# Patient Record
Sex: Female | Born: 1965
Health system: Southern US, Community
[De-identification: ages and names within clinical notes are randomized; demographics above are authoritative.]

## PROBLEM LIST (undated history)

## (undated) DIAGNOSIS — N183 Chronic kidney disease, stage 3 unspecified: Secondary | ICD-10-CM

## (undated) DIAGNOSIS — Z720 Tobacco use: Secondary | ICD-10-CM

## (undated) DIAGNOSIS — F419 Anxiety disorder, unspecified: Secondary | ICD-10-CM

## (undated) DIAGNOSIS — I639 Cerebral infarction, unspecified: Secondary | ICD-10-CM

## (undated) DIAGNOSIS — I429 Cardiomyopathy, unspecified: Secondary | ICD-10-CM

## (undated) DIAGNOSIS — K219 Gastro-esophageal reflux disease without esophagitis: Secondary | ICD-10-CM

## (undated) DIAGNOSIS — F329 Major depressive disorder, single episode, unspecified: Secondary | ICD-10-CM

## (undated) DIAGNOSIS — F32A Depression, unspecified: Secondary | ICD-10-CM

## (undated) DIAGNOSIS — J45909 Unspecified asthma, uncomplicated: Secondary | ICD-10-CM

## (undated) DIAGNOSIS — I10 Essential (primary) hypertension: Secondary | ICD-10-CM

## (undated) DIAGNOSIS — J189 Pneumonia, unspecified organism: Secondary | ICD-10-CM

## (undated) DIAGNOSIS — M1990 Unspecified osteoarthritis, unspecified site: Secondary | ICD-10-CM

## (undated) HISTORY — DX: Cerebral infarction, unspecified: I63.9

## (undated) HISTORY — DX: Chronic kidney disease, stage 3 (moderate): N18.3

## (undated) HISTORY — DX: Cardiomyopathy, unspecified: I42.9

## (undated) HISTORY — DX: Tobacco use: Z72.0

## (undated) HISTORY — PX: ABDOMINAL HYSTERECTOMY: SHX81

## (undated) HISTORY — DX: Morbid (severe) obesity due to excess calories: E66.01

## (undated) HISTORY — DX: Chronic kidney disease, stage 3 unspecified: N18.30

---

## 2012-08-21 ENCOUNTER — Encounter (HOSPITAL_COMMUNITY): Payer: Self-pay | Admitting: *Deleted

## 2012-08-21 ENCOUNTER — Emergency Department (HOSPITAL_COMMUNITY)
Admission: EM | Admit: 2012-08-21 | Discharge: 2012-08-21 | Disposition: A | Discharge status: Home / Self Care | Payer: Medicaid Other | Attending: Emergency Medicine | Admitting: Emergency Medicine

## 2012-08-21 DIAGNOSIS — F17200 Nicotine dependence, unspecified, uncomplicated: Secondary | ICD-10-CM | POA: Insufficient documentation

## 2012-08-21 DIAGNOSIS — X58XXXA Exposure to other specified factors, initial encounter: Secondary | ICD-10-CM | POA: Insufficient documentation

## 2012-08-21 DIAGNOSIS — J45909 Unspecified asthma, uncomplicated: Secondary | ICD-10-CM | POA: Insufficient documentation

## 2012-08-21 DIAGNOSIS — S0500XA Injury of conjunctiva and corneal abrasion without foreign body, unspecified eye, initial encounter: Secondary | ICD-10-CM

## 2012-08-21 DIAGNOSIS — S058X9A Other injuries of unspecified eye and orbit, initial encounter: Secondary | ICD-10-CM | POA: Insufficient documentation

## 2012-08-21 DIAGNOSIS — Z888 Allergy status to other drugs, medicaments and biological substances status: Secondary | ICD-10-CM | POA: Insufficient documentation

## 2012-08-21 DIAGNOSIS — I10 Essential (primary) hypertension: Secondary | ICD-10-CM | POA: Insufficient documentation

## 2012-08-21 HISTORY — DX: Essential (primary) hypertension: I10

## 2012-08-21 HISTORY — DX: Unspecified asthma, uncomplicated: J45.909

## 2012-08-21 MED ORDER — TETRACAINE HCL 0.5 % OP SOLN
2.0000 [drp] | Freq: Once | OPHTHALMIC | Status: AC
  Administered 2012-08-21: 2 [drp] via OPHTHALMIC
  Filled 2012-08-21: qty 2

## 2012-08-21 MED ORDER — HYDROCODONE-ACETAMINOPHEN 5-325 MG PO TABS
1.0000 | ORAL_TABLET | Freq: Once | ORAL | Status: AC
  Administered 2012-08-21: 1 via ORAL
  Filled 2012-08-21: qty 1

## 2012-08-21 MED ORDER — FLUORESCEIN SODIUM 1 MG OP STRP
1.0000 | ORAL_STRIP | Freq: Once | OPHTHALMIC | Status: AC
  Administered 2012-08-21: 1 via OPHTHALMIC
  Filled 2012-08-21: qty 1

## 2012-08-21 MED ORDER — HYDROCODONE-ACETAMINOPHEN 5-325 MG PO TABS: 1.0000 | ORAL_TABLET | ORAL | Status: DC | PRN

## 2012-08-21 MED ORDER — TOBRAMYCIN 0.3 % OP SOLN
2.0000 [drp] | Freq: Four times a day (QID) | OPHTHALMIC | Status: DC
  Administered 2012-08-21: 2 [drp] via OPHTHALMIC
  Filled 2012-08-21: qty 5

## 2012-08-21 NOTE — ED Notes (Signed)
Pt c/o left eye pain and irritation since yesterday, worsened tonight and woke her from sleep

## 2012-08-21 NOTE — ED Provider Notes (Signed)
Medical screening examination/treatment/procedure(s) were performed by non-physician practitioner and as supervising physician I was immediately available for consultation/collaboration.  Olivia Mackie, MD 08/21/12 [phone]

## 2012-08-21 NOTE — ED Provider Notes (Signed)
History     CSN: [id number]  Arrival date & time 08/21/12  0405   First MD Initiated Contact with Patient 08/21/12 [phone]      Chief Complaint  Patient presents with  . Eye Pain   HPI  History provided by the patient. Patient is a 46 year old female with history of hypertension and asthma who presents with complaints of left eye pain and irritation. Patient reports having some irritation of her left eye yesterday with slight itching. She states she does remember rubbing and scratching at her eye. Later in the evening she began having increasing pain to her and redness. Pain is worse with any bright lights. Patient states she was not sure what might cause her symptoms but that it could of been a strong air pressure in the car or possibly from her mascara. Patient does wear glasses does not use contact lenses. She has associated tearing and drainage. She denies any other aggravating or alleviating factors. There is some blurred vision from drainage and pain.      Past Medical History  Diagnosis Date  . Hypertension   . Asthma     Past Surgical History  Procedure Date  . Abdominal hysterectomy   . Cesarean section     History reviewed. No pertinent family history.  History  Substance Use Topics  . Smoking status: Current Every Day Smoker -- 1.0 packs/day    Types: Cigarettes  . Smokeless tobacco: Not on file  . Alcohol Use: Yes     occ    OB History    Grav Para Term Preterm Abortions TAB SAB Ect Mult Living                  Review of Systems  Constitutional: Negative for fever and chills.  HENT: Positive for rhinorrhea. Negative for congestion and sore throat.   Eyes: Positive for photophobia, pain, discharge, redness and itching. Negative for visual disturbance.  Gastrointestinal: Negative for nausea, vomiting and abdominal pain.  Neurological: Positive for headaches.    Allergies  Altace and Aspirin  Home Medications   Current Outpatient Rx  Name Route  Sig Dispense Refill  . PRESCRIPTION MEDICATION Oral Take 1 tablet by mouth 2 (two) times daily. Patient takes metoprolol and doesn't know what strength.  She doesn't have a pharmacy she uses regularly      BP 186/106  Pulse 77  Temp 98 F (36.7 C)  Resp 20  SpO2 100%  Physical Exam  Nursing note and vitals reviewed. Constitutional: She is oriented to person, place, and time. She appears well-developed and well-nourished. No distress.  HENT:  Head: Normocephalic.  Eyes: EOM are normal. Pupils are equal, round, and reactive to light. No foreign bodies found. Left eye exhibits discharge. Left eye exhibits no chemosis. Right conjunctiva is not injected. Right conjunctiva has no hemorrhage. Left conjunctiva is injected. Left conjunctiva has no hemorrhage.  Slit lamp exam:      The left eye shows fluorescein uptake.         General hazyness over central left cornea with fluorescein uptake.  Cardiovascular: Normal rate and regular rhythm.   No murmur heard. Pulmonary/Chest: Effort normal. No respiratory distress. She has no wheezes. She has no rales.  Neurological: She is alert and oriented to person, place, and time.  Skin: Skin is warm and dry. No rash noted.  Psychiatric: She has a normal mood and affect. Her behavior is normal.    ED Course  Procedures  1. Corneal abrasion       MDM  4:20AM patient seen and evaluated. Patient appears uncomfortable with irritation of left eye.        Angus Seller, [place] 08/21/12 [phone]

## 2012-11-09 ENCOUNTER — Encounter (HOSPITAL_COMMUNITY): Payer: Self-pay | Admitting: *Deleted

## 2012-11-09 ENCOUNTER — Emergency Department (HOSPITAL_COMMUNITY)
Admission: EM | Admit: 2012-11-09 | Discharge: 2012-11-09 | Disposition: A | Discharge status: Home / Self Care | Payer: Medicaid Other | Attending: Emergency Medicine | Admitting: Emergency Medicine

## 2012-11-09 DIAGNOSIS — Z79899 Other long term (current) drug therapy: Secondary | ICD-10-CM | POA: Insufficient documentation

## 2012-11-09 DIAGNOSIS — J45901 Unspecified asthma with (acute) exacerbation: Secondary | ICD-10-CM

## 2012-11-09 DIAGNOSIS — F17200 Nicotine dependence, unspecified, uncomplicated: Secondary | ICD-10-CM | POA: Insufficient documentation

## 2012-11-09 DIAGNOSIS — I10 Essential (primary) hypertension: Secondary | ICD-10-CM | POA: Insufficient documentation

## 2012-11-09 MED ORDER — ALBUTEROL SULFATE HFA 108 (90 BASE) MCG/ACT IN AERS
2.0000 | INHALATION_SPRAY | RESPIRATORY_TRACT | Status: DC | PRN
  Administered 2012-11-09: 2 via RESPIRATORY_TRACT
  Filled 2012-11-09: qty 6.7

## 2012-11-09 MED ORDER — AEROCHAMBER PLUS W/MASK MISC
Status: AC
  Administered 2012-11-09: 12:00:00
  Filled 2012-11-09: qty 1

## 2012-11-09 MED ORDER — ALBUTEROL SULFATE (5 MG/ML) 0.5% IN NEBU
5.0000 mg | INHALATION_SOLUTION | Freq: Once | RESPIRATORY_TRACT | Status: AC
  Administered 2012-11-09: 5 mg via RESPIRATORY_TRACT

## 2012-11-09 MED ORDER — ALBUTEROL SULFATE (5 MG/ML) 0.5% IN NEBU
INHALATION_SOLUTION | RESPIRATORY_TRACT | Status: AC
  Filled 2012-11-09: qty 1

## 2012-11-09 MED ORDER — PREDNISONE 20 MG PO TABS
60.0000 mg | ORAL_TABLET | Freq: Once | ORAL | Status: AC
  Administered 2012-11-09: 60 mg via ORAL
  Filled 2012-11-09: qty 3

## 2012-11-09 MED ORDER — OXYCODONE-ACETAMINOPHEN 5-325 MG PO TABS: 1.0000 | ORAL_TABLET | ORAL | Status: DC | PRN

## 2012-11-09 MED ORDER — ALBUTEROL SULFATE (2.5 MG/3ML) 0.083% IN NEBU: 5.0000 mg | INHALATION_SOLUTION | Freq: Four times a day (QID) | RESPIRATORY_TRACT | Status: DC | PRN

## 2012-11-09 MED ORDER — PREDNISONE 20 MG PO TABS: 40.0000 mg | ORAL_TABLET | Freq: Every day | ORAL | Status: DC

## 2012-11-09 NOTE — ED Provider Notes (Signed)
History     CSN: [id number]  Arrival date & time 11/09/12  1478   First MD Initiated Contact with Patient 11/09/12 [phone]      Chief Complaint  Patient presents with  . Asthma    (Consider location/radiation/quality/duration/timing/severity/associated sxs/prior treatment) Patient is a 46 y.o. female presenting with asthma. The history is provided by the patient. No language interpreter was used.  Asthma This is a recurrent problem. The current episode started yesterday. The problem occurs 2 to 4 times per day. The problem has been gradually worsening. Pertinent negatives include no abdominal pain, coughing, fever, nausea, sore throat, swollen glands or vomiting.   46 year old female coming in with her usual asthma attack. States that she had a restless night and started wheezing sometimes late afternoon yesterday. She's been using her albuterol inhaler with some relief. Denies fever cough nausea vomiting. Smoker. Hypertensive. States that she takes 1 Toprol a day and she supposed to be taking 2 a day. Taking 2 toprol makes her feel "weird". She will be going to the [facility] on January 9th.   Past Medical History  Diagnosis Date  . Hypertension   . Asthma     Past Surgical History  Procedure Date  . Abdominal hysterectomy   . Cesarean section     History reviewed. No pertinent family history.  History  Substance Use Topics  . Smoking status: Current Every Day Smoker -- 1.0 packs/day    Types: Cigarettes  . Smokeless tobacco: Not on file  . Alcohol Use: Yes     Comment: occ    OB History    Grav Para Term Preterm Abortions TAB SAB Ect Mult Living                  Review of Systems  Constitutional: Negative.  Negative for fever.  HENT: Negative.  Negative for sore throat.   Eyes: Negative.   Respiratory: Positive for wheezing. Negative for cough and shortness of breath.   Cardiovascular: Negative.   Gastrointestinal: Negative.  Negative for nausea, vomiting and  abdominal pain.  Neurological: Negative.   Psychiatric/Behavioral: Negative.   All other systems reviewed and are negative.    Allergies  Altace and Aspirin  Home Medications   Current Outpatient Rx  Name  Route  Sig  Dispense  Refill  . ALBUTEROL SULFATE HFA 108 (90 BASE) MCG/ACT IN AERS   Inhalation   Inhale 2 puffs into the lungs every 6 (six) hours as needed. For pain         . METOPROLOL SUCCINATE ER 100 MG PO TB24   Oral   Take 100 mg by mouth 2 (two) times daily. Take with or immediately following a meal.         . ALBUTEROL SULFATE (2.5 MG/3ML) 0.083% IN NEBU   Nebulization   Take 6 mLs (5 mg total) by nebulization every 6 (six) hours as needed for wheezing.   75 mL   12     BP 186/113  Pulse 84  Temp 98.3 F (36.8 C) (Oral)  Resp 20  SpO2 96%  Physical Exam  Nursing note and vitals reviewed. Constitutional: She is oriented to person, place, and time. She appears well-developed and well-nourished.  HENT:  Head: Normocephalic and atraumatic.  Eyes: Conjunctivae normal and EOM are normal. Pupils are equal, round, and reactive to light.  Neck: Normal range of motion. Neck supple.  Cardiovascular: Normal rate.   Pulmonary/Chest: Effort normal. No respiratory distress. She has wheezes.  Scattered wheezing no acute distress  Abdominal: Soft.  Musculoskeletal: Normal range of motion. She exhibits no edema and no tenderness.  Neurological: She is alert and oriented to person, place, and time. She has normal reflexes.  Skin: Skin is warm and dry.  Psychiatric: She has a normal mood and affect.    ED Course  Procedures (including critical care time)  Labs Reviewed - No data to display No results found.   No diagnosis found.    MDM  Asthma attack. Feels better after neb treatment and prednisone.  rx for prednsone and new albuterol inhaler in the ER.Will follow up with [facility] on the 9th.  She will take her toprol as directed (2 a day  instead of 1 a day) for her hypertension. She understands to return for worsening symptoms.        Remi Haggard, NP 11/10/12 1150

## 2012-11-09 NOTE — ED Notes (Signed)
Reports hx of asthma that has become more severe recently and no relief with inhalers. Airway is intact at triage, able to speak in full sentences.

## 2012-11-10 NOTE — ED Provider Notes (Signed)
Medical screening examination/treatment/procedure(s) were performed by non-physician practitioner and as supervising physician I was immediately available for consultation/collaboration.   Chance Munter L Wahid Holley, MD 11/10/12 1743 

## 2012-12-31 ENCOUNTER — Encounter (HOSPITAL_COMMUNITY): Payer: Self-pay | Admitting: Emergency Medicine

## 2012-12-31 DIAGNOSIS — J3489 Other specified disorders of nose and nasal sinuses: Secondary | ICD-10-CM | POA: Insufficient documentation

## 2012-12-31 DIAGNOSIS — J45909 Unspecified asthma, uncomplicated: Secondary | ICD-10-CM | POA: Insufficient documentation

## 2012-12-31 DIAGNOSIS — R0602 Shortness of breath: Principal | ICD-10-CM | POA: Insufficient documentation

## 2012-12-31 DIAGNOSIS — F17200 Nicotine dependence, unspecified, uncomplicated: Secondary | ICD-10-CM | POA: Insufficient documentation

## 2012-12-31 DIAGNOSIS — I10 Essential (primary) hypertension: Secondary | ICD-10-CM | POA: Insufficient documentation

## 2012-12-31 MED ORDER — IPRATROPIUM BROMIDE 0.02 % IN SOLN
0.5000 mg | Freq: Once | RESPIRATORY_TRACT | Status: AC
  Administered 2012-12-31: 0.5 mg via RESPIRATORY_TRACT

## 2012-12-31 MED ORDER — IPRATROPIUM BROMIDE 0.02 % IN SOLN
RESPIRATORY_TRACT | Status: AC
  Administered 2012-12-31: 0.5 mg via RESPIRATORY_TRACT
  Filled 2012-12-31: qty 2.5

## 2012-12-31 MED ORDER — ALBUTEROL SULFATE (5 MG/ML) 0.5% IN NEBU
INHALATION_SOLUTION | RESPIRATORY_TRACT | Status: AC
  Administered 2012-12-31: 5 mg via RESPIRATORY_TRACT
  Filled 2012-12-31: qty 1

## 2012-12-31 MED ORDER — ALBUTEROL SULFATE (5 MG/ML) 0.5% IN NEBU
5.0000 mg | INHALATION_SOLUTION | Freq: Once | RESPIRATORY_TRACT | Status: AC
  Administered 2012-12-31: 5 mg via RESPIRATORY_TRACT

## 2012-12-31 NOTE — ED Notes (Signed)
Patient complaining of shortness of breath, wheezing, chest congestion, cough (productive with small, white sputum production) and nasal congestion that started today.  Denies chest pain.  Reports nausea, vomiting, and diarrhea yesterday.

## 2013-01-01 ENCOUNTER — Emergency Department (HOSPITAL_COMMUNITY)
Admit: 2013-01-01 | Discharge: 2013-01-01 | Disposition: A | Discharge status: Home / Self Care | Payer: Medicaid Other | Attending: Emergency Medicine | Admitting: Emergency Medicine

## 2013-01-01 ENCOUNTER — Telehealth (HOSPITAL_COMMUNITY): Payer: Self-pay | Admitting: Emergency Medicine

## 2013-01-01 ENCOUNTER — Observation Stay (HOSPITAL_COMMUNITY)
Admission: EM | Admit: 2013-01-01 | Discharge: 2013-01-01 | Disposition: A | Discharge status: Home / Self Care | Payer: Medicaid Other | Attending: Emergency Medicine | Admitting: Emergency Medicine

## 2013-01-01 DIAGNOSIS — J189 Pneumonia, unspecified organism: Secondary | ICD-10-CM

## 2013-01-01 LAB — CBC
Hemoglobin: 12.5 g/dL (ref 12.0–15.0)
MCH: 27.6 pg (ref 26.0–34.0)
MCHC: 33.2 g/dL (ref 30.0–36.0)
RDW: 16.5 % — ABNORMAL HIGH (ref 11.5–15.5)

## 2013-01-01 LAB — POCT I-STAT, CHEM 8
Creatinine, Ser: 1.9 mg/dL — ABNORMAL HIGH (ref 0.50–1.10)
Glucose, Bld: 99 mg/dL (ref 70–99)
HCT: 38 % (ref 36.0–46.0)
Hemoglobin: 12.9 g/dL (ref 12.0–15.0)
Potassium: 3.1 mEq/L — ABNORMAL LOW (ref 3.5–5.1)
Sodium: 142 mEq/L (ref 135–145)
TCO2: 26 mmol/L (ref 0–100)

## 2013-01-01 MED ORDER — LEVOFLOXACIN IN D5W 500 MG/100ML IV SOLN
500.0000 mg | Freq: Once | INTRAVENOUS | Status: AC
  Administered 2013-01-01: 500 mg via INTRAVENOUS
  Filled 2013-01-01: qty 100

## 2013-01-01 MED ORDER — SODIUM CHLORIDE 0.9 % IV SOLN: INTRAVENOUS | Status: DC

## 2013-01-01 MED ORDER — METHYLPREDNISOLONE SODIUM SUCC 125 MG IJ SOLR
125.0000 mg | Freq: Once | INTRAMUSCULAR | Status: AC
  Administered 2013-01-01: 125 mg via INTRAVENOUS
  Filled 2013-01-01: qty 2

## 2013-01-01 MED ORDER — PREDNISONE 20 MG PO TABS: 60.0000 mg | ORAL_TABLET | Freq: Every day | ORAL | Status: DC

## 2013-01-01 MED ORDER — ALBUTEROL SULFATE (5 MG/ML) 0.5% IN NEBU
5.0000 mg | INHALATION_SOLUTION | Freq: Once | RESPIRATORY_TRACT | Status: AC
  Administered 2013-01-01: 5 mg via RESPIRATORY_TRACT
  Filled 2013-01-01: qty 1

## 2013-01-01 MED ORDER — MOXIFLOXACIN HCL 400 MG PO TABS: 400.0000 mg | ORAL_TABLET | Freq: Every day | ORAL | Status: DC

## 2013-01-01 NOTE — ED Provider Notes (Addendum)
History     CSN: [id number]  Arrival date & time 12/31/12  2259   First MD Initiated Contact with Patient 01/01/13 [phone]      Chief Complaint  Patient presents with  . Shortness of Breath  . Nasal Congestion    (Consider location/radiation/quality/duration/timing/severity/associated sxs/prior treatment) HPI HX per PT, followed by [facility] clinic for HTN and asthma. Feeling sick over the last 2 days with cough, wheezing and SOB. No sick contacts, no F/C, some congestion. No sore throat, rash or vomiting. Has been using her inhaler more today. No leg swelling. No CP. MOD in severity  Past Medical History  Diagnosis Date  . Hypertension   . Asthma     Past Surgical History  Procedure Laterality Date  . Abdominal hysterectomy    . Cesarean section      History reviewed. No pertinent family history.  History  Substance Use Topics  . Smoking status: Current Every Day Smoker -- 0.50 packs/day    Types: Cigarettes  . Smokeless tobacco: Not on file  . Alcohol Use: No     Comment: occ    OB History   Grav Para Term Preterm Abortions TAB SAB Ect Mult Living                  Review of Systems  Constitutional: Negative for fever and chills.  HENT: Positive for congestion. Negative for neck pain and neck stiffness.   Eyes: Negative for pain.  Respiratory: Positive for cough, shortness of breath and wheezing.   Cardiovascular: Negative for chest pain.  Gastrointestinal: Negative for vomiting and abdominal pain.  Genitourinary: Negative for dysuria.  Musculoskeletal: Negative for back pain.  Skin: Negative for rash.  Neurological: Negative for headaches.  All other systems reviewed and are negative.    Allergies  Altace and Aspirin  Home Medications   Current Outpatient Rx  Name  Route  Sig  Dispense  Refill  . albuterol (PROVENTIL HFA;VENTOLIN HFA) 108 (90 BASE) MCG/ACT inhaler   Inhalation   Inhale 2 puffs into the lungs every 6 (six) hours as needed. For pain          . albuterol (PROVENTIL) (2.5 MG/3ML) 0.083% nebulizer solution   Nebulization   Take 6 mLs (5 mg total) by nebulization every 6 (six) hours as needed for wheezing.   75 mL   12   . metoprolol succinate (TOPROL-XL) 100 MG 24 hr tablet   Oral   Take 100 mg by mouth 2 (two) times daily. Take with or immediately following a meal.         . predniSONE (DELTASONE) 20 MG tablet   Oral   Take 2 tablets (40 mg total) by mouth daily.   10 tablet   0     Dispense as written.     BP 168/79  Pulse 84  Temp(Src) 98.8 F (37.1 C) (Oral)  Resp 14  SpO2 98%  Physical Exam  Constitutional: She is oriented to person, place, and time. She appears well-developed and well-nourished.  HENT:  Head: Normocephalic and atraumatic.  Mouth/Throat: Oropharynx is clear and moist. No oropharyngeal exudate.  Eyes: Conjunctivae and EOM are normal. Pupils are equal, round, and reactive to light.  Neck: Neck supple. No tracheal deviation present.  Cardiovascular: Regular rhythm and intact distal pulses.   Pulmonary/Chest: Effort normal. No stridor. No respiratory distress. She exhibits no tenderness.  Dec bilateral breath sounds with exp wheezes  Abdominal: Soft. Bowel sounds are normal. She exhibits  no distension. There is no tenderness.  Musculoskeletal: Normal range of motion. She exhibits no edema.  Neurological: She is alert and oriented to person, place, and time.  Skin: Skin is warm and dry.    ED Course  Procedures (including critical care time)  Results for orders placed during the hospital encounter of 01/01/13  CBC      Result Value Range   WBC 7.0  4.0 - 10.5 K/uL   RBC 4.53  3.87 - 5.11 MIL/uL   Hemoglobin 12.5  12.0 - 15.0 g/dL   HCT 40.9  81.1 - 91.4 %   MCV 83.2  78.0 - 100.0 fL   MCH 27.6  26.0 - 34.0 pg   MCHC 33.2  30.0 - 36.0 g/dL   RDW 78.2 (*) 95.6 - 21.3 %   Platelets 224  150 - 400 K/uL  POCT I-STAT, CHEM 8      Result Value Range   Sodium 142  135 - 145  mEq/L   Potassium 3.1 (*) 3.5 - 5.1 mEq/L   Chloride 104  96 - 112 mEq/L   BUN 18  6 - 23 mg/dL   Creatinine, Ser 0.86 (*) 0.50 - 1.10 mg/dL   Glucose, Bld 99  70 - 99 mg/dL   Calcium, Ion 5.78 (*) 1.12 - 1.23 mmol/L   TCO2 26  0 - 100 mmol/L   Hemoglobin 12.9  12.0 - 15.0 g/dL   HCT 46.9  62.9 - 52.8 %   Dg Chest 2 View  01/01/2013  *RADIOLOGY REPORT*  Clinical Data: Chest pain, shortness of breath, wheezing, and congestion.  CHEST - 2 VIEW  Comparison: None.  Findings: Cardiac enlargement with normal pulmonary vascularity. There is airspace disease in the right middle lung with obscuration of the right heart border suggesting infiltration or atelectasis. Changes may represent focal pneumonia.  No blunting of costophrenic angles.  No pneumothorax.  No evidence of edema.  Mediastinal contours appear intact.  IMPRESSION: Diffuse cardiac enlargement without significant vascular congestion or edema.  Infiltration or atelectasis in the right mid lung. Consider pneumonia.   Original Report Authenticated By: Burman Nieves, M.D.     IV ABx for infiltrate CXR Albuterol and steroids provided  3:01 AM improved breathing still some exp wheezes, repeat albuterol Tx.   Recheck still some wheezing RA pulse ox 92% with ambulation. MED consult for admit 5:42 AM d/w Dr Julian Reil - plan admit MDM  CAP with wheezing and borderline hypoxia  CXR, labs, breathing Treatments, IV ABx        Sunnie Nielsen, MD 01/01/13 0543   6:10 AM patient prefers not to be admitted to the hospital. She agrees to take antibiotics, steroids and followup with her primary care physician. She agrees to return for any worsening condition  Sunnie Nielsen, MD 01/01/13 [phone]

## 2013-01-01 NOTE — ED Notes (Signed)
Pharmacy call requesting change in Avelox RX, not covered by insurance. T.O. Dr Effie Shy, change RX to Levaquin 750 mg PO daily for seven days, QS, no refills. Levaquin RX called to [facility] [phone] staff @ 1045.

## 2013-11-02 ENCOUNTER — Emergency Department (HOSPITAL_COMMUNITY)
Admission: EM | Admit: 2013-11-02 | Discharge: 2013-11-02 | Disposition: A | Discharge status: Home / Self Care | Payer: Medicaid Other | Source: Home / Self Care | Attending: Family Medicine | Admitting: Family Medicine

## 2013-11-02 ENCOUNTER — Encounter (HOSPITAL_COMMUNITY): Payer: Self-pay | Admitting: Emergency Medicine

## 2013-11-02 DIAGNOSIS — J209 Acute bronchitis, unspecified: Secondary | ICD-10-CM

## 2013-11-02 DIAGNOSIS — J208 Acute bronchitis due to other specified organisms: Secondary | ICD-10-CM

## 2013-11-02 MED ORDER — ALBUTEROL SULFATE (5 MG/ML) 0.5% IN NEBU
INHALATION_SOLUTION | RESPIRATORY_TRACT | Status: AC
  Filled 2013-11-02: qty 1

## 2013-11-02 MED ORDER — ALBUTEROL SULFATE (5 MG/ML) 0.5% IN NEBU
5.0000 mg | INHALATION_SOLUTION | Freq: Once | RESPIRATORY_TRACT | Status: AC
  Administered 2013-11-02: 5 mg via RESPIRATORY_TRACT

## 2013-11-02 MED ORDER — IPRATROPIUM BROMIDE 0.02 % IN SOLN
RESPIRATORY_TRACT | Status: AC
  Filled 2013-11-02: qty 2.5

## 2013-11-02 MED ORDER — PREDNISONE 50 MG PO TABS: 50.0000 mg | ORAL_TABLET | Freq: Every day | ORAL | Status: DC

## 2013-11-02 MED ORDER — ALBUTEROL SULFATE HFA 108 (90 BASE) MCG/ACT IN AERS: 2.0000 | INHALATION_SPRAY | Freq: Four times a day (QID) | RESPIRATORY_TRACT | Status: DC | PRN

## 2013-11-02 MED ORDER — IPRATROPIUM BROMIDE 0.02 % IN SOLN
0.5000 mg | Freq: Once | RESPIRATORY_TRACT | Status: AC
  Administered 2013-11-02: 0.5 mg via RESPIRATORY_TRACT

## 2013-11-02 MED ORDER — HYDROCODONE-ACETAMINOPHEN 5-325 MG PO TABS: 0.5000 | ORAL_TABLET | Freq: Every evening | ORAL | Status: DC | PRN

## 2013-11-02 NOTE — ED Provider Notes (Signed)
Andrea Clements is a 47 y.o. female who presents to Urgent Care today for on the evening of cough congestion shortness of breath and wheezing. This is associated with mild body aches. Patient has not tried any medications yet. The cough is mildly productive of villous sputum. No nausea vomiting or diarrhea. No chest pains or palpitations. Patient has a medical history significant for asthma.   Past Medical History  Diagnosis Date  . Hypertension   . Asthma    History  Substance Use Topics  . Smoking status: Current Every Day Smoker -- 0.50 packs/day    Types: Cigarettes  . Smokeless tobacco: Not on file  . Alcohol Use: No     Comment: occ   ROS as above Medications reviewed. No current facility-administered medications for this encounter.   Current Outpatient Prescriptions  Medication Sig Dispense Refill  . albuterol (PROVENTIL HFA;VENTOLIN HFA) 108 (90 BASE) MCG/ACT inhaler Inhale 2 puffs into the lungs every 6 (six) hours as needed for wheezing or shortness of breath.       Marland Kitchen amLODipine (NORVASC) 5 MG tablet Take 5 mg by mouth daily.      . indapamide (LOZOL) 1.25 MG tablet Take 1.25 mg by mouth daily.      . metoprolol succinate (TOPROL-XL) 100 MG 24 hr tablet Take 100 mg by mouth 2 (two) times daily. Take with or immediately following a meal.      . albuterol (PROVENTIL) (2.5 MG/3ML) 0.083% nebulizer solution Take 6 mLs (5 mg total) by nebulization every 6 (six) hours as needed for wheezing.  75 mL  12  . moxifloxacin (AVELOX) 400 MG tablet Take 1 tablet (400 mg total) by mouth daily.  7 tablet  0  . potassium chloride (K-DUR) 10 MEQ tablet Take 10 mEq by mouth daily.      . predniSONE (DELTASONE) 20 MG tablet Take 3 tablets (60 mg total) by mouth daily.  15 tablet  0    Exam:  BP 138/49  Pulse 108  Temp(Src) 100.1 F (37.8 C) (Oral)  Resp 24  SpO2 93% Oxygen saturation: Nebulizer treatment was 96% on room air Gen: Well NAD obese HEENT: EOMI,  MMM Lungs: Increased work  of breathing and expiratory wheezing present bilaterally Heart: RRR no MRG Abd: NABS, Soft. NT, ND Exts: Non edematous BL  LE, warm and well perfused.   Patient was given a DuoNeb nebulizer treatment and had considerable improvement in symptoms and normalization of her lung exam    Assessment and Plan: 47 y.o. female with viral bronchitis. Plan for treatment with prednisone, hydrocodone-based cough suppressant, and albuterol. Discussed warning signs or symptoms. Please see discharge instructions. Patient expresses understanding.      Rodolph Bong, MD 11/02/13 [phone]

## 2013-11-02 NOTE — ED Notes (Signed)
47 yr old is with complaints of SOB, wheezing, chest congestion, Cough-yellow x last night,

## 2014-10-02 ENCOUNTER — Observation Stay (HOSPITAL_COMMUNITY): Payer: Medicaid Other

## 2014-10-02 ENCOUNTER — Inpatient Hospital Stay (HOSPITAL_COMMUNITY)
Admission: EM | Admit: 2014-10-02 | Discharge: 2014-10-04 | DRG: 065 | Disposition: A | Discharge status: Home / Self Care | Payer: Medicaid Other | Attending: Internal Medicine | Admitting: Internal Medicine

## 2014-10-02 ENCOUNTER — Emergency Department (HOSPITAL_COMMUNITY): Payer: Medicaid Other

## 2014-10-02 ENCOUNTER — Encounter (HOSPITAL_COMMUNITY): Payer: Self-pay | Admitting: Emergency Medicine

## 2014-10-02 DIAGNOSIS — I429 Cardiomyopathy, unspecified: Secondary | ICD-10-CM | POA: Diagnosis present

## 2014-10-02 DIAGNOSIS — R531 Weakness: Secondary | ICD-10-CM | POA: Insufficient documentation

## 2014-10-02 DIAGNOSIS — N179 Acute kidney failure, unspecified: Secondary | ICD-10-CM | POA: Diagnosis present

## 2014-10-02 DIAGNOSIS — J45909 Unspecified asthma, uncomplicated: Secondary | ICD-10-CM | POA: Diagnosis present

## 2014-10-02 DIAGNOSIS — I739 Peripheral vascular disease, unspecified: Secondary | ICD-10-CM | POA: Diagnosis present

## 2014-10-02 DIAGNOSIS — Z7902 Long term (current) use of antithrombotics/antiplatelets: Secondary | ICD-10-CM

## 2014-10-02 DIAGNOSIS — I5040 Unspecified combined systolic (congestive) and diastolic (congestive) heart failure: Secondary | ICD-10-CM | POA: Diagnosis present

## 2014-10-02 DIAGNOSIS — I639 Cerebral infarction, unspecified: Principal | ICD-10-CM | POA: Diagnosis present

## 2014-10-02 DIAGNOSIS — E876 Hypokalemia: Secondary | ICD-10-CM | POA: Diagnosis present

## 2014-10-02 DIAGNOSIS — F17210 Nicotine dependence, cigarettes, uncomplicated: Secondary | ICD-10-CM | POA: Diagnosis present

## 2014-10-02 DIAGNOSIS — Z6841 Body Mass Index (BMI) 40.0 and over, adult: Secondary | ICD-10-CM

## 2014-10-02 DIAGNOSIS — Z886 Allergy status to analgesic agent status: Secondary | ICD-10-CM

## 2014-10-02 DIAGNOSIS — I10 Essential (primary) hypertension: Secondary | ICD-10-CM | POA: Diagnosis present

## 2014-10-02 DIAGNOSIS — E669 Obesity, unspecified: Secondary | ICD-10-CM | POA: Diagnosis present

## 2014-10-02 DIAGNOSIS — G8191 Hemiplegia, unspecified affecting right dominant side: Secondary | ICD-10-CM | POA: Diagnosis present

## 2014-10-02 LAB — COMPREHENSIVE METABOLIC PANEL
ALBUMIN: 3.8 g/dL (ref 3.5–5.2)
ALK PHOS: 81 U/L (ref 39–117)
ALT: 19 U/L (ref 0–35)
AST: 18 U/L (ref 0–37)
Anion gap: 14 (ref 5–15)
BILIRUBIN TOTAL: 0.3 mg/dL (ref 0.3–1.2)
BUN: 19 mg/dL (ref 6–23)
CHLORIDE: 98 meq/L (ref 96–112)
CO2: 27 mEq/L (ref 19–32)
Calcium: 9.3 mg/dL (ref 8.4–10.5)
Creatinine, Ser: 1.75 mg/dL — ABNORMAL HIGH (ref 0.50–1.10)
GFR calc Af Amer: 39 mL/min — ABNORMAL LOW (ref >90)
GFR calc non Af Amer: 34 mL/min — ABNORMAL LOW (ref >90)
Glucose, Bld: 101 mg/dL — ABNORMAL HIGH (ref 70–99)
POTASSIUM: 3 meq/L — AB (ref 3.7–5.3)
SODIUM: 139 meq/L (ref 137–147)
Total Protein: 8.2 g/dL (ref 6.0–8.3)

## 2014-10-02 LAB — CBC
HCT: 43.1 % (ref 36.0–46.0)
HEMOGLOBIN: 14.3 g/dL (ref 12.0–15.0)
MCH: 29.2 pg (ref 26.0–34.0)
MCHC: 33.2 g/dL (ref 30.0–36.0)
MCV: 88.1 fL (ref 78.0–100.0)
PLATELETS: 225 10*3/uL (ref 150–400)
RBC: 4.89 MIL/uL (ref 3.87–5.11)
RDW: 15.9 % — AB (ref 11.5–15.5)
WBC: 5.2 10*3/uL (ref 4.0–10.5)

## 2014-10-02 LAB — I-STAT TROPONIN, ED: Troponin i, poc: 0.05 ng/mL (ref 0.00–0.08)

## 2014-10-02 LAB — PROTIME-INR
INR: 0.98 (ref 0.00–1.49)
Prothrombin Time: 13.1 seconds (ref 11.6–15.2)

## 2014-10-02 LAB — MAGNESIUM: MAGNESIUM: 2.1 mg/dL (ref 1.5–2.5)

## 2014-10-02 LAB — DIFFERENTIAL
BASOS ABS: 0 10*3/uL (ref 0.0–0.1)
BASOS PCT: 0 % (ref 0–1)
Eosinophils Absolute: 0.3 10*3/uL (ref 0.0–0.7)
Eosinophils Relative: 6 % — ABNORMAL HIGH (ref 0–5)
Lymphocytes Relative: 31 % (ref 12–46)
Lymphs Abs: 1.6 10*3/uL (ref 0.7–4.0)
Monocytes Absolute: 0.6 10*3/uL (ref 0.1–1.0)
Monocytes Relative: 11 % (ref 3–12)
NEUTROS ABS: 2.7 10*3/uL (ref 1.7–7.7)
NEUTROS PCT: 52 % (ref 43–77)

## 2014-10-02 LAB — APTT: APTT: 33 s (ref 24–37)

## 2014-10-02 LAB — CBG MONITORING, ED: GLUCOSE-CAPILLARY: 102 mg/dL — AB (ref 70–99)

## 2014-10-02 MED ORDER — ALBUTEROL SULFATE (2.5 MG/3ML) 0.083% IN NEBU: 5.0000 mg | INHALATION_SOLUTION | Freq: Four times a day (QID) | RESPIRATORY_TRACT | Status: DC | PRN

## 2014-10-02 MED ORDER — POTASSIUM CHLORIDE CRYS ER 20 MEQ PO TBCR
40.0000 meq | EXTENDED_RELEASE_TABLET | Freq: Once | ORAL | Status: AC
Start: 2014-10-02 — End: 2014-10-03
  Administered 2014-10-03: 40 meq via ORAL
  Filled 2014-10-02: qty 2

## 2014-10-02 MED ORDER — SENNOSIDES-DOCUSATE SODIUM 8.6-50 MG PO TABS: 1.0000 | ORAL_TABLET | Freq: Every evening | ORAL | Status: DC | PRN

## 2014-10-02 MED ORDER — METOPROLOL SUCCINATE ER 100 MG PO TB24
100.0000 mg | ORAL_TABLET | Freq: Two times a day (BID) | ORAL | Status: DC
  Administered 2014-10-03 – 2014-10-04 (×4): 100 mg via ORAL
  Filled 2014-10-02 (×4): qty 1

## 2014-10-02 MED ORDER — ENOXAPARIN SODIUM 30 MG/0.3ML ~~LOC~~ SOLN
30.0000 mg | Freq: Every day | SUBCUTANEOUS | Status: DC
  Administered 2014-10-03 (×2): 30 mg via SUBCUTANEOUS
  Filled 2014-10-02 (×2): qty 0.3

## 2014-10-02 MED ORDER — POTASSIUM CHLORIDE 10 MEQ/100ML IV SOLN
10.0000 meq | INTRAVENOUS | Status: AC
  Administered 2014-10-02 (×2): 10 meq via INTRAVENOUS
  Filled 2014-10-02 (×2): qty 100

## 2014-10-02 MED ORDER — STROKE: EARLY STAGES OF RECOVERY BOOK
Freq: Once | Status: AC
  Administered 2014-10-03: 18:00:00
  Filled 2014-10-02 (×2): qty 1

## 2014-10-02 MED ORDER — ALBUTEROL SULFATE HFA 108 (90 BASE) MCG/ACT IN AERS: 2.0000 | INHALATION_SPRAY | Freq: Four times a day (QID) | RESPIRATORY_TRACT | Status: DC | PRN

## 2014-10-02 NOTE — Progress Notes (Signed)
  CARE MANAGEMENT ED NOTE 10/02/2014  Patient:  BUFORD, CATER   Account Number:  [id number]  Date Initiated:  10/02/2014  Documentation initiated by:  Radford Pax  Subjective/Objective Assessment:   Patient presents to the ED with right sided weakness and right facial numbeness.     Subjective/Objective Assessment Detail:   Patient with pmhx of HTN, asthma     Action/Plan:   Patient transfer to [facility]   Action/Plan Detail:   Anticipated DC Date:       Status Recommendation to Physician:   Result of Recommendation:    Other ED Services  Consult Working Plan    DC Planning Services  Other  PCP issues    Choice offered to / List presented to:            Status of service:  Completed, signed off  ED Comments:   ED Comments Detail:  EDCM spoke to patient at bedside.  Patient reoprts her pcp is Dr. Jackquline Denmark at the [facility].  Patient reports she has not seen her pcp is awhile.  [facility] encouraged patient to make a follow up viit with her pcp when she is discharged.  Patient verbalized understanding.  No further EDCM needs at this time.

## 2014-10-02 NOTE — ED Notes (Signed)
Patient transported to MRI 

## 2014-10-02 NOTE — ED Notes (Signed)
Pt reports R sided weakness that began last night and has progressively gotten worse. Slight drift to R arm, no facial droop. Pt ambulatory to triage room. Pt reports R side facial numbness.

## 2014-10-02 NOTE — ED Provider Notes (Signed)
CSN: [id number]     Arrival date & time 10/02/14  1704 History   First MD Initiated Contact with Patient 10/02/14 1756     Chief Complaint  Patient presents with  . Extremity Weakness   Andrea Clements is a 48 y.o. female with history of asthma and hypertension who presents to the ED complaining of right-sided weakness and right facial numbness since yesterday at 2 PM or about 28 hours prior to arrival. She reports she first started noticing some right leg weakness yesterday at 2 PM as well as some right arm weakness. She feels like her right hand is not coordinated. She reports this is been constant since onset. She denies numbness or tingling. She denies pain. She denies personal or family history of CVAs or TIAs. Patient reports she is recovering from a cold that started 5 days ago. She reports some nasal congestion and a slight cough that has been improving. The patient is ambulatory in the room. Patient reports she does not have difficulty walking she feels like her right foot is dropping. She denies fevers, chills, numbness, tingling, loss of bowel or bladder control, changes to her vision, trouble swallowing, trouble speaking, rashes, wheezing, shortness of breath, chest pain, palpitations, abdominal pain, or vomiting.   (Consider location/radiation/quality/duration/timing/severity/associated sxs/prior Treatment) HPI  Past Medical History  Diagnosis Date  . Hypertension   . Asthma    Past Surgical History  Procedure Laterality Date  . Abdominal hysterectomy    . Cesarean section     History reviewed. No pertinent family history. History  Substance Use Topics  . Smoking status: Current Every Day Smoker -- 0.50 packs/day    Types: Cigarettes  . Smokeless tobacco: Not on file  . Alcohol Use: No     Comment: occ   OB History    No data available     Review of Systems  Constitutional: Negative for fever, chills, appetite change and fatigue.  HENT: Positive for congestion and  sneezing. Negative for ear discharge, ear pain, sinus pressure, sore throat and trouble swallowing.   Eyes: Negative for pain, redness and visual disturbance.  Respiratory: Positive for cough. Negative for chest tightness, shortness of breath and wheezing.   Cardiovascular: Negative for chest pain, palpitations and leg swelling.  Gastrointestinal: Negative for nausea, vomiting, abdominal pain, diarrhea and blood in stool.  Genitourinary: Negative for dysuria, hematuria, decreased urine volume and difficulty urinating.  Musculoskeletal: Negative for back pain, neck pain and neck stiffness.  Skin: Negative for color change, pallor, rash and wound.  Neurological: Positive for weakness. Negative for speech difficulty and headaches.  All other systems reviewed and are negative.     Allergies  Altace and Aspirin  Home Medications   Prior to Admission medications   Medication Sig Start Date End Date Taking? Authorizing Provider  albuterol (PROVENTIL HFA;VENTOLIN HFA) 108 (90 BASE) MCG/ACT inhaler Inhale 2 puffs into the lungs every 6 (six) hours as needed for wheezing or shortness of breath. 11/02/13  Yes Rodolph BongEvan S Corey, MD  amLODipine (NORVASC) 5 MG tablet Take 5 mg by mouth daily.   Yes Historical Provider, MD  indapamide (LOZOL) 1.25 MG tablet Take 1.25 mg by mouth daily.   Yes Historical Provider, MD  metoprolol succinate (TOPROL-XL) 100 MG 24 hr tablet Take 100 mg by mouth 2 (two) times daily. Take with or immediately following a meal.   Yes Historical Provider, MD  albuterol (PROVENTIL) (2.5 MG/3ML) 0.083% nebulizer solution Take 6 mLs (5 mg total) by  nebulization every 6 (six) hours as needed for wheezing. 11/09/12   Remi Haggard, NP  HYDROcodone-acetaminophen (NORCO/VICODIN) 5-325 MG per tablet Take 0.5 tablets by mouth at bedtime as needed (cough). Patient not taking: Reported on 10/02/2014 11/02/13   Rodolph Bong, MD  predniSONE (DELTASONE) 50 MG tablet Take 1 tablet (50 mg total) by  mouth daily. Patient not taking: Reported on 10/02/2014 11/02/13   Rodolph Bong, MD   BP 172/98 mmHg  Pulse 82  Temp(Src) 98.6 F (37 C) (Oral)  Resp 18  SpO2 96% Physical Exam  Constitutional: She is oriented to person, place, and time. She appears well-developed and well-nourished. No distress.  HENT:  Head: Normocephalic and atraumatic.  Right Ear: External ear normal.  Left Ear: External ear normal.  Nose: Nose normal.  Mouth/Throat: Oropharynx is clear and moist. No oropharyngeal exudate.  Eyes: Conjunctivae and EOM are normal. Pupils are equal, round, and reactive to light. Right eye exhibits no discharge. Left eye exhibits no discharge. No scleral icterus.  Neck: Normal range of motion. Neck supple.  Cardiovascular: Normal rate, regular rhythm, normal heart sounds and intact distal pulses.  Exam reveals no gallop and no friction rub.   No murmur heard. Pulmonary/Chest: Effort normal. No respiratory distress. She has wheezes. She has no rales.  Slight wheezing bilaterally.   Abdominal: Soft. Bowel sounds are normal. She exhibits no distension. There is no tenderness.  Musculoskeletal: Normal range of motion. She exhibits no edema or tenderness.  Patient is spontaneously moving all extremities in a coordinated fashion exhibiting good strength. Patient has 5/5 strength in her bilateral upper and lower extremities. Bilateral patellar DTRs are intact.  Lymphadenopathy:    She has no cervical adenopathy.  Neurological: She is alert and oriented to person, place, and time. She has normal reflexes. She displays normal reflexes. No cranial nerve deficit. She exhibits normal muscle tone. Coordination normal.  Cranial nerves II through XII intact bilaterally. Finger-to-nose intact bilaterally. Some difficulty with rapid alternating movements in right hand. Some difficulty with spreading her fingers on her right hand against resistance. Finger-to-nose intact blaterally.  No pronator drift.  Patient reports she can feel me touching her right face but states it feels different from her left side. Patient able to ambulate in the room without difficulty or assistance. Strength 5 out of 5 in her bilateral upper and lower extremities. Gait is normal, no foot drop. Bilateral patellar DTRs are intact. Equal grip strengths.   Skin: Skin is warm and dry. No rash noted. She is not diaphoretic. No erythema. No pallor.  Psychiatric: She has a normal mood and affect. Her behavior is normal.  Nursing note and vitals reviewed.   ED Course  Procedures (including critical care time) Labs Review Labs Reviewed  CBC - Abnormal; Notable for the following:    RDW 15.9 (*)    All other components within normal limits  DIFFERENTIAL - Abnormal; Notable for the following:    Eosinophils Relative 6 (*)    All other components within normal limits  COMPREHENSIVE METABOLIC PANEL - Abnormal; Notable for the following:    Potassium 3.0 (*)    Glucose, Bld 101 (*)    Creatinine, Ser 1.75 (*)    GFR calc non Af Amer 34 (*)    GFR calc Af Amer 39 (*)    All other components within normal limits  CBG MONITORING, ED - Abnormal; Notable for the following:    Glucose-Capillary 102 (*)    All other  components within normal limits  PROTIME-INR  APTT  MAGNESIUM  HEMOGLOBIN A1C  LIPID PANEL  CBC  CREATININE, SERUM  I-STAT TROPOININ, ED    Imaging Review Ct Head Wo Contrast  10/02/2014   CLINICAL DATA:  Right-sided weakness since yesterday.  EXAM: CT HEAD WITHOUT CONTRAST  TECHNIQUE: Contiguous axial images were obtained from the base of the skull through the vertex without intravenous contrast.  COMPARISON:  None.  FINDINGS: There is no acute hemorrhage or mass. There is periventricular white matter lucency in both frontal lobes, left greater than right. Brain parenchyma is otherwise normal. Ventricles are not dilated. No osseous abnormality.  IMPRESSION: White matter lucency in the frontal lobes, probably  representing small vessel ischemic disease. However, the lucency in the left frontal lobe could represent a subacute infarct.   Electronically Signed   By: Geanie Cooley M.D.   On: 10/02/2014 20:50     EKG Interpretation   Date/Time:  Monday October 02 2014 17:15:03 EST Ventricular Rate:  80 PR Interval:  184 QRS Duration: 82 QT Interval:  411 QTC Calculation: 474 R Axis:   51 Text Interpretation:  Sinus rhythm Probable left atrial enlargement Low  voltage, precordial leads Borderline repolarization abnormality Baseline  wander in lead(s) I III aVL V2 No old tracing to compare Confirmed by  [facility]  MD, TREY ([id number]) on 10/02/2014 10:09:47 PM      Filed Vitals:   10/02/14 1715 10/02/14 1801 10/02/14 1945 10/02/14 1951  BP: 159/77 157/73 172/98   Pulse: 74 77 82   Temp: 97.8 F (36.6 C)   98.6 F (37 C)  TempSrc: Oral     Resp: 16 18 18    SpO2: 96% 96% 96%      MDM   Meds given in ED:  Medications  potassium chloride 10 mEq in 100 mL IVPB (10 mEq Intravenous New Bag/Given 10/02/14 2155)   stroke: mapping our early stages of recovery book (not administered)  senna-docusate (Senokot-S) tablet 1 tablet (not administered)  enoxaparin (LOVENOX) injection 30 mg (not administered)  albuterol (PROVENTIL) (2.5 MG/3ML) 0.083% nebulizer solution 5 mg (not administered)  albuterol (PROVENTIL HFA;VENTOLIN HFA) 108 (90 BASE) MCG/ACT inhaler 2 puff (not administered)  metoprolol succinate (TOPROL-XL) 24 hr tablet 100 mg (not administered)    New Prescriptions   No medications on file    Final diagnoses:  Right sided weakness   Andrea Clements is a 48 y.o. female with history of asthma and hypertension who presents to the ED complaining of right-sided weakness and right facial numbness since yesterday at 2 PM. Patient does have some difficulty with her right side with rapid alternating movements. She does have some difficulty keeping her fingers extended against resistance on her  right side.  The patient's CMP returned with a potassium of 3.0. Check her magnesium was normal limits. Patient is given 2 runs of potassium while in the ED. The patient's CT scan returned with lucency in her left frontal lobe which could represent a subacute infarct. Order placed for MRI brain. After discussing with Dr. Loretha Stapler he will admit this patient for further workup. Patient is in agreement with admission.  The patient was discussed with and evaluated by Dr. Loretha Stapler who agrees with assessment and plan.      Lawana Chambers, PA-C 10/02/14 2245  Warnell Forester, MD 10/04/14 [phone]

## 2014-10-02 NOTE — ED Notes (Signed)
Awake. Verbally responsive. A/O x4. Resp even and unlabored. Occ nonproductive cough noted. ABC's intact. Speech clear. No facial drooping noted. Bil hand grip equal and strong. No pronation arm drifting noted.  Able to ambulated without difficulty. Gait steady. SR on monitor ar 77bpm.

## 2014-10-02 NOTE — H&P (Signed)
Hospitalist Admission History and Physical  Patient name: Andrea Clements Medical record number: [id number] Date of birth: 1966-08-22 Age: 48 y.o. Gender: female  Primary Care Provider: Default, Provider, MD  Chief Complaint: CVA, AKI  History of Present Illness:This is a 48 y.o. year old female with significant past medical history of HTN, obesity, tobacco abuse, asthma  presenting with CVA. Pt reports R hand and R foot weakness over past 2-3 days. Denies any confusion, slurred speech. No CP, SOB. No nausea, vomiting. Sxs have persisted. Pt does not take ASA.  Presented to ER afebrile, hemodynacmically stable. CBC and CMET WNL apart from K 3.0. Cr 1.75. Head CT White matter lucency in the frontal lobes, probably representing small vessel ischemic disease. However, the lucency in the left frontal lobe could represent a subacute infarct. EDP [place] spoke w/ Dr. Roseanne Reno w/ neuro who recommends inpt admission at [facility].   Assessment and Plan: Andrea Clements is a 48 y.o. year old female presenting with CVA, AKI   Active Problems:   CVA (cerebral infarction)   1-CVA  -proceed down stroke pathway including MRI, MRA, carotid dopplers, 2D ECHO, risk stratification labs -start baby ASA -tx to [facility]  -f/u neuro recs   2- HTN -stable  -cont home regimen   3- AKI  -? Subacute vs. Chronic issue (Cr 12/2012 1.9) -gently hydrate  -follow   4- asthma -+ wheezes on exam w/o resp distress -cont albuterol    FEN/GI: heart healthy diet  Prophylaxis: lovenox  Disposition: pending further evaluation  Code Status:Full Code    Patient Active Problem List   Diagnosis Date Noted  . CVA (cerebral infarction) 10/02/2014   Past Medical History: Past Medical History  Diagnosis Date  . Hypertension   . Asthma     Past Surgical History: Past Surgical History  Procedure Laterality Date  . Abdominal hysterectomy    . Cesarean section      Social History: History   Social History  .  Marital Status: Single    Spouse Name: N/A    Number of Children: N/A  . Years of Education: N/A   Social History Main Topics  . Smoking status: Current Every Day Smoker -- 0.50 packs/day    Types: Cigarettes  . Smokeless tobacco: None  . Alcohol Use: No     Comment: occ  . Drug Use: No  . Sexual Activity: None   Other Topics Concern  . None   Social History Narrative    Family History: History reviewed. No pertinent family history.  Allergies: Allergies  Allergen Reactions  . Altace [Ramipril] Anaphylaxis  . Aspirin Anaphylaxis    Current Facility-Administered Medications  Medication Dose Route Frequency Provider Last Rate Last Dose  .  stroke: mapping our early stages of recovery book   Does not apply Once Doree Albee, MD      . albuterol (PROVENTIL HFA;VENTOLIN HFA) 108 (90 BASE) MCG/ACT inhaler 2 puff  2 puff Inhalation Q6H PRN Doree Albee, MD      . albuterol (PROVENTIL) (2.5 MG/3ML) 0.083% nebulizer solution 5 mg  5 mg Nebulization Q6H PRN Doree Albee, MD      . enoxaparin (LOVENOX) injection 30 mg  30 mg Subcutaneous Q24H Doree Albee, MD      . metoprolol succinate (TOPROL-XL) 24 hr tablet 100 mg  100 mg Oral BID Doree Albee, MD      . potassium chloride 10 mEq in 100 mL IVPB  10 mEq Intravenous Q1 Hr x 2  Jamesetta OrleansChristopher W Lawyer, PA-C 100 mL/hr at 10/02/14 2155 10 mEq at 10/02/14 2155  . senna-docusate (Senokot-S) tablet 1 tablet  1 tablet Oral QHS PRN Doree AlbeeSteven Newton, MD       Current Outpatient Prescriptions  Medication Sig Dispense Refill  . albuterol (PROVENTIL HFA;VENTOLIN HFA) 108 (90 BASE) MCG/ACT inhaler Inhale 2 puffs into the lungs every 6 (six) hours as needed for wheezing or shortness of breath. 1 Inhaler 1  . amLODipine (NORVASC) 5 MG tablet Take 5 mg by mouth daily.    . indapamide (LOZOL) 1.25 MG tablet Take 1.25 mg by mouth daily.    . metoprolol succinate (TOPROL-XL) 100 MG 24 hr tablet Take 100 mg by mouth 2 (two) times daily. Take with or  immediately following a meal.    . albuterol (PROVENTIL) (2.5 MG/3ML) 0.083% nebulizer solution Take 6 mLs (5 mg total) by nebulization every 6 (six) hours as needed for wheezing. 75 mL 12  . HYDROcodone-acetaminophen (NORCO/VICODIN) 5-325 MG per tablet Take 0.5 tablets by mouth at bedtime as needed (cough). (Patient not taking: Reported on 10/02/2014) 6 tablet 0  . predniSONE (DELTASONE) 50 MG tablet Take 1 tablet (50 mg total) by mouth daily. (Patient not taking: Reported on 10/02/2014) 5 tablet 0   Review Of Systems: 12 point ROS negative except as noted above in HPI.  Physical Exam: Filed Vitals:   10/02/14 1951  BP:   Pulse:   Temp: 98.6 F (37 C)  Resp:     General: alert and cooperative HEENT: PERRLA and extra ocular movement intact Heart: S1, S2 normal, no murmur, rub or gallop, regular rate and rhythm Lungs: clear to auscultation, no wheezes or rales and unlabored breathing Abdomen: obese abdomen, non tender, + bowel sounds  Extremities: extremities normal, atraumatic, no cyanosis or edema Skin:no rashes Neurology: mild RUE weakness on exam, otherwise grossly normal exam   Labs and Imaging: Lab Results  Component Value Date/Time   NA 139 10/02/2014 05:19 PM   K 3.0* 10/02/2014 05:19 PM   CL 98 10/02/2014 05:19 PM   CO2 27 10/02/2014 05:19 PM   BUN 19 10/02/2014 05:19 PM   CREATININE 1.75* 10/02/2014 05:19 PM   GLUCOSE 101* 10/02/2014 05:19 PM   Lab Results  Component Value Date   WBC 5.2 10/02/2014   HGB 14.3 10/02/2014   HCT 43.1 10/02/2014   MCV 88.1 10/02/2014   PLT 225 10/02/2014    Ct Head Wo Contrast  10/02/2014   CLINICAL DATA:  Right-sided weakness since yesterday.  EXAM: CT HEAD WITHOUT CONTRAST  TECHNIQUE: Contiguous axial images were obtained from the base of the skull through the vertex without intravenous contrast.  COMPARISON:  None.  FINDINGS: There is no acute hemorrhage or mass. There is periventricular white matter lucency in both frontal  lobes, left greater than right. Brain parenchyma is otherwise normal. Ventricles are not dilated. No osseous abnormality.  IMPRESSION: White matter lucency in the frontal lobes, probably representing small vessel ischemic disease. However, the lucency in the left frontal lobe could represent a subacute infarct.   Electronically Signed   By: Geanie CooleyJim  Maxwell M.D.   On: 10/02/2014 20:50           Doree AlbeeSteven Newton MD  Pager: [phone]

## 2014-10-03 ENCOUNTER — Encounter (HOSPITAL_COMMUNITY): Payer: Self-pay | Admitting: *Deleted

## 2014-10-03 DIAGNOSIS — M6289 Other specified disorders of muscle: Secondary | ICD-10-CM

## 2014-10-03 DIAGNOSIS — I429 Cardiomyopathy, unspecified: Secondary | ICD-10-CM | POA: Diagnosis present

## 2014-10-03 DIAGNOSIS — E876 Hypokalemia: Secondary | ICD-10-CM | POA: Diagnosis present

## 2014-10-03 DIAGNOSIS — I6330 Cerebral infarction due to thrombosis of unspecified cerebral artery: Secondary | ICD-10-CM | POA: Diagnosis not present

## 2014-10-03 DIAGNOSIS — I639 Cerebral infarction, unspecified: Secondary | ICD-10-CM | POA: Diagnosis not present

## 2014-10-03 DIAGNOSIS — Z7902 Long term (current) use of antithrombotics/antiplatelets: Secondary | ICD-10-CM | POA: Diagnosis not present

## 2014-10-03 DIAGNOSIS — I517 Cardiomegaly: Secondary | ICD-10-CM

## 2014-10-03 DIAGNOSIS — N179 Acute kidney failure, unspecified: Secondary | ICD-10-CM | POA: Diagnosis present

## 2014-10-03 DIAGNOSIS — E669 Obesity, unspecified: Secondary | ICD-10-CM

## 2014-10-03 DIAGNOSIS — I10 Essential (primary) hypertension: Secondary | ICD-10-CM | POA: Diagnosis present

## 2014-10-03 DIAGNOSIS — I5040 Unspecified combined systolic (congestive) and diastolic (congestive) heart failure: Secondary | ICD-10-CM | POA: Diagnosis present

## 2014-10-03 DIAGNOSIS — G8191 Hemiplegia, unspecified affecting right dominant side: Secondary | ICD-10-CM | POA: Diagnosis present

## 2014-10-03 DIAGNOSIS — Z6841 Body Mass Index (BMI) 40.0 and over, adult: Secondary | ICD-10-CM | POA: Diagnosis not present

## 2014-10-03 DIAGNOSIS — F17210 Nicotine dependence, cigarettes, uncomplicated: Secondary | ICD-10-CM | POA: Diagnosis present

## 2014-10-03 DIAGNOSIS — R531 Weakness: Secondary | ICD-10-CM | POA: Insufficient documentation

## 2014-10-03 DIAGNOSIS — J45909 Unspecified asthma, uncomplicated: Secondary | ICD-10-CM | POA: Diagnosis present

## 2014-10-03 DIAGNOSIS — Z886 Allergy status to analgesic agent status: Secondary | ICD-10-CM | POA: Diagnosis not present

## 2014-10-03 DIAGNOSIS — I739 Peripheral vascular disease, unspecified: Secondary | ICD-10-CM | POA: Diagnosis present

## 2014-10-03 LAB — LIPID PANEL
Cholesterol: 198 mg/dL (ref 0–200)
HDL: 35 mg/dL — ABNORMAL LOW (ref >39)
LDL Cholesterol: 127 mg/dL — ABNORMAL HIGH (ref 0–99)
Total CHOL/HDL Ratio: 5.7 RATIO
Triglycerides: 180 mg/dL — ABNORMAL HIGH (ref <150)
VLDL: 36 mg/dL (ref 0–40)

## 2014-10-03 LAB — HEMOGLOBIN A1C
HEMOGLOBIN A1C: 5.9 % — AB (ref <5.7)
Mean Plasma Glucose: 123 mg/dL — ABNORMAL HIGH (ref <117)

## 2014-10-03 LAB — BASIC METABOLIC PANEL
Anion gap: 14 (ref 5–15)
BUN: 16 mg/dL (ref 6–23)
CO2: 25 mEq/L (ref 19–32)
CREATININE: 1.51 mg/dL — AB (ref 0.50–1.10)
Calcium: 8.7 mg/dL (ref 8.4–10.5)
Chloride: 101 mEq/L (ref 96–112)
GFR calc Af Amer: 47 mL/min — ABNORMAL LOW (ref >90)
GFR, EST NON AFRICAN AMERICAN: 41 mL/min — AB (ref >90)
Glucose, Bld: 105 mg/dL — ABNORMAL HIGH (ref 70–99)
Potassium: 3.4 mEq/L — ABNORMAL LOW (ref 3.7–5.3)
Sodium: 140 mEq/L (ref 137–147)

## 2014-10-03 MED ORDER — SIMVASTATIN 20 MG PO TABS
20.0000 mg | ORAL_TABLET | Freq: Every day | ORAL | Status: DC
  Administered 2014-10-03: 20 mg via ORAL
  Filled 2014-10-03: qty 1

## 2014-10-03 MED ORDER — GUAIFENESIN-DM 100-10 MG/5ML PO SYRP
5.0000 mL | ORAL_SOLUTION | ORAL | Status: DC | PRN
  Administered 2014-10-03: 5 mL via ORAL
  Filled 2014-10-03: qty 5

## 2014-10-03 MED ORDER — LORATADINE 10 MG PO TABS
10.0000 mg | ORAL_TABLET | Freq: Every day | ORAL | Status: DC
  Administered 2014-10-03 – 2014-10-04 (×2): 10 mg via ORAL
  Filled 2014-10-03 (×2): qty 1

## 2014-10-03 MED ORDER — ALBUTEROL SULFATE (2.5 MG/3ML) 0.083% IN NEBU
5.0000 mg | INHALATION_SOLUTION | Freq: Three times a day (TID) | RESPIRATORY_TRACT | Status: DC
  Filled 2014-10-03: qty 6

## 2014-10-03 MED ORDER — ALBUTEROL SULFATE (2.5 MG/3ML) 0.083% IN NEBU: 5.0000 mg | INHALATION_SOLUTION | Freq: Four times a day (QID) | RESPIRATORY_TRACT | Status: DC | PRN

## 2014-10-03 MED ORDER — SODIUM CHLORIDE 0.9 % IV SOLN
INTRAVENOUS | Status: DC
  Administered 2014-10-03: 16:00:00 via INTRAVENOUS

## 2014-10-03 MED ORDER — IPRATROPIUM-ALBUTEROL 0.5-2.5 (3) MG/3ML IN SOLN: 3.0000 mL | Freq: Four times a day (QID) | RESPIRATORY_TRACT | Status: DC | PRN

## 2014-10-03 MED ORDER — CLOPIDOGREL BISULFATE 75 MG PO TABS
75.0000 mg | ORAL_TABLET | Freq: Every day | ORAL | Status: DC
  Administered 2014-10-03 – 2014-10-04 (×2): 75 mg via ORAL
  Filled 2014-10-03 (×2): qty 1

## 2014-10-03 MED ORDER — FLUTICASONE PROPIONATE 50 MCG/ACT NA SUSP
1.0000 | Freq: Every day | NASAL | Status: DC
  Administered 2014-10-03 – 2014-10-04 (×2): 1 via NASAL
  Filled 2014-10-03: qty 16

## 2014-10-03 NOTE — ED Notes (Addendum)
[facility] arrived to transport pt to [facility]. Personal belongings sent home with family.

## 2014-10-03 NOTE — Progress Notes (Signed)
Echo Lab  2D Echocardiogram completed.  Karey Suthers L Doylene Splinter, RDCS 10/03/2014 12:21 PM

## 2014-10-03 NOTE — Progress Notes (Signed)
PT Cancellation Note  Patient Details Name: Andrea Clements MRN: [id number] DOB: 06-20-1966   Cancelled Treatment:    Reason Eval/Treat Not Completed: Patient at procedure or test/unavailable. Pt with SLP   Andrea Clements 10/03/2014, 10:40 AM Pager [phone]

## 2014-10-03 NOTE — ED Notes (Addendum)
Awake. Verbally responsive. Resp even and unlabored. ABC's intact. NAD noted. IV infusing NS at 12ml/hr without difficulty.

## 2014-10-03 NOTE — Plan of Care (Signed)
Problem: Acute Treatment Outcomes Goal: Prognosis discussed with family/patient as appropriate Outcome: Completed/Met Date Met:  10/03/14

## 2014-10-03 NOTE — Progress Notes (Signed)
UR completed 

## 2014-10-03 NOTE — Plan of Care (Signed)
Problem: Progression Outcomes Goal: Communication method established Outcome: Completed/Met Date Met:  10/03/14

## 2014-10-03 NOTE — Progress Notes (Signed)
Patient ID: Andrea Clements  female  BTD:[id number]    DOB: 1966-10-11    DOA: 10/02/2014  PCP: Geraldo Pitter, MD  Brief history of present illness Patient is a 48 year old female with hypertension, obesity, tobacco abuse, asthma presented with right hand and right foot weakness over the past 2-3 days prior to admission. Otherwise denied any confusion, slurred speech. She does not take aspirin, presented to ER as symptoms persisted. Patient was also noted to be hypokalemic with potassium of 3.0, creatinine 1.75. CT head showed white matter lucency in the frontal lobes, probably small vessel ischemic disease, however the lucency in the left frontal lobe could represent a subacute infarct, patient was admitted for stroke workup.  Assessment/Plan: Principal Problem:   CVA (cerebral infarction) presenting with right hand and foot weakness - MRI of the brain showed acute subcentimeter left basal ganglia/internal capsule lacunar infarct, severe white matter changes, may reflect chronic small vessel ischemic disease though superimposed demyelination may have this appearance, recommend 3-6 month follow-up MRI - MRA showed no acute vascular process normal MRA - Follow 2-D echo, carotid Dopplers - Lipid panel showed cholesterol 198, triglycerides 180, LDL 127, placed on statins - Allergic to aspirin (per patient- tongue swelling), placed on Plavix - Follow hemoglobin A1c, PTOT   Active Problems:   AKI (acute kidney injury) -Creatinine function improving, 1.5, continue gentle hydration     Hypokalemia - Replaced    Essential hypertension -Continue Toprol-XL, will restart Norvasc, also on indapamide     Obesity - Patient counseled on diet and weight control  Asthma : Mild wheezing  - Placed on scheduled nebs TID, Claritin, Robitussin, Flonase   DVT Prophylaxis: Lovenox   Code Status:Full code   Family Communication:  Disposition:  Consultants:  Neurology    Procedures:  MRI/MRA of the brain   Antibiotics:  None    Subjective: Patient seen and examined, states his right hand still feels weak, no fevers or chills chest pain or shortness of breath, no numbness or tingling   Objective: Weight change:  No intake or output data in the 24 hours ending 10/03/14 1016 Blood pressure 152/93, pulse 113, temperature 97.9 F (36.6 C), temperature source Oral, resp. rate 18, height 5\' 1"  (1.549 m), weight 119.75 kg (264 lb), SpO2 96 %.  Physical Exam: General: Alert and awake, oriented x3, not in any acute distress. CVS: S1-S2 clear, no murmur rubs or gallops Chest: clear to auscultation bilaterally, no wheezing, rales or rhonchi Abdomen: soft nontender, nondistended, normal bowel sounds  Extremities: no cyanosis, clubbing or edema noted bilaterally NeuroNo dysarthria, cranial nerves II- XII intact,Slight weakness of the right hand grip   Lab Results: Basic Metabolic Panel:  Recent Labs Lab 10/02/14 1719 10/02/14 1933 10/03/14 0730  NA 139  --  140  K 3.0*  --  3.4*  CL 98  --  101  CO2 27  --  25  GLUCOSE 101*  --  105*  BUN 19  --  16  CREATININE 1.75*  --  1.51*  CALCIUM 9.3  --  8.7  MG  --  2.1  --    Liver Function Tests:  Recent Labs Lab 10/02/14 1719  AST 18  ALT 19  ALKPHOS 81  BILITOT 0.3  PROT 8.2  ALBUMIN 3.8   No results for input(s): LIPASE, AMYLASE in the last 168 hours. No results for input(s): AMMONIA in the last 168 hours. CBC:  Recent Labs Lab 10/02/14 1719  WBC 5.2  NEUTROABS 2.7  HGB 14.3  HCT 43.1  MCV 88.1  PLT 225   Cardiac Enzymes: No results for input(s): CKTOTAL, CKMB, CKMBINDEX, TROPONINI in the last 168 hours. BNP: Invalid input(s): POCBNP CBG:  Recent Labs Lab 10/02/14 1717  GLUCAP 102*     Micro Results: No results found for this or any previous visit (from the past 240 hour(s)).  Studies/Results: Dg Chest 2 View  10/03/2014   CLINICAL DATA:  CVA.  Weakness.   EXAM: CHEST  2 VIEW  COMPARISON:  01/01/2013  FINDINGS: Cardiopericardial enlargement which is similar to 2014. Negative aortic contours. Vascular prominence of the hila, also similar to previous. No pulmonary edema, effusion, or pneumothorax.  IMPRESSION: Chronic cardiopericardial enlargement.   Electronically Signed   By: Tiburcio Pea M.D.   On: 10/03/2014 00:56   Ct Head Wo Contrast  10/02/2014   CLINICAL DATA:  Right-sided weakness since yesterday.  EXAM: CT HEAD WITHOUT CONTRAST  TECHNIQUE: Contiguous axial images were obtained from the base of the skull through the vertex without intravenous contrast.  COMPARISON:  None.  FINDINGS: There is no acute hemorrhage or mass. There is periventricular white matter lucency in both frontal lobes, left greater than right. Brain parenchyma is otherwise normal. Ventricles are not dilated. No osseous abnormality.  IMPRESSION: White matter lucency in the frontal lobes, probably representing small vessel ischemic disease. However, the lucency in the left frontal lobe could represent a subacute infarct.   Electronically Signed   By: Geanie Cooley M.D.   On: 10/02/2014 20:50   Mr Brain Wo Contrast  10/03/2014   ADDENDUM REPORT: 10/03/2014 01:01  ADDENDUM: LEFT basal ganglia micro hemorrhages can be seen with chronic hypertension.   Electronically Signed   By: Awilda Metro   On: 10/03/2014 01:01   10/03/2014   CLINICAL DATA:  RIGHT-sided weakness, assess for stroke. Symptoms began last evening, progressively worsening.  EXAM: MRI HEAD WITHOUT CONTRAST  MRA HEAD WITHOUT CONTRAST  TECHNIQUE: Multiplanar, multiecho pulse sequences of the brain and surrounding structures were obtained without intravenous contrast. Angiographic images of the head were obtained using MRA technique without contrast.  COMPARISON:  CT of the head October 02, 2014 at 2010 hr.  FINDINGS: MRI HEAD FINDINGS  Mild motion degraded examination.  Acute sub cm posterior limb of the internal  capsule/basal ganglia lacunar infarct with decreased ADC values. Two punctate foci of susceptibility artifact within the LEFT putaminal. No midline shift or mass effect. The ventricles and sulci are normal for patient's age. Severe confluent supratentorial and to lesser extent mesial RIGHT cerebellar T2 hyperintensities, greatly advanced for age.  No abnormal extra-axial fluid collections. Mild paranasal sinus mucosal thickening without air-fluid levels. The mastoid air cells appear well-aerated. Ocular globes and orbital contents are nonsuspicious not tailored for evaluation. Mildly expanded fluid signal within the sella. No cerebellar tonsillar ectopia. No suspicious bone marrow signal.  MRA HEAD FINDINGS  Mild motion degraded examination.  Anterior circulation: Normal flow related enhancement of the included cervical, petrous, cavernous and supra clinoid internal carotid arteries. Patent anterior communicating artery. Normal flow related enhancement of the anterior and middle cerebral arteries, including more distal segments.  Posterior circulation: RIGHT vertebral artery is dominant. Basilar artery is patent, with normal flow related enhancement of the main branch vessels. Normal flow related enhancement of the posterior cerebral arteries.  No large vessel occlusion, hemodynamically significant stenosis, abnormal luminal irregularity, aneurysm within the anterior nor posterior circulation.  IMPRESSION: Mild motion degraded examination.  MRI HEAD:  Acute sub cm LEFT basal ganglia/internal capsule lacunar infarct.  Severe white matter changes may reflect chronic small vessel ischemic disease though, superimposed demyelination may have this appearance. Recommend 3 to six-month follow-up MRI of the brain. Contrast could be considered clinically indicated.  Empty sella.  MRA HEAD:  No acute vascular process ; normal MRA of the head.  Electronically Signed: By: Awilda Metroourtnay  Bloomer On: 10/03/2014 00:30   Mr Maxine GlennMra  Head/brain Wo Cm  10/03/2014   ADDENDUM REPORT: 10/03/2014 01:01  ADDENDUM: LEFT basal ganglia micro hemorrhages can be seen with chronic hypertension.   Electronically Signed   By: Awilda Metroourtnay  Bloomer   On: 10/03/2014 01:01   10/03/2014   CLINICAL DATA:  RIGHT-sided weakness, assess for stroke. Symptoms began last evening, progressively worsening.  EXAM: MRI HEAD WITHOUT CONTRAST  MRA HEAD WITHOUT CONTRAST  TECHNIQUE: Multiplanar, multiecho pulse sequences of the brain and surrounding structures were obtained without intravenous contrast. Angiographic images of the head were obtained using MRA technique without contrast.  COMPARISON:  CT of the head October 02, 2014 at 2010 hr.  FINDINGS: MRI HEAD FINDINGS  Mild motion degraded examination.  Acute sub cm posterior limb of the internal capsule/basal ganglia lacunar infarct with decreased ADC values. Two punctate foci of susceptibility artifact within the LEFT putaminal. No midline shift or mass effect. The ventricles and sulci are normal for patient's age. Severe confluent supratentorial and to lesser extent mesial RIGHT cerebellar T2 hyperintensities, greatly advanced for age.  No abnormal extra-axial fluid collections. Mild paranasal sinus mucosal thickening without air-fluid levels. The mastoid air cells appear well-aerated. Ocular globes and orbital contents are nonsuspicious not tailored for evaluation. Mildly expanded fluid signal within the sella. No cerebellar tonsillar ectopia. No suspicious bone marrow signal.  MRA HEAD FINDINGS  Mild motion degraded examination.  Anterior circulation: Normal flow related enhancement of the included cervical, petrous, cavernous and supra clinoid internal carotid arteries. Patent anterior communicating artery. Normal flow related enhancement of the anterior and middle cerebral arteries, including more distal segments.  Posterior circulation: RIGHT vertebral artery is dominant. Basilar artery is patent, with normal flow  related enhancement of the main branch vessels. Normal flow related enhancement of the posterior cerebral arteries.  No large vessel occlusion, hemodynamically significant stenosis, abnormal luminal irregularity, aneurysm within the anterior nor posterior circulation.  IMPRESSION: Mild motion degraded examination.  MRI HEAD: Acute sub cm LEFT basal ganglia/internal capsule lacunar infarct.  Severe white matter changes may reflect chronic small vessel ischemic disease though, superimposed demyelination may have this appearance. Recommend 3 to six-month follow-up MRI of the brain. Contrast could be considered clinically indicated.  Empty sella.  MRA HEAD:  No acute vascular process ; normal MRA of the head.  Electronically Signed: By: Awilda Metroourtnay  Bloomer On: 10/03/2014 00:30    Medications: Scheduled Meds: .  stroke: mapping our early stages of recovery book   Does not apply Once  . albuterol  5 mg Nebulization TID  . clopidogrel  75 mg Oral Q breakfast  . enoxaparin (LOVENOX) injection  30 mg Subcutaneous QHS  . fluticasone  1 spray Each Nare Daily  . loratadine  10 mg Oral Daily  . metoprolol succinate  100 mg Oral BID      LOS: 1 day   Janel Beane M.D. [facility] 10/03/2014, 10:16 AM Pager: [phone]  If 7PM-7AM, please contact night-coverage [email or web address] Password [email or web address]

## 2014-10-03 NOTE — ED Notes (Signed)
Chad with [facility] called and given report.

## 2014-10-03 NOTE — ED Notes (Signed)
Pt returned from MRI without distress noted. 

## 2014-10-03 NOTE — Evaluation (Signed)
Speech Language Pathology Evaluation Patient Details Name: Andrea Clements MRN: [id number] DOB: [birth year] Today's Date: 10/03/2014 Time: 9147-8295 SLP Time Calculation (min) (ACUTE ONLY): 32 min  Problem List:  Patient Active Problem List   Diagnosis Date Noted  . Hypokalemia 10/03/2014  . Essential hypertension 10/03/2014  . Obesity 10/03/2014  . CVA (cerebral infarction) 10/02/2014  . AKI (acute kidney injury) 10/02/2014   Past Medical History:  Past Medical History  Diagnosis Date  . Hypertension   . Asthma    Past Surgical History:  Past Surgical History  Procedure Laterality Date  . Abdominal hysterectomy    . Cesarean section     HPI:  48 yo female adm to [facility] with right sided weakness (hand and leg).  Pt found to have left basal ganglia CVA - hemorrhagic.  SLE ordered.    Assessment / Plan / Recommendation Clinical Impression  Pt with intact cognitive linguistic skills - no dysarthria, aphasia or cognitive deficits apparent.  She demonstrated high level problem solving by indicating need to call to cancel an appt she had scheduled today. 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  Writing and reading abilities intact as well as inference abilities intact.     No SLP indicated as pt at baseine level of function.  Thanks for this referral.       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)   Cognition  Overall Cognitive Status: Within Functional Limits for tasks assessed Arousal/Alertness: Awake/alert Orientation Level: Oriented X4 Attention: Sustained;Selective Sustained Attention: Appears intact Selective Attention: Appears intact Memory: Appears intact (recalled all info MD shared with her and home medications) Awareness: Appears intact Problem Solving: Appears intact (recalled need to call to cancel appt she had scheduled today ) Safety/Judgment: Appears intact    Comprehension  Auditory Comprehension Overall Auditory Comprehension: Appears within functional limits for tasks assessed Yes/No Questions: Not tested Commands: Within Functional Limits Conversation: Complex Visual Recognition/Discrimination Discrimination: Not tested Reading Comprehension Reading Status: Within funtional limits (multi paragraph level)    Expression Expression Primary Mode of Expression: Verbal Verbal Expression Overall Verbal Expression: Appears within functional limits for tasks assessed Initiation: No impairment Repetition: No impairment Naming: Not tested Pragmatics: No impairment Written Expression Dominant Hand: Right Written Expression: Within Functional Limits   Oral / Motor Oral Motor/Sensory Function Overall Oral Motor/Sensory Function: Appears within functional limits for tasks assessed (except lower right facial sensation decreased, trigeminal nerve) Motor Speech Overall Motor Speech: Appears within functional limits for tasks assessed Respiration: Within functional limits Resonance: Within functional limits Articulation: Within functional limitis Intelligibility: Intelligible Motor Planning: Witnin functional limits Motor Speech Errors: Not applicable   GO Functional Assessment Tool Used: clinical judgement Functional Limitations: Motor speech Motor Speech Current Status [phone]): 0 percent impaired, limited or restricted Motor Speech Goal Status  ([id number]): 0 percent impaired, limited or restricted Motor Speech Goal Status (I6962): 0 percent impaired, limited or restricted   Donavan Burnet,  [facility] SLP [phone]

## 2014-10-03 NOTE — Progress Notes (Signed)
STROKE TEAM PROGRESS NOTE   HISTORY Andrea Clements is an 48 y.o. female with a history of hypertension and obesity who presented to the emergency weakness right hand and face. Onset was at 1:00 PM on 10/01/2014. There's no previous history of stroke or TIA. She has not therapy. MRI of the brain showed a small subcentimeter left basal ganglia/capsular ischemic infarction. MRA of the head. NIH stroke score was 2. Patient was not administered TPA secondary to Beyond time window for treatment consideration. She was admitted for further evaluation and treatment.   SUBJECTIVE (INTERVAL HISTORY) No family is at the bedside.  Overall she feels her condition is stable.    OBJECTIVE Temp:  [97.8 F (36.6 C)-98.6 F (37 C)] 97.9 F (36.6 C) (11/24 0751) Pulse Rate:  [67-113] 113 (11/24 0751) Cardiac Rhythm:  [-] Normal sinus rhythm (11/24 0600) Resp:  [16-18] 18 (11/24 0600) BP: (143-173)/(63-98) 152/93 mmHg (11/24 0751) SpO2:  [96 %-100 %] 96 % (11/24 0751) Weight:  [119.75 kg (264 lb)] 119.75 kg (264 lb) (11/24 0210)   Recent Labs Lab 10/02/14 1717  GLUCAP 102*    Recent Labs Lab 10/02/14 1719 10/02/14 1933 10/03/14 0730  NA 139  --  140  K 3.0*  --  3.4*  CL 98  --  101  CO2 27  --  25  GLUCOSE 101*  --  105*  BUN 19  --  16  CREATININE 1.75*  --  1.51*  CALCIUM 9.3  --  8.7  MG  --  2.1  --     Recent Labs Lab 10/02/14 1719  AST 18  ALT 19  ALKPHOS 81  BILITOT 0.3  PROT 8.2  ALBUMIN 3.8    Recent Labs Lab 10/02/14 1719  WBC 5.2  NEUTROABS 2.7  HGB 14.3  HCT 43.1  MCV 88.1  PLT 225   No results for input(s): CKTOTAL, CKMB, CKMBINDEX, TROPONINI in the last 168 hours.  Recent Labs  10/02/14 1719  LABPROT 13.1  INR 0.98   No results for input(s): COLORURINE, LABSPEC, PHURINE, GLUCOSEU, HGBUR, BILIRUBINUR, KETONESUR, PROTEINUR, UROBILINOGEN, NITRITE, LEUKOCYTESUR in the last 72 hours.  Invalid input(s): APPERANCEUR     Component Value Date/Time    CHOL 198 10/03/2014 0730   TRIG 180* 10/03/2014 0730   HDL 35* 10/03/2014 0730   CHOLHDL 5.7 10/03/2014 0730   VLDL 36 10/03/2014 0730   LDLCALC 127* 10/03/2014 0730   No results found for: HGBA1C No results found for: LABOPIA, COCAINSCRNUR, LABBENZ, AMPHETMU, THCU, LABBARB  No results for input(s): ETH in the last 168 hours.  Dg Chest 2 View  10/03/2014   CLINICAL DATA:  CVA.  Weakness.  EXAM: CHEST  2 VIEW  COMPARISON:  01/01/2013  FINDINGS: Cardiopericardial enlargement which is similar to 2014. Negative aortic contours. Vascular prominence of the hila, also similar to previous. No pulmonary edema, effusion, or pneumothorax.  IMPRESSION: Chronic cardiopericardial enlargement.   Electronically Signed   By: Tiburcio Pea M.D.   On: 10/03/2014 00:56   Ct Head Wo Contrast  10/02/2014   CLINICAL DATA:  Right-sided weakness since yesterday.  EXAM: CT HEAD WITHOUT CONTRAST  TECHNIQUE: Contiguous axial images were obtained from the base of the skull through the vertex without intravenous contrast.  COMPARISON:  None.  FINDINGS: There is no acute hemorrhage or mass. There is periventricular white matter lucency in both frontal lobes, left greater than right. Brain parenchyma is otherwise normal. Ventricles are not dilated. No osseous abnormality.  IMPRESSION: White matter lucency in the frontal lobes, probably representing small vessel ischemic disease. However, the lucency in the left frontal lobe could represent a subacute infarct.   Electronically Signed   By: Geanie CooleyJim  Maxwell M.D.   On: 10/02/2014 20:50   Mr Brain Wo Contrast  10/03/2014   ADDENDUM REPORT: 10/03/2014 01:01  ADDENDUM: LEFT basal ganglia micro hemorrhages can be seen with chronic hypertension.   Electronically Signed   By: Awilda Metroourtnay  Bloomer   On: 10/03/2014 01:01   10/03/2014   CLINICAL DATA:  RIGHT-sided weakness, assess for stroke. Symptoms began last evening, progressively worsening.  EXAM: MRI HEAD WITHOUT CONTRAST  MRA HEAD  WITHOUT CONTRAST  TECHNIQUE: Multiplanar, multiecho pulse sequences of the brain and surrounding structures were obtained without intravenous contrast. Angiographic images of the head were obtained using MRA technique without contrast.  COMPARISON:  CT of the head October 02, 2014 at 2010 hr.  FINDINGS: MRI HEAD FINDINGS  Mild motion degraded examination.  Acute sub cm posterior limb of the internal capsule/basal ganglia lacunar infarct with decreased ADC values. Two punctate foci of susceptibility artifact within the LEFT putaminal. No midline shift or mass effect. The ventricles and sulci are normal for patient's age. Severe confluent supratentorial and to lesser extent mesial RIGHT cerebellar T2 hyperintensities, greatly advanced for age.  No abnormal extra-axial fluid collections. Mild paranasal sinus mucosal thickening without air-fluid levels. The mastoid air cells appear well-aerated. Ocular globes and orbital contents are nonsuspicious not tailored for evaluation. Mildly expanded fluid signal within the sella. No cerebellar tonsillar ectopia. No suspicious bone marrow signal.  MRA HEAD FINDINGS  Mild motion degraded examination.  Anterior circulation: Normal flow related enhancement of the included cervical, petrous, cavernous and supra clinoid internal carotid arteries. Patent anterior communicating artery. Normal flow related enhancement of the anterior and middle cerebral arteries, including more distal segments.  Posterior circulation: RIGHT vertebral artery is dominant. Basilar artery is patent, with normal flow related enhancement of the main branch vessels. Normal flow related enhancement of the posterior cerebral arteries.  No large vessel occlusion, hemodynamically significant stenosis, abnormal luminal irregularity, aneurysm within the anterior nor posterior circulation.  IMPRESSION: Mild motion degraded examination.  MRI HEAD: Acute sub cm LEFT basal ganglia/internal capsule lacunar infarct.   Severe white matter changes may reflect chronic small vessel ischemic disease though, superimposed demyelination may have this appearance. Recommend 3 to six-month follow-up MRI of the brain. Contrast could be considered clinically indicated.  Empty sella.  MRA HEAD:  No acute vascular process ; normal MRA of the head.  Electronically Signed: By: Awilda Metroourtnay  Bloomer On: 10/03/2014 00:30   Mr Maxine GlennMra Head/brain Wo Cm  10/03/2014   ADDENDUM REPORT: 10/03/2014 01:01  ADDENDUM: LEFT basal ganglia micro hemorrhages can be seen with chronic hypertension.   Electronically Signed   By: Awilda Metroourtnay  Bloomer   On: 10/03/2014 01:01   10/03/2014   CLINICAL DATA:  RIGHT-sided weakness, assess for stroke. Symptoms began last evening, progressively worsening.  EXAM: MRI HEAD WITHOUT CONTRAST  MRA HEAD WITHOUT CONTRAST  TECHNIQUE: Multiplanar, multiecho pulse sequences of the brain and surrounding structures were obtained without intravenous contrast. Angiographic images of the head were obtained using MRA technique without contrast.  COMPARISON:  CT of the head October 02, 2014 at 2010 hr.  FINDINGS: MRI HEAD FINDINGS  Mild motion degraded examination.  Acute sub cm posterior limb of the internal capsule/basal ganglia lacunar infarct with decreased ADC values. Two punctate foci of susceptibility artifact within the LEFT  putaminal. No midline shift or mass effect. The ventricles and sulci are normal for patient's age. Severe confluent supratentorial and to lesser extent mesial RIGHT cerebellar T2 hyperintensities, greatly advanced for age.  No abnormal extra-axial fluid collections. Mild paranasal sinus mucosal thickening without air-fluid levels. The mastoid air cells appear well-aerated. Ocular globes and orbital contents are nonsuspicious not tailored for evaluation. Mildly expanded fluid signal within the sella. No cerebellar tonsillar ectopia. No suspicious bone marrow signal.  MRA HEAD FINDINGS  Mild motion degraded  examination.  Anterior circulation: Normal flow related enhancement of the included cervical, petrous, cavernous and supra clinoid internal carotid arteries. Patent anterior communicating artery. Normal flow related enhancement of the anterior and middle cerebral arteries, including more distal segments.  Posterior circulation: RIGHT vertebral artery is dominant. Basilar artery is patent, with normal flow related enhancement of the main branch vessels. Normal flow related enhancement of the posterior cerebral arteries.  No large vessel occlusion, hemodynamically significant stenosis, abnormal luminal irregularity, aneurysm within the anterior nor posterior circulation.  IMPRESSION: Mild motion degraded examination.  MRI HEAD: Acute sub cm LEFT basal ganglia/internal capsule lacunar infarct.  Severe white matter changes may reflect chronic small vessel ischemic disease though, superimposed demyelination may have this appearance. Recommend 3 to six-month follow-up MRI of the brain. Contrast could be considered clinically indicated.  Empty sella.  MRA HEAD:  No acute vascular process ; normal MRA of the head.  Electronically Signed: By: Awilda Metro On: 10/03/2014 00:30     PHYSICAL EXAM Obese young African American lady not in distress.Awake alert. Afebrile. Head is nontraumatic. Neck is supple without bruit. Hearing is normal. Cardiac exam no murmur or gallop. Lungs are clear to auscultation. Distal pulses are well felt. Neurological Exam : Awake alert oriented x 3 normal speech and language. Mild right lower face asymmetry. Tongue midline. No drift. Mild diminished fine finger movements on right. Orbits left over right upper extremity. Mild right grip weak.. Normal sensation . Normal coordination. ASSESSMENT/PLAN Andrea Clements is a 48 y.o. female with history of hypertension and obesity presenting with weakness and numbness of  right hand. She did not receive IV t-PA due to delay in arrival.    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  Resultant  Mild right hemiparesis  MRI  L basal ganglia infarct  MRA  Unremarkable   Carotid Doppler  pending   2D Echo  pending   HgbA1c pending  Lovenox 30 mg sq daily for VTE prophylaxis  Diet Heart thin liquids  no antithrombotics prior to admission, placed on clopidogrel 75 mg orally every day given aspirin allergy  Patient counseled to be compliant with her antithrombotic medications  Ongoing aggressive stroke risk factor management  Therapy recommendations:  No OT  Disposition:  Anticipate d/c home  Hypertension  Home meds:   Norvasc, lozol, toprol  Stable  Patient counseled to be compliant with her blood pressure medications  Hyperlipidemia  Home meds:  No statin  LDL 127, goal < 70  Added statin - zocor 20 mg daily  Continue statin at discharge  Other Stroke Risk Factors  Cigarette smoker, advised to stop smoking  Morbid Obesity, Body mass index is 49.91 kg/(m^2).   Has had obstructive sleep apnea testing, mask not recommended per pt  Other Active Problems  Acute kidney injury  asthma  Hospital day # 1  [facility] BIBY, MSN, APRN, ANVP-BC, AGPCNP-BC [facility] Pager: [phone] 10/03/2014 10:17  AM  I have personally examined this patient, reviewed notes, independently viewed imaging studies, participated in medical decision making and plan of care. I have made any additions or clarifications directly to the above note. Agree with note above. She has small left basal ganglia infarct secondary to small vessel disease. Stroke workup is ongoing   Delia Heady, MD Medical Director [facility] Pager: [phone] 10/03/2014 12:24 PM    To contact Stroke Continuity provider, please refer to [email or web address]. After hours, contact General Neurology

## 2014-10-03 NOTE — Consult Note (Signed)
Referring Physician: Dr. Loretha Stapler    Chief Complaint: Weakness and numbness right hand.  HPI: Andrea Clements is an 48 y.o. female with a history of hypertension and obesity who presented to the emergency weakness right hand and face. Onset was at 1:00 PM on 10/01/2014. There's no previous history of stroke or TIA. She has not therapy. MRI of the brain showed a small subcentimeter left basal ganglia/capsular ischemic infarction. MRA of the head. NIH stroke score was 2.  LSN: 1 PM on 10/01/2014 tPA Given: No: Beyond time window for treatment consideration mRankin:  Past Medical History  Diagnosis Date  . Hypertension   . Asthma     Family history: Positive for hypertension; negative for stroke.  Medications: I have reviewed the patient's current medications.  ROS: History obtained from the patient  General ROS: negative for - chills, fatigue, fever, night sweats, weight gain or weight loss Psychological ROS: negative for - behavioral disorder, hallucinations, memory difficulties, mood swings or suicidal ideation Ophthalmic ROS: negative for - blurry vision, double vision, eye pain or loss of vision ENT ROS: negative for - epistaxis, nasal discharge, oral lesions, sore throat, tinnitus or vertigo Allergy and Immunology ROS: negative for - hives or itchy/watery eyes Hematological and Lymphatic ROS: negative for - bleeding problems, bruising or swollen lymph nodes Endocrine ROS: negative for - galactorrhea, hair pattern changes, polydipsia/polyuria or temperature intolerance Respiratory ROS: negative for - cough, hemoptysis, shortness of breath or wheezing Cardiovascular ROS: negative for - chest pain, dyspnea on exertion, edema or irregular heartbeat Gastrointestinal ROS: negative for - abdominal pain, diarrhea, hematemesis, nausea/vomiting or stool incontinence Genito-Urinary ROS: negative for - dysuria, hematuria, incontinence or urinary frequency/urgency Musculoskeletal ROS:  negative for - joint swelling or muscular weakness Neurological ROS: as noted in HPI Dermatological ROS: negative for rash and skin lesion changes  Physical Examination: Blood pressure 143/63, pulse 77, temperature 97.9 F (36.6 C), temperature source Oral, resp. rate 18, height 5\' 1"  (1.549 m), weight 119.75 kg (264 lb), SpO2 98 %.   HEENT: Normal  Neurologic Examination: Mental Status: Alert, oriented, thought content appropriate.  Speech fluent without evidence of aphasia. Able to follow commands without difficulty. Cranial Nerves: II-Visual fields were normal. III/IV/VI-Pupils were equal and reacted. Extraocular movements were full and conjugate.    V/VII-slight numbness to tactile sensation right face; no facial weakness. VIII-normal. X-normal speech and symmetrical palatal movement. Motor: Mild right upper extremity drift with slight weakness of right hand grip; motor exam otherwise unremarkable. Sensory: Normal throughout. Deep Tendon Reflexes: 2+ and symmetric. Plantars: Flexor on the left and mute on the right Cerebellar: Normal finger-to-nose testing.   Dg Chest 2 View  10/03/2014   CLINICAL DATA:  CVA.  Weakness.  EXAM: CHEST  2 VIEW  COMPARISON:  01/01/2013  FINDINGS: Cardiopericardial enlargement which is similar to 2014. Negative aortic contours. Vascular prominence of the hila, also similar to previous. No pulmonary edema, effusion, or pneumothorax.  IMPRESSION: Chronic cardiopericardial enlargement.   Electronically Signed   By: Tiburcio Pea M.D.   On: 10/03/2014 00:56   Ct Head Wo Contrast  10/02/2014   CLINICAL DATA:  Right-sided weakness since yesterday.  EXAM: CT HEAD WITHOUT CONTRAST  TECHNIQUE: Contiguous axial images were obtained from the base of the skull through the vertex without intravenous contrast.  COMPARISON:  None.  FINDINGS: There is no acute hemorrhage or mass. There is periventricular white matter lucency in both frontal lobes, left greater than  right. Brain parenchyma is otherwise normal.  Ventricles are not dilated. No osseous abnormality.  IMPRESSION: White matter lucency in the frontal lobes, probably representing small vessel ischemic disease. However, the lucency in the left frontal lobe could represent a subacute infarct.   Electronically Signed   By: Geanie Cooley M.D.   On: 10/02/2014 20:50   Mr Brain Wo Contrast  10/03/2014   ADDENDUM REPORT: 10/03/2014 01:01  ADDENDUM: LEFT basal ganglia micro hemorrhages can be seen with chronic hypertension.   Electronically Signed   By: Awilda Metro   On: 10/03/2014 01:01   10/03/2014   CLINICAL DATA:  RIGHT-sided weakness, assess for stroke. Symptoms began last evening, progressively worsening.  EXAM: MRI HEAD WITHOUT CONTRAST  MRA HEAD WITHOUT CONTRAST  TECHNIQUE: Multiplanar, multiecho pulse sequences of the brain and surrounding structures were obtained without intravenous contrast. Angiographic images of the head were obtained using MRA technique without contrast.  COMPARISON:  CT of the head October 02, 2014 at 2010 hr.  FINDINGS: MRI HEAD FINDINGS  Mild motion degraded examination.  Acute sub cm posterior limb of the internal capsule/basal ganglia lacunar infarct with decreased ADC values. Two punctate foci of susceptibility artifact within the LEFT putaminal. No midline shift or mass effect. The ventricles and sulci are normal for patient's age. Severe confluent supratentorial and to lesser extent mesial RIGHT cerebellar T2 hyperintensities, greatly advanced for age.  No abnormal extra-axial fluid collections. Mild paranasal sinus mucosal thickening without air-fluid levels. The mastoid air cells appear well-aerated. Ocular globes and orbital contents are nonsuspicious not tailored for evaluation. Mildly expanded fluid signal within the sella. No cerebellar tonsillar ectopia. No suspicious bone marrow signal.  MRA HEAD FINDINGS  Mild motion degraded examination.  Anterior circulation: Normal  flow related enhancement of the included cervical, petrous, cavernous and supra clinoid internal carotid arteries. Patent anterior communicating artery. Normal flow related enhancement of the anterior and middle cerebral arteries, including more distal segments.  Posterior circulation: RIGHT vertebral artery is dominant. Basilar artery is patent, with normal flow related enhancement of the main branch vessels. Normal flow related enhancement of the posterior cerebral arteries.  No large vessel occlusion, hemodynamically significant stenosis, abnormal luminal irregularity, aneurysm within the anterior nor posterior circulation.  IMPRESSION: Mild motion degraded examination.  MRI HEAD: Acute sub cm LEFT basal ganglia/internal capsule lacunar infarct.  Severe white matter changes may reflect chronic small vessel ischemic disease though, superimposed demyelination may have this appearance. Recommend 3 to six-month follow-up MRI of the brain. Contrast could be considered clinically indicated.  Empty sella.  MRA HEAD:  No acute vascular process ; normal MRA of the head.  Electronically Signed: By: Awilda Metro On: 10/03/2014 00:30   Mr Maxine Glenn Head/brain Wo Cm  10/03/2014   ADDENDUM REPORT: 10/03/2014 01:01  ADDENDUM: LEFT basal ganglia micro hemorrhages can be seen with chronic hypertension.   Electronically Signed   By: Awilda Metro   On: 10/03/2014 01:01   10/03/2014   CLINICAL DATA:  RIGHT-sided weakness, assess for stroke. Symptoms began last evening, progressively worsening.  EXAM: MRI HEAD WITHOUT CONTRAST  MRA HEAD WITHOUT CONTRAST  TECHNIQUE: Multiplanar, multiecho pulse sequences of the brain and surrounding structures were obtained without intravenous contrast. Angiographic images of the head were obtained using MRA technique without contrast.  COMPARISON:  CT of the head October 02, 2014 at 2010 hr.  FINDINGS: MRI HEAD FINDINGS  Mild motion degraded examination.  Acute sub cm posterior limb of the  internal capsule/basal ganglia lacunar infarct with decreased ADC values. Two  punctate foci of susceptibility artifact within the LEFT putaminal. No midline shift or mass effect. The ventricles and sulci are normal for patient's age. Severe confluent supratentorial and to lesser extent mesial RIGHT cerebellar T2 hyperintensities, greatly advanced for age.  No abnormal extra-axial fluid collections. Mild paranasal sinus mucosal thickening without air-fluid levels. The mastoid air cells appear well-aerated. Ocular globes and orbital contents are nonsuspicious not tailored for evaluation. Mildly expanded fluid signal within the sella. No cerebellar tonsillar ectopia. No suspicious bone marrow signal.  MRA HEAD FINDINGS  Mild motion degraded examination.  Anterior circulation: Normal flow related enhancement of the included cervical, petrous, cavernous and supra clinoid internal carotid arteries. Patent anterior communicating artery. Normal flow related enhancement of the anterior and middle cerebral arteries, including more distal segments.  Posterior circulation: RIGHT vertebral artery is dominant. Basilar artery is patent, with normal flow related enhancement of the main branch vessels. Normal flow related enhancement of the posterior cerebral arteries.  No large vessel occlusion, hemodynamically significant stenosis, abnormal luminal irregularity, aneurysm within the anterior nor posterior circulation.  IMPRESSION: Mild motion degraded examination.  MRI HEAD: Acute sub cm LEFT basal ganglia/internal capsule lacunar infarct.  Severe white matter changes may reflect chronic small vessel ischemic disease though, superimposed demyelination may have this appearance. Recommend 3 to six-month follow-up MRI of the brain. Contrast could be considered clinically indicated.  Empty sella.  MRA HEAD:  No acute vascular process ; normal MRA of the head.  Electronically Signed: By: Awilda Metroourtnay  Bloomer On: 10/03/2014 00:30     Assessment: 48 y.o. female hypertension and obesity presenting with acute left basal ganglia/internal capsular ischemic stroke.  Stroke Risk Factors - hypertension  Plan: 1. HgbA1c, fasting lipid panel 2. PT consult, OT consult 3. Echocardiogram 4. Carotid dopplers 5. Prophylactic therapy-Antiplatelet med: Aspirin  6. Risk factor modification 7. Studies to rule out hypercoagulable state   C.R. Roseanne RenoStewart, MD [facility] [phone]  10/03/2014, 6:43 AM

## 2014-10-03 NOTE — Evaluation (Signed)
Physical Therapy Evaluation Patient Details Name: Andrea Clements MRN: [id number] DOB: 05-09-1966 Today's Date: 10/03/2014   History of Present Illness  48 y.o. female admitted for R side weakness especially in R hand and R facial numbness. 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 PMH significant for HTN and asthma.  Clinical Impression  Patient evaluated by Physical Therapy with no further acute PT needs identified. Pt with no unsteadiness or loss of balance throughout Berg Balance assessment and Dynamic Gait Index. All education has been completed and the patient has no further questions. PT is signing off. Thank you for this referral.     Follow Up Recommendations No PT follow up    Equipment Recommendations  None recommended by PT    Recommendations for Other Services       Precautions / Restrictions        Mobility  Bed Mobility               General bed mobility comments: NT  Transfers Overall transfer level: Independent               General transfer comment: repeated x 3  Ambulation/Gait Ambulation/Gait assistance: Supervision;Independent Ambulation Distance (Feet): 300 Feet Assistive device: None Gait Pattern/deviations: Step-through pattern;Wide base of support   Gait velocity interpretation: at or above normal speed for age/gender    Stairs Stairs: Yes Stairs assistance: Modified independent (Device/Increase time) Stair Management: One rail Right;Forwards;Alternating pattern Number of Stairs: 4 General stair comments: pt prefers holding rail  Wheelchair Mobility    Modified Rankin (Stroke Patients Only) Modified Rankin (Stroke Patients Only) Pre-Morbid Rankin Score: No symptoms Modified Rankin: No significant disability     Balance Overall balance assessment: Independent                               Standardized Balance Assessment Standardized Balance Assessment : Berg Balance  Test;Dynamic Gait Index Berg Balance Test Sit to Stand: Able to stand without using hands and stabilize independently Standing Unsupported: Able to stand safely 2 minutes Sitting with Back Unsupported but Feet Supported on Floor or Stool: Able to sit safely and securely 2 minutes Stand to Sit: Sits safely with minimal use of hands Transfers: Able to transfer safely, minor use of hands Standing Unsupported with Eyes Closed: Able to stand 10 seconds safely Standing Ubsupported with Feet Together: Able to place feet together independently and stand 1 minute safely From Standing, Reach Forward with Outstretched Arm: Can reach confidently >25 cm (10") From Standing Position, Pick up Object from Floor: Able to pick up shoe safely and easily From Standing Position, Turn to Look Behind Over each Shoulder: Looks behind from both sides and weight shifts well Turn 360 Degrees: Able to turn 360 degrees safely in 4 seconds or less Standing Unsupported, Alternately Place Feet on Step/Stool: Able to stand independently and safely and complete 8 steps in 20 seconds Standing Unsupported, One Foot in Front: Able to plae foot ahead of the other independently and hold 30 seconds Standing on One Leg: Able to lift leg independently and hold equal to or more than 3 seconds Total Score: 53 Dynamic Gait Index Level Surface: Normal Change in Gait Speed: Normal Gait with Horizontal Head Turns: Normal Gait with Vertical Head Turns: Normal Gait and Pivot Turn: Normal Step Over Obstacle: Normal Step Around Obstacles: Normal Steps: Mild Impairment Total Score: 23       Pertinent  Vitals/Pain Pain Assessment: No/denies pain    Home Living Family/patient expects to be discharged to:: Private residence Living Arrangements: Spouse/significant other;Children Available Help at Discharge: Family;Available PRN/intermittently Type of Home: House Home Access: Ramped entrance     Home Layout: Two level;Able to live on  main level with bedroom/bathroom Home Equipment: None Additional Comments: Pt's husband works during the day and two adolescent children are at school. Third child is in [employer] and does not live at home.    Prior Function Level of Independence: Independent         Comments: works Multimedia programmercleaning homes     Hand Dominance   Dominant Hand: Right    Extremity/Trunk Assessment   Upper Extremity Assessment: Defer to OT evaluation           Lower Extremity Assessment: Overall WFL for tasks assessed (Rt DF, 1st toe extension 5/5; light touch intact)      Cervical / Trunk Assessment: Normal  Communication   Communication: No difficulties  Cognition Arousal/Alertness: Awake/alert Behavior During Therapy: WFL for tasks assessed/performed Overall Cognitive Status: Within Functional Limits for tasks assessed                      General Comments General comments (skin integrity, edema, etc.): Educated pt on stroke risk factors in detail. Provided handout and reviewed her own risk factors, including answering questions as appropriate (or deferring to MD)    Exercises        Assessment/Plan    PT Assessment Patent does not need any further PT services  PT Diagnosis Hemiplegia dominant side   PT Problem List    PT Treatment Interventions     PT Goals (Current goals can be found in the Care Plan section)      Frequency     Barriers to discharge        Co-evaluation               End of Session Equipment Utilized During Treatment: Gait belt Activity Tolerance: Patient tolerated treatment well Patient left: in chair;with call bell/phone within reach;with chair alarm set Nurse Communication: Mobility status (begin transfer sign-off form)    Functional Assessment Tool Used: clinical judgement Functional Limitation: Mobility: Walking and moving around Mobility: Walking and Moving Around Current Status [phone](G8978): 0 percent impaired, limited or  restricted Mobility: Walking and Moving Around Goal Status [phone](G8979): 0 percent impaired, limited or restricted Mobility: Walking and Moving Around Discharge Status [phone](G8980): 0 percent impaired, limited or restricted    Time: [id number] PT Time Calculation (min) (ACUTE ONLY): 20 min   Charges:   PT Evaluation $Initial PT Evaluation Tier I: 1 Procedure     PT G Codes:   Functional Assessment Tool Used: clinical judgement Functional Limitation: Mobility: Walking and moving around    Jamil Armwood 10/03/2014, 1:58 PM Pager [phone]

## 2014-10-03 NOTE — ED Provider Notes (Signed)
Medical screening examination/treatment/procedure(s) were conducted as a shared visit with non-physician practitioner(s) and myself.  I personally evaluated the patient during the encounter.   EKG Interpretation   Date/Time:  Monday October 02 2014 17:15:03 EST Ventricular Rate:  80 PR Interval:  184 QRS Duration: 82 QT Interval:  411 QTC Calculation: 474 R Axis:   51 Text Interpretation:  Sinus rhythm Probable left atrial enlargement Low  voltage, precordial leads Borderline repolarization abnormality Baseline  wander in lead(s) I III aVL V2 No old tracing to compare Confirmed by  [facility]  MD, TREY ([id number]) on 10/02/2014 10:09:47 PM      48 yo female with complaint of right hand clumsiness and weakness since yesterday.  On exam, well appearing, nontoxic, not distressed, normal respiratory effort, normal perfusion. right hand with 4+/5 grip strength and 4+/5 interosseous strength.  Otherwise strength 5/5.  CN II-VII intact.  Sensation intact.  Coordination and gait intact.  CT showed small lucencies.  I discussed case with Dr. Roseanne Reno (Neurology) and Dr. Alvester Morin [facility]) for admission.    Clinical Impression: 1. Right sided weakness   2. CVA (cerebral infarction)      Warnell Forester, MD 10/03/14 [phone]

## 2014-10-03 NOTE — Plan of Care (Signed)
Problem: Acute Treatment Outcomes Goal: Airway maintained/protected Outcome: Completed/Met Date Met:  10/03/14

## 2014-10-03 NOTE — Plan of Care (Signed)
Problem: Progression Outcomes Goal: Pain controlled Outcome: Completed/Met Date Met:  10/03/14

## 2014-10-03 NOTE — Plan of Care (Signed)
Problem: Acute Treatment Outcomes Goal: Hemodynamically stable Outcome: Completed/Met Date Met:  10/03/14     

## 2014-10-03 NOTE — Plan of Care (Signed)
Problem: Acute Treatment Outcomes Goal: 02 Sats > 94% Outcome: Completed/Met Date Met:  10/03/14

## 2014-10-03 NOTE — Plan of Care (Signed)
Problem: Discharge/Transitional Outcomes Goal: Independent mobility/functioning independent or with min Independent mobility/functioning independently or with minimal assistance  Outcome: Completed/Met Date Met:  10/03/14

## 2014-10-03 NOTE — Progress Notes (Signed)
Occupational Therapy Evaluation Patient Details Name: VERDIE URIAS MRN: [id number] DOB: 01-09-1966 Today's Date: 10/03/2014    History of Present Illness 48 y.o. female admitted for R side weakness especially in R hand and R facial numbness. 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 PMH significant for HTN and asthma.   Clinical Impression   Pt admitted with R hand weakness and R facial numbness. Pt currently with functional limitiations due to the deficits listed below (see OT problem list). Pt with LOB x1 ambulating to bathroom and grabbed onto door frame to regain balance. Pt educated on fine motor activities that can be done at home to increase fine motor strength and coordination. Pt will benefit from skilled OT to increase their independence and safety with adls and balance to allow discharge home.      Follow Up Recommendations  No OT follow up;Supervision - Intermittent    Equipment Recommendations  None recommended by OT    Recommendations for Other Services       Precautions / Restrictions Precautions Precautions: Fall Restrictions Weight Bearing Restrictions: No      Mobility Bed Mobility               General bed mobility comments: Pt in chair on OT arrival  Transfers Overall transfer level: Needs assistance Equipment used: None Transfers: Sit to/from Stand Sit to Stand: Supervision         General transfer comment: Supervision for safety/balance    Balance Overall balance assessment: Needs assistance Sitting-balance support: No upper extremity supported;Feet supported Sitting balance-Leahy Scale: Normal     Standing balance support: No upper extremity supported;During functional activity Standing balance-Leahy Scale: Good Standing balance comment: Slight LOB ambulating to bathroom and held onto door frame to regain balance.                            ADL Overall ADL's : Needs  assistance/impaired     Grooming: Wash/dry hands;Wash/dry face;Oral care;Supervision/safety;Standing               Lower Body Dressing: Supervision/safety;Sitting/lateral leans Lower Body Dressing Details (indicate cue type and reason): Pt able to don/doff and adjust socks. Toilet Transfer: Supervision/safety;Ambulation;Regular Toilet   Toileting- Architect and Hygiene: Supervision/safety;Sit to/from stand       Functional mobility during ADLs: Supervision/safety General ADL Comments: Pt with slight LOB while ambulating to bathroom. No reports of dizziness. Pt demonstrating decr strength and coordination with R hand, but is actively using R hand during ADL tasks. Pt educated on fine motor activities to do home incr strength and coordination.     Vision                     Perception     Praxis      Pertinent Vitals/Pain Pain Assessment: No/denies pain     Hand Dominance Right   Extremity/Trunk Assessment Upper Extremity Assessment Upper Extremity Assessment: RUE deficits/detail RUE Deficits / Details: Decr grip strength RUE Coordination: decreased fine motor   Lower Extremity Assessment Lower Extremity Assessment: Defer to PT evaluation   Cervical / Trunk Assessment Cervical / Trunk Assessment: Normal   Communication Communication Communication: No difficulties   Cognition Arousal/Alertness: Awake/alert Behavior During Therapy: WFL for tasks assessed/performed Overall Cognitive Status: Within Functional Limits for tasks assessed  General Comments       Exercises       Shoulder Instructions      Home Living Family/patient expects to be discharged to:: Private residence Living Arrangements: Spouse/significant other;Children Available Help at Discharge: Family;Available PRN/intermittently Type of Home: House Home Access: Ramped entrance     Home Layout: Two level;Able to live on main level with  bedroom/bathroom     Bathroom Shower/Tub: Tub/shower unit;Curtain Shower/tub characteristics: Engineer, building servicesCurtain Bathroom Toilet: Standard     Home Equipment: None   Additional Comments: Pt's husband works during the day and two adolescent children are at school. Third child is in [employer] and does not live at home.      Prior Functioning/Environment Level of Independence: Independent             OT Diagnosis: Hemiplegia dominant side   OT Problem List: Decreased strength;Impaired balance (sitting and/or standing);Obesity;Decreased safety awareness;Decreased coordination;Decreased knowledge of use of DME or AE   OT Treatment/Interventions: Self-care/ADL training;Therapeutic exercise;DME and/or AE instruction;Therapeutic activities;Patient/family education;Balance training    OT Goals(Current goals can be found in the care plan section) Acute Rehab OT Goals Patient Stated Goal: to go home OT Goal Formulation: With patient Time For Goal Achievement: 10/17/14 Potential to Achieve Goals: Good  OT Frequency: Min 2X/week   Barriers to D/C:            Co-evaluation              End of Session Equipment Utilized During Treatment: Gait belt Nurse Communication: Mobility status;Precautions  Activity Tolerance: Patient tolerated treatment well Patient left: in chair;with call bell/phone within reach;with chair alarm set   Time: 0920-0939 OT Time Calculation (min): 19 min Charges:    G-Codes:    Nils PyleBermel, Evadna Donaghy 10/03/2014, 9:58 AM

## 2014-10-03 NOTE — Plan of Care (Signed)
Problem: Acute Treatment Outcomes Goal: Neuro exam at baseline or improved Outcome: Completed/Met Date Met:  10/03/14

## 2014-10-04 ENCOUNTER — Encounter (HOSPITAL_COMMUNITY): Payer: Self-pay | Admitting: Cardiovascular Disease

## 2014-10-04 DIAGNOSIS — E876 Hypokalemia: Secondary | ICD-10-CM

## 2014-10-04 DIAGNOSIS — N179 Acute kidney failure, unspecified: Secondary | ICD-10-CM

## 2014-10-04 DIAGNOSIS — I119 Hypertensive heart disease without heart failure: Secondary | ICD-10-CM

## 2014-10-04 DIAGNOSIS — I10 Essential (primary) hypertension: Secondary | ICD-10-CM

## 2014-10-04 DIAGNOSIS — I638 Other cerebral infarction: Secondary | ICD-10-CM

## 2014-10-04 DIAGNOSIS — I429 Cardiomyopathy, unspecified: Secondary | ICD-10-CM

## 2014-10-04 MED ORDER — AMLODIPINE BESYLATE 5 MG PO TABS
5.0000 mg | ORAL_TABLET | Freq: Every day | ORAL | Status: DC
  Administered 2014-10-04: 5 mg via ORAL
  Filled 2014-10-04: qty 1

## 2014-10-04 MED ORDER — METOPROLOL SUCCINATE ER 100 MG PO TB24: 100.0000 mg | ORAL_TABLET | Freq: Two times a day (BID) | ORAL | Status: DC

## 2014-10-04 MED ORDER — CLOPIDOGREL BISULFATE 75 MG PO TABS: 75.0000 mg | ORAL_TABLET | Freq: Every day | ORAL | Status: DC

## 2014-10-04 MED ORDER — INDAPAMIDE 1.25 MG PO TABS
1.2500 mg | ORAL_TABLET | Freq: Every day | ORAL | Status: DC
  Administered 2014-10-04: 1.25 mg via ORAL
  Filled 2014-10-04: qty 1

## 2014-10-04 MED ORDER — AMLODIPINE BESYLATE 5 MG PO TABS: 5.0000 mg | ORAL_TABLET | Freq: Every day | ORAL | Status: DC

## 2014-10-04 MED ORDER — SIMVASTATIN 20 MG PO TABS: 20.0000 mg | ORAL_TABLET | Freq: Every day | ORAL | Status: DC

## 2014-10-04 NOTE — Progress Notes (Signed)
Occupational Therapy Treatment Patient Details Name: Andrea Clements MRN: [id number] DOB: [birth year] Today's Date: 10/04/2014    History of present illness 48 y.o. female admitted for R side weakness especially in R hand and R facial numbness. 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 PMH significant for HTN and asthma.   OT comments  Patient with no further acute OT. All education has been completed and the patient has no further questions. See below for any follow-up Occupational Therapy or equipment needs. OT to sign off.   Follow Up Recommendations  No OT follow up    Equipment Recommendations  None recommended by OT    Recommendations for Other Services      Precautions / Restrictions Precautions Precautions: Fall Restrictions Weight Bearing Restrictions: No       Mobility Bed Mobility               General bed mobility comments: Pt in chair on OT arrival  Transfers Overall transfer level: Independent                    Balance Overall balance assessment: Independent                                 ADL Overall ADL's : Needs assistance/impaired                                 Tub/ Shower Transfer: Tub transfer;Ambulation;Supervision/safety   Functional mobility during ADLs: Independent General ADL Comments: Pt completed tub transfer with supervision for safety. Pt completed dynamic balance challenging activities such as object retrieval and laundry folding with no overt LOB. Pt reporting R hand feels stronger today. Pt reminded about daily activities at home that will increase strength and coordination in RUE.      Vision                     Perception     Praxis      Cognition   Behavior During Therapy: WFL for tasks assessed/performed Overall Cognitive Status: Within Functional Limits for tasks assessed                       Extremity/Trunk Assessment                Exercises     Shoulder Instructions       General Comments      Pertinent Vitals/ Pain       Pain Assessment: No/denies pain  Home Living                                          Prior Functioning/Environment              Frequency       Progress Toward Goals  OT Goals(current goals can now be found in the care plan section)  Progress towards OT goals: Goals met/education completed, patient discharged from OT  Acute Rehab OT Goals Patient Stated Goal: to go home OT Goal Formulation: With patient Time For Goal Achievement: 10/17/14 Potential to Achieve Goals: Good ADL Goals Pt Will Perform Tub/Shower Transfer: Tub transfer;Independently;ambulating Additional ADL Goal #1: Pt will verbalize 3 activities to increase fine  motor strength and coordination.  Plan All goals met and education completed, patient discharged from OT services    Co-evaluation                 End of Session Equipment Utilized During Treatment: Gait belt   Activity Tolerance Patient tolerated treatment well   Patient Left in chair;with call bell/phone within reach;with family/visitor present   Nurse Communication Mobility status        Time: 9643-8381 OT Time Calculation (min): 12 min  Charges:    Redmond Baseman 10/04/2014, 10:35 AM

## 2014-10-04 NOTE — Consult Note (Signed)
CARDIOLOGY CONSULT NOTE   Patient ID: Andrea Clements MRN: [id number] DOB/AGE: [birth year] 48 y.o.  Admit date: 10/02/2014  Primary Physician   Geraldo Pitter, MD Primary Cardiologist   None Reason for Consultation: Newly reduced EF and CVA  HPI: Andrea Clements is a 48 y.o. female with a history of HTN, obesity, tobacco abuse, asthma and no past cardiac disease who presented to [facility] on 10/02/14 with right sided weakness and found to have an acute CVA. She had an ECHO on this admission which revealed EF 30-35% with wall motion abnormalities and cardiology was consulted.   The patient thinks she may have remotely seen a cardiologist for a possible heart murmur but does not remember. She has no past cardiac disease and is followed by primary care doctor who treats her hypertension. She has historically been on Toprol-XL, Norvasc and indapamide for difficult to control HTN. However, she ran out of her Medicaid and has not been taking the Toprol for the past couple weeks. She reports compliance with her other antihypertensives. 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 She reports no exertional chest pain or dyspnea. She denies any lower extremity swelling, orthopnea, or PND. She does have some shortness of breath but has a history of asthma for which she takes albuterol. She also has an over 25-pack-year history of tobacco abuse. She drinks alcohol moderately and does admit to smoking marijuana more frequently recently. No other illicit drug use. She has a family history of coronary disease in her father who had his first MI in his 42s.  She was admitted to the hospital on 10/02/2014 with old right-sided weakness and MRI revealed acute left basal ganglia/internal capsule lacunar infarct and severe white matter changes that may be chronic. MRA with no acute vascular process. Carotid dopplers with 1-39% ICA stenosis.   Past Medical History    Diagnosis Date  . Hypertension   . Asthma      Past Surgical History  Procedure Laterality Date  . Abdominal hysterectomy    . Cesarean section      Allergies  Allergen Reactions  . Altace [Ramipril] Anaphylaxis  . Aspirin Anaphylaxis    I have reviewed the patient's current medications . amLODipine  5 mg Oral Daily  . clopidogrel  75 mg Oral Q breakfast  . enoxaparin (LOVENOX) injection  30 mg Subcutaneous QHS  . fluticasone  1 spray Each Nare Daily  . indapamide  1.25 mg Oral Daily  . loratadine  10 mg Oral Daily  . metoprolol succinate  100 mg Oral BID  . simvastatin  20 mg Oral q1800   . sodium chloride 75 mL/hr at 10/03/14 1612   guaiFENesin-dextromethorphan, ipratropium-albuterol, senna-docusate  Prior to Admission medications   Medication Sig Start Date End Date Taking? Authorizing Provider  albuterol (PROVENTIL HFA;VENTOLIN HFA) 108 (90 BASE) MCG/ACT inhaler Inhale 2 puffs into the lungs every 6 (six) hours as needed for wheezing or shortness of breath. 11/02/13  Yes Rodolph Bong, MD  amLODipine (NORVASC) 5 MG tablet Take 5 mg by mouth daily.   Yes Historical Provider, MD  indapamide (LOZOL) 1.25 MG tablet Take 1.25 mg by mouth daily.   Yes Historical Provider, MD  metoprolol succinate (TOPROL-XL) 100 MG 24 hr tablet Take 100 mg by mouth 2 (two) times daily. Take with or immediately following a meal.   Yes Historical Provider, MD  albuterol (PROVENTIL) (2.5 MG/3ML) 0.083% nebulizer  solution Take 6 mLs (5 mg total) by nebulization every 6 (six) hours as needed for wheezing. 11/09/12   Remi HaggardAnne Crawford, NP  clopidogrel (PLAVIX) 75 MG tablet Take 1 tablet (75 mg total) by mouth daily with breakfast. 10/04/14   Ripudeep Jenna LuoK Rai, MD  HYDROcodone-acetaminophen (NORCO/VICODIN) 5-325 MG per tablet Take 0.5 tablets by mouth at bedtime as needed (cough). Patient not taking: Reported on 10/02/2014 11/02/13   Rodolph BongEvan S Corey, MD  predniSONE (DELTASONE) 50 MG tablet Take 1 tablet (50 mg  total) by mouth daily. Patient not taking: Reported on 10/02/2014 11/02/13   Rodolph BongEvan S Corey, MD  simvastatin (ZOCOR) 20 MG tablet Take 1 tablet (20 mg total) by mouth at bedtime. 10/04/14   Ripudeep Jenna LuoK Rai, MD     History   Social History  . Marital Status: Single    Spouse Name: N/A    Number of Children: N/A  . Years of Education: N/A   Occupational History  . Not on file.   Social History Main Topics  . Smoking status: Current Every Day Smoker -- 0.50 packs/day    Types: Cigarettes  . Smokeless tobacco: Not on file  . Alcohol Use: No     Comment: occ  . Drug Use: No  . Sexual Activity: Not on file   Other Topics Concern  . Not on file   Social History Narrative    No family status information on file.   History reviewed. No pertinent family history.   ROS:  Full 14 point review of systems complete and found to be negative unless listed above.  Physical Exam: Blood pressure 154/100, pulse 70, temperature 97.7 F (36.5 C), temperature source Oral, resp. rate 18, height 5\' 1"  (1.549 m), weight 264 lb (119.75 kg), SpO2 98 %.  General: Well developed, well nourished, female in no acute distress. obese Head: Eyes PERRLA, No xanthomas.   Normocephalic and atraumatic, oropharynx without edema or exudate.   Lungs: diffuse wheezing Heart: HRRR S1 S2, no rub/gallop, Heart irregular rate and rhythm with S1, S2  murmur. pulses are 2+ extrem.   Neck: No carotid bruits. No lymphadenopathy.  No JVD. Abdomen: Bowel sounds present, abdomen soft and non-tender without masses or hernias noted. Msk:  No spine or cva tenderness. No weakness, no joint deformities or effusions. Extremities: No clubbing or cyanosis. Trace edema.  Neuro: Alert and oriented X 3. No focal deficits noted. Psych:  Good affect, responds appropriately Skin: No rashes or lesions noted.  Labs:   Lab Results  Component Value Date   WBC 5.2 10/02/2014   HGB 14.3 10/02/2014   HCT 43.1 10/02/2014   MCV 88.1  10/02/2014   PLT 225 10/02/2014    Recent Labs  10/02/14 1719  INR 0.98    Recent Labs Lab 10/02/14 1719 10/03/14 0730  NA 139 140  K 3.0* 3.4*  CL 98 101  CO2 27 25  BUN 19 16  CREATININE 1.75* 1.51*  CALCIUM 9.3 8.7  PROT 8.2  --   BILITOT 0.3  --   ALKPHOS 81  --   ALT 19  --   AST 18  --   GLUCOSE 101* 105*  ALBUMIN 3.8  --    MAGNESIUM  Date Value Ref Range Status  10/02/2014 2.1 1.5 - 2.5 mg/dL Final     Recent Labs  10/02/14 1731  TROPIPOC 0.05    Lab Results  Component Value Date   CHOL 198 10/03/2014   HDL 35* 10/03/2014  LDLCALC 127* 10/03/2014   TRIG 180* 10/03/2014    Echo:   Study Date: 10/03/2014 LV EF: 30% -   35% Study Conclusions - Left ventricle: The cavity size was normal. There was mild   concentric hypertrophy. Systolic function was moderately to   severely reduced. The estimated ejection fraction was in the   range of 30% to 35%. There is akinesis of the inferolateral,   inferior, and inferoseptal myocardium. Features are consistent   with a pseudonormal left ventricular filling pattern, with   concomitant abnormal relaxation and increased filling pressure   (grade 2 diastolic dysfunction). - Aortic valve: There was trivial regurgitation. - Left atrium: The atrium was mildly dilated. Impressions: - No cardiac source of emboli was indentified.   ECG: HR 80: Sinus rhythm Probable left atrial enlargement Low voltage, precordial leads Borderline repolarization abnormality  Radiology:  Dg Chest 2 View  10/03/2014   CLINICAL DATA:  CVA.  Weakness.  EXAM: CHEST  2 VIEW  COMPARISON:  01/01/2013  FINDINGS: Cardiopericardial enlargement which is similar to 2014. Negative aortic contours. Vascular prominence of the hila, also similar to previous. No pulmonary edema, effusion, or pneumothorax.  IMPRESSION: Chronic cardiopericardial enlargement.   Electronically Signed   By: Tiburcio Pea M.D.   On: 10/03/2014 00:56   Ct Head Wo  Contrast  10/02/2014   CLINICAL DATA:  Right-sided weakness since yesterday.  EXAM: CT HEAD WITHOUT CONTRAST  TECHNIQUE: Contiguous axial images were obtained from the base of the skull through the vertex without intravenous contrast.  COMPARISON:  None.  FINDINGS: There is no acute hemorrhage or mass. There is periventricular white matter lucency in both frontal lobes, left greater than right. Brain parenchyma is otherwise normal. Ventricles are not dilated. No osseous abnormality.  IMPRESSION: White matter lucency in the frontal lobes, probably representing small vessel ischemic disease. However, the lucency in the left frontal lobe could represent a subacute infarct.   Electronically Signed   By: Geanie Cooley M.D.   On: 10/02/2014 20:50   Mr Brain Wo Contrast  10/03/2014   ADDENDUM REPORT: 10/03/2014 01:01  ADDENDUM: LEFT basal ganglia micro hemorrhages can be seen with chronic hypertension.   Electronically Signed   By: Awilda Metro   On: 10/03/2014 01:01   10/03/2014   CLINICAL DATA:  RIGHT-sided weakness, assess for stroke. Symptoms began last evening, progressively worsening.  EXAM: MRI HEAD WITHOUT CONTRAST  MRA HEAD WITHOUT CONTRAST  TECHNIQUE: Multiplanar, multiecho pulse sequences of the brain and surrounding structures were obtained without intravenous contrast. Angiographic images of the head were obtained using MRA technique without contrast.  COMPARISON:  CT of the head October 02, 2014 at 2010 hr.  FINDINGS: MRI HEAD FINDINGS  Mild motion degraded examination.  Acute sub cm posterior limb of the internal capsule/basal ganglia lacunar infarct with decreased ADC values. Two punctate foci of susceptibility artifact within the LEFT putaminal. No midline shift or mass effect. The ventricles and sulci are normal for patient's age. Severe confluent supratentorial and to lesser extent mesial RIGHT cerebellar T2 hyperintensities, greatly advanced for age.  No abnormal extra-axial fluid  collections. Mild paranasal sinus mucosal thickening without air-fluid levels. The mastoid air cells appear well-aerated. Ocular globes and orbital contents are nonsuspicious not tailored for evaluation. Mildly expanded fluid signal within the sella. No cerebellar tonsillar ectopia. No suspicious bone marrow signal.  MRA HEAD FINDINGS  Mild motion degraded examination.  Anterior circulation: Normal flow related enhancement of the included cervical, petrous, cavernous and  supra clinoid internal carotid arteries. Patent anterior communicating artery. Normal flow related enhancement of the anterior and middle cerebral arteries, including more distal segments.  Posterior circulation: RIGHT vertebral artery is dominant. Basilar artery is patent, with normal flow related enhancement of the main branch vessels. Normal flow related enhancement of the posterior cerebral arteries.  No large vessel occlusion, hemodynamically significant stenosis, abnormal luminal irregularity, aneurysm within the anterior nor posterior circulation.  IMPRESSION: Mild motion degraded examination.  MRI HEAD: Acute sub cm LEFT basal ganglia/internal capsule lacunar infarct.  Severe white matter changes may reflect chronic small vessel ischemic disease though, superimposed demyelination may have this appearance. Recommend 3 to six-month follow-up MRI of the brain. Contrast could be considered clinically indicated.  Empty sella.  MRA HEAD:  No acute vascular process ; normal MRA of the head.  Electronically Signed: By: Awilda Metro On: 10/03/2014 00:30   Mr Maxine Glenn Head/brain Wo Cm  10/03/2014   ADDENDUM REPORT: 10/03/2014 01:01  ADDENDUM: LEFT basal ganglia micro hemorrhages can be seen with chronic hypertension.   Electronically Signed   By: Awilda Metro   On: 10/03/2014 01:01   10/03/2014   CLINICAL DATA:  RIGHT-sided weakness, assess for stroke. Symptoms began last evening, progressively worsening.  EXAM: MRI HEAD WITHOUT CONTRAST   MRA HEAD WITHOUT CONTRAST  TECHNIQUE: Multiplanar, multiecho pulse sequences of the brain and surrounding structures were obtained without intravenous contrast. Angiographic images of the head were obtained using MRA technique without contrast.  COMPARISON:  CT of the head October 02, 2014 at 2010 hr.  FINDINGS: MRI HEAD FINDINGS  Mild motion degraded examination.  Acute sub cm posterior limb of the internal capsule/basal ganglia lacunar infarct with decreased ADC values. Two punctate foci of susceptibility artifact within the LEFT putaminal. No midline shift or mass effect. The ventricles and sulci are normal for patient's age. Severe confluent supratentorial and to lesser extent mesial RIGHT cerebellar T2 hyperintensities, greatly advanced for age.  No abnormal extra-axial fluid collections. Mild paranasal sinus mucosal thickening without air-fluid levels. The mastoid air cells appear well-aerated. Ocular globes and orbital contents are nonsuspicious not tailored for evaluation. Mildly expanded fluid signal within the sella. No cerebellar tonsillar ectopia. No suspicious bone marrow signal.  MRA HEAD FINDINGS  Mild motion degraded examination.  Anterior circulation: Normal flow related enhancement of the included cervical, petrous, cavernous and supra clinoid internal carotid arteries. Patent anterior communicating artery. Normal flow related enhancement of the anterior and middle cerebral arteries, including more distal segments.  Posterior circulation: RIGHT vertebral artery is dominant. Basilar artery is patent, with normal flow related enhancement of the main branch vessels. Normal flow related enhancement of the posterior cerebral arteries.  No large vessel occlusion, hemodynamically significant stenosis, abnormal luminal irregularity, aneurysm within the anterior nor posterior circulation.  IMPRESSION: Mild motion degraded examination.  MRI HEAD: Acute sub cm LEFT basal ganglia/internal capsule lacunar  infarct.  Severe white matter changes may reflect chronic small vessel ischemic disease though, superimposed demyelination may have this appearance. Recommend 3 to six-month follow-up MRI of the brain. Contrast could be considered clinically indicated.  Empty sella.  MRA HEAD:  No acute vascular process ; normal MRA of the head.  Electronically Signed: By: Awilda Metro On: 10/03/2014 00:30    ASSESSMENT AND PLAN:    Principal Problem:   CVA (cerebral infarction) Active Problems:   AKI (acute kidney injury)   Hypokalemia   Essential hypertension   Obesity   Right sided weakness  Andrea  Judie Petit Clements is a 48 y.o. female with a history of HTN, obesity, tobacco abuse, asthma and no past cardiac disease who presented to [facility] on 10/02/14 with right sided weakness and found to have an acute CVA. She had an ECHO on this admission which revealed EF 30-35% with wall motion abnormalities and cardiology was consulted.   Newly reduced EF-  ECHO on 10/03/14 w/ EF 30-35%, mild concentric hypertrophy, akinesis of inferolateral, inferior and inferoseptal myocrdium. G2DD, mild LA dilation. No cardiac source of emboli identified. -- No s/s volume overload  -- Consider adding an ACE when AKI resolves, esp in the setting of pre diabetes and uncontrolled HTN -- New cardiomyopathy could be from untreated HTN, but CAD cannot be eliminated as a possible eitiology. She does have RFs such as obesity, HLD, pre diabetes, HTN and family history of CAD (father with MI in  67s). -- She is not having angina. Will treat her medically and then have her follow up in the clinic in 1 week after discharge. Will repeat ECHO in 3 months and if no improvement in LV function will proceed with further ischemic work up.  -- Continue plavix ( ASA allergy), Toprol XL and statin  CVA -presenting with right hand and foot weakness. Still some residual weakness but much improved -- MRI of the brain showed acute subcentimeter left basal  ganglia/internal capsule lacunar infarct, severe white matter changes, may reflect chronic small vessel ischemic disease though superimposed demyelination may have this appearance, recommend 3-6 month follow-up MRI -- MRA showed no acute vascular process normal MRA -- Carotid Dopplers w/ 1-39% ICA stenosis bilaterally -- Lipid panel showed cholesterol 198, triglycerides 180, LDL 127, placed on statin -- Allergic to aspirin (per patient- tongue swelling), placed on Plavix  AKI  -- Creatinine function improving,1.75--> 1.5, continue gentle hydration  Hypokalemia -- Replaced per IM  Essential hypertension- Not very well controlled.  -- Continue Toprol-XL, will restart Norvasc, also on indapamide -- Consider adding an ACE when AKI resolves  Obesity -- Patient counseled on diet and weight control  Asthma : Mild wheezing  -- Continue tx per IM.   Pre-diabetes -- HgA1c 5.9 -- Counseling on diet and exercise.    HLD- TC 198; TG 180; HDL 35; LDL 127. -- Continue simvastatin.   Tobacco abuse- counseled on cessation.     SignedAllena Katz 10/04/2014 11:56 AM  Pager [phone]  Co-Sign MD  Patient seen, examined. Available data reviewed. Agree with findings, assessment, and plan as outlined by Carlean Jews, PA-C. The patient was independently interviewed and examined. Exam demonstrates a pleasant, obese woman in no distress. Lungs have rhonchi bilaterally. Heart is regular rate and rhythm without murmur or gallop. Jugular venous pressure is normal. There are no carotid bruits. There is no lower extremity edema. I have reviewed the patients vital signs, laboratory data, and echocardiogram findings. She has a newly diagnosed cardiomyopathy in the context of long-standing uncontrolled hypertension. She does have a segmental pattern to her LV dysfunction, primarily with inferior wall akinesis, but hypokinesis of other segments. Her EKG does not show any evidence of  previous inferior MI. The patient has no symptoms of angina or heart failure. Obstructive CAD is also in the differential for her segmental LV dysfunction, but evaluation is somewhat difficult because of her body habitus I think stress testing would be of little benefit. Also have concerns about using pharmacologic agents for stress testing in the context of asthma and rhonchi on her exam.  I would favor medical therapy with strict control of her blood pressure. Cardiac catheterization was discussed with the patient, but with her acute stroke I think the potential risks probably outweigh the benefits of this study. Favor an approach with medical therapy and follow-up echocardiogram in 3 months. If she continues to have severe residual LV dysfunction, cardiac catheterization will be indicated. Consider the addition of an ACE or ARB at the time of early follow-up. Because she had acute kidney injury, would avoid initiation of these agents at this time. She should otherwise continue on her current medical therapy which was reviewed today. Will arrange a follow-up visit within 2 weeks with an APP at our office, then I will plan on seeing her back in 3 months after her repeat echo is completed.   Tonny Bollman, M.D. 10/04/2014 1:47 PM

## 2014-10-04 NOTE — Discharge Summary (Signed)
Physician Discharge Summary  Patient ID: Andrea Clements MRN: [id number] DOB/AGE: 1966-07-07 48 y.o.  Admit date: 10/02/2014 Discharge date: 10/04/2014  Primary Care Physician:  Geraldo PitterBLAND,VEITA J, MD  Discharge Diagnoses:    . 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 AKI (acute kidney injury) . Hypokalemia . Essential hypertension-uncontrolled  . Obesity   Consults:  Neurology Dr. Pearlean BrownieSethi Cardiology, Dr. Excell Seltzerooper   Recommendations for Outpatient Follow-up:  1) Patient was started on Plavix given she has aspirin allergy for secondary stroke prevention 2) new medication: Simvastatin 3) cardiology recommended medical therapy with strict control of BP, follow-up echocardiogram in 3 months, consider addition of ACEI/ ARB at time of follow-up if cr function has improved  Allergies:   Allergies  Allergen Reactions  . Altace [Ramipril] Anaphylaxis  . Aspirin Anaphylaxis     Discharge Medications:   Medication List    STOP taking these medications        predniSONE 50 MG tablet  Commonly known as:  DELTASONE      TAKE these medications        albuterol (2.5 MG/3ML) 0.083% nebulizer solution  Commonly known as:  PROVENTIL  Take 6 mLs (5 mg total) by nebulization every 6 (six) hours as needed for wheezing.     albuterol 108 (90 BASE) MCG/ACT inhaler  Commonly known as:  PROVENTIL HFA;VENTOLIN HFA  Inhale 2 puffs into the lungs every 6 (six) hours as needed for wheezing or shortness of breath.     amLODipine 5 MG tablet  Commonly known as:  NORVASC  Take 1 tablet (5 mg total) by mouth daily.     clopidogrel 75 MG tablet  Commonly known as:  PLAVIX  Take 1 tablet (75 mg total) by mouth daily with breakfast.     HYDROcodone-acetaminophen 5-325 MG per tablet  Commonly known as:  NORCO/VICODIN  Take 0.5 tablets by mouth at bedtime as needed (cough).      indapamide 1.25 MG tablet  Commonly known as:  LOZOL  Take 1.25 mg by mouth daily.     metoprolol succinate 100 MG 24 hr tablet  Commonly known as:  TOPROL-XL  Take 1 tablet (100 mg total) by mouth 2 (two) times daily. Take with or immediately following a meal.     simvastatin 20 MG tablet  Commonly known as:  ZOCOR  Take 1 tablet (20 mg total) by mouth at bedtime.         Brief H and P: For complete details please refer to admission H and P, but in brief patient is a 48 year old female with hypertension, obesity, tobacco abuse, asthma presented with right hand and right foot weakness over the past 2-3 days prior to admission, otherwise denied any confusion, slurred speech. She does not take aspirin, presented to ER as symptoms persisted. Patient was also noted to be hypokalemic with potassium of 3.0 and creatinine 1.75. CT head showed white matter lucency in the frontal lobes, probably small vessel ischemic disease however that lucency in the left frontal lobe could represent a subacute infarct. Patient was admitted for stroke workup.  Hospital Course:   CVA (cerebral infarction) presenting with right hand and foot weakness symptoms have significantly improved   MRI of the brain showed acute subcentimeter left basal ganglia/internal capsule lacunar infarct, severe white matter changes, may reflect chronic small vessel ischemic disease though superimposed demyelination may have this appearance, recommend 3-6 month follow-up  MRI MRA showed no acute vascular process normal MRA 2-D echo showed EF of 30-35%, akinesis in the inferior lateral, inferior and inferoseptal myocardium, grade 2 diastolic dysfunction Carotid Dopplers showed 1-39% ICA stenosis bilaterally  Lipid panel showed cholesterol 198, triglycerides 180, LDL 127, placed on statins Allergic to aspirin (per patient- tongue swelling), placed on Plavix Hemoglobin A1c 5.9 PT OT recommended no PT follow-up     AKI (acute kidney  injury) -Creatinine function improving, 1.5, was placed with IV fluids, unable to start patient on ACE inhibitor/ARB secondary to acute renal insufficiency     Hypokalemia- Replaced   Essential hypertension- uncontrolled  -Continue Toprol-Xcontinue Norvasc and indapamide   Obesity - Patient counseled on diet and weight control  Asthma :  patient had mild wheezing hence was placed on scheduled nebs, Claritin, Robitussin and Flonase. She can continue albuterol inhaler outpatient.      Cardiomyopathy due to hypertension, without heart failure - 2-D echocardiogram was obtained as part of CVA workup which showed EF of 30-35%, EKG did not show any evidence of previous inferior MI, patient had no active cardiac symptoms. Given the new diagnosis of combined systolic and diastolic CHF/cardiomyopathy, cardiology consult was obtained. Patient was seen by Dr. Excell Seltzer, recommended medical therapy with strict BP control at this time. Cardiac cath was discussed however with her acute stroke at this time, risk outweighed the benefits, recommended 2-D echo in 3 months. Start ACEI/ARB once creatinine function has improved. Office will call the patient with appointment in 2 weeks.   Day of Discharge BP 155/87 mmHg  Pulse 72  Temp(Src) 98.1 F (36.7 C) (Oral)  Resp 18  Ht 5\' 1"  (1.549 m)  Wt 119.75 kg (264 lb)  BMI 49.91 kg/m2  SpO2 99%  Physical Exam: General: Alert and awake oriented x3 not in any acute distress. HEENT: anicteric sclera, pupils reactive to light and accommodation CVS: S1-S2 clear no murmur rubs or gallops Chest: clear to auscultation bilaterally, no wheezing rales or rhonchi Abdomen:  obese, soft nontender, nondistended, normal bowel sounds Extremities: no cyanosis, clubbing or edema noted bilaterally Neuro: Cranial nerves II-XII intact, no focal neurological deficitsSubtle right hand grip weakness improving    The results of significant diagnostics from this  hospitalization (including imaging, microbiology, ancillary and laboratory) are listed below for reference.    LAB RESULTS: Basic Metabolic Panel:  Recent Labs Lab 10/02/14 1719 10/02/14 1933 10/03/14 0730  NA 139  --  140  K 3.0*  --  3.4*  CL 98  --  101  CO2 27  --  25  GLUCOSE 101*  --  105*  BUN 19  --  16  CREATININE 1.75*  --  1.51*  CALCIUM 9.3  --  8.7  MG  --  2.1  --    Liver Function Tests:  Recent Labs Lab 10/02/14 1719  AST 18  ALT 19  ALKPHOS 81  BILITOT 0.3  PROT 8.2  ALBUMIN 3.8   No results for input(s): LIPASE, AMYLASE in the last 168 hours. No results for input(s): AMMONIA in the last 168 hours. CBC:  Recent Labs Lab 10/02/14 1719  WBC 5.2  NEUTROABS 2.7  HGB 14.3  HCT 43.1  MCV 88.1  PLT 225   Cardiac Enzymes: No results for input(s): CKTOTAL, CKMB, CKMBINDEX, TROPONINI in the last 168 hours. BNP: Invalid input(s): POCBNP CBG:  Recent Labs Lab 10/02/14 1717  GLUCAP 102*    Significant Diagnostic Studies:  Dg Chest 2 View  10/03/2014  CLINICAL DATA:  CVA.  Weakness.  EXAM: CHEST  2 VIEW  COMPARISON:  01/01/2013  FINDINGS: Cardiopericardial enlargement which is similar to 2014. Negative aortic contours. Vascular prominence of the hila, also similar to previous. No pulmonary edema, effusion, or pneumothorax.  IMPRESSION: Chronic cardiopericardial enlargement.   Electronically Signed   By: Tiburcio Pea M.D.   On: 10/03/2014 00:56   Ct Head Wo Contrast  10/02/2014   CLINICAL DATA:  Right-sided weakness since yesterday.  EXAM: CT HEAD WITHOUT CONTRAST  TECHNIQUE: Contiguous axial images were obtained from the base of the skull through the vertex without intravenous contrast.  COMPARISON:  None.  FINDINGS: There is no acute hemorrhage or mass. There is periventricular white matter lucency in both frontal lobes, left greater than right. Brain parenchyma is otherwise normal. Ventricles are not dilated. No osseous abnormality.   IMPRESSION: White matter lucency in the frontal lobes, probably representing small vessel ischemic disease. However, the lucency in the left frontal lobe could represent a subacute infarct.   Electronically Signed   By: Geanie Cooley M.D.   On: 10/02/2014 20:50   Mr Brain Wo Contrast  10/03/2014   ADDENDUM REPORT: 10/03/2014 01:01  ADDENDUM: LEFT basal ganglia micro hemorrhages can be seen with chronic hypertension.   Electronically Signed   By: Awilda Metro   On: 10/03/2014 01:01   10/03/2014   CLINICAL DATA:  RIGHT-sided weakness, assess for stroke. Symptoms began last evening, progressively worsening.  EXAM: MRI HEAD WITHOUT CONTRAST  MRA HEAD WITHOUT CONTRAST  TECHNIQUE: Multiplanar, multiecho pulse sequences of the brain and surrounding structures were obtained without intravenous contrast. Angiographic images of the head were obtained using MRA technique without contrast.  COMPARISON:  CT of the head October 02, 2014 at 2010 hr.  FINDINGS: MRI HEAD FINDINGS  Mild motion degraded examination.  Acute sub cm posterior limb of the internal capsule/basal ganglia lacunar infarct with decreased ADC values. Two punctate foci of susceptibility artifact within the LEFT putaminal. No midline shift or mass effect. The ventricles and sulci are normal for patient's age. Severe confluent supratentorial and to lesser extent mesial RIGHT cerebellar T2 hyperintensities, greatly advanced for age.  No abnormal extra-axial fluid collections. Mild paranasal sinus mucosal thickening without air-fluid levels. The mastoid air cells appear well-aerated. Ocular globes and orbital contents are nonsuspicious not tailored for evaluation. Mildly expanded fluid signal within the sella. No cerebellar tonsillar ectopia. No suspicious bone marrow signal.  MRA HEAD FINDINGS  Mild motion degraded examination.  Anterior circulation: Normal flow related enhancement of the included cervical, petrous, cavernous and supra clinoid internal  carotid arteries. Patent anterior communicating artery. Normal flow related enhancement of the anterior and middle cerebral arteries, including more distal segments.  Posterior circulation: RIGHT vertebral artery is dominant. Basilar artery is patent, with normal flow related enhancement of the main branch vessels. Normal flow related enhancement of the posterior cerebral arteries.  No large vessel occlusion, hemodynamically significant stenosis, abnormal luminal irregularity, aneurysm within the anterior nor posterior circulation.  IMPRESSION: Mild motion degraded examination.  MRI HEAD: Acute sub cm LEFT basal ganglia/internal capsule lacunar infarct.  Severe white matter changes may reflect chronic small vessel ischemic disease though, superimposed demyelination may have this appearance. Recommend 3 to six-month follow-up MRI of the brain. Contrast could be considered clinically indicated.  Empty sella.  MRA HEAD:  No acute vascular process ; normal MRA of the head.  Electronically Signed: By: Awilda Metro On: 10/03/2014 00:30   Mr Maxine Glenn Head/brain  Wo Cm  10/03/2014   ADDENDUM REPORT: 10/03/2014 01:01  ADDENDUM: LEFT basal ganglia micro hemorrhages can be seen with chronic hypertension.   Electronically Signed   By: Awilda Metro   On: 10/03/2014 01:01   10/03/2014   CLINICAL DATA:  RIGHT-sided weakness, assess for stroke. Symptoms began last evening, progressively worsening.  EXAM: MRI HEAD WITHOUT CONTRAST  MRA HEAD WITHOUT CONTRAST  TECHNIQUE: Multiplanar, multiecho pulse sequences of the brain and surrounding structures were obtained without intravenous contrast. Angiographic images of the head were obtained using MRA technique without contrast.  COMPARISON:  CT of the head October 02, 2014 at 2010 hr.  FINDINGS: MRI HEAD FINDINGS  Mild motion degraded examination.  Acute sub cm posterior limb of the internal capsule/basal ganglia lacunar infarct with decreased ADC values. Two punctate foci of  susceptibility artifact within the LEFT putaminal. No midline shift or mass effect. The ventricles and sulci are normal for patient's age. Severe confluent supratentorial and to lesser extent mesial RIGHT cerebellar T2 hyperintensities, greatly advanced for age.  No abnormal extra-axial fluid collections. Mild paranasal sinus mucosal thickening without air-fluid levels. The mastoid air cells appear well-aerated. Ocular globes and orbital contents are nonsuspicious not tailored for evaluation. Mildly expanded fluid signal within the sella. No cerebellar tonsillar ectopia. No suspicious bone marrow signal.  MRA HEAD FINDINGS  Mild motion degraded examination.  Anterior circulation: Normal flow related enhancement of the included cervical, petrous, cavernous and supra clinoid internal carotid arteries. Patent anterior communicating artery. Normal flow related enhancement of the anterior and middle cerebral arteries, including more distal segments.  Posterior circulation: RIGHT vertebral artery is dominant. Basilar artery is patent, with normal flow related enhancement of the main branch vessels. Normal flow related enhancement of the posterior cerebral arteries.  No large vessel occlusion, hemodynamically significant stenosis, abnormal luminal irregularity, aneurysm within the anterior nor posterior circulation.  IMPRESSION: Mild motion degraded examination.  MRI HEAD: Acute sub cm LEFT basal ganglia/internal capsule lacunar infarct.  Severe white matter changes may reflect chronic small vessel ischemic disease though, superimposed demyelination may have this appearance. Recommend 3 to six-month follow-up MRI of the brain. Contrast could be considered clinically indicated.  Empty sella.  MRA HEAD:  No acute vascular process ; normal MRA of the head.  Electronically Signed: By: Awilda Metro On: 10/03/2014 00:30    2D ECHO: Study Conclusions  - Left ventricle: The cavity size was normal. There was  mild concentric hypertrophy. Systolic function was moderately to severely reduced. The estimated ejection fraction was in the range of 30% to 35%. There is akinesis of the inferolateral, inferior, and inferoseptal myocardium. Features are consistent with a pseudonormal left ventricular filling pattern, with concomitant abnormal relaxation and increased filling pressure (grade 2 diastolic dysfunction). - Aortic valve: There was trivial regurgitation. - Left atrium: The atrium was mildly dilated.  Impressions:  - No cardiac source of emboli was indentified.   Disposition and Follow-up:     Discharge Instructions    Diet - low sodium heart healthy    Complete by:  As directed      Increase activity slowly    Complete by:  As directed             DISPOSITION: home  DIET: Heart healthy diet    TESTS THAT NEED FOLLOW-UP BMET  DISCHARGE FOLLOW-UP Follow-up Information    Follow up with SETHI,PRAMOD, MD. Schedule an appointment as soon as possible for a visit in 2 months.   Specialties:  Neurology, Radiology   Why:  for hospital follow-up, stroke clinic   Contact information:   [address] [place] [address] [phone]       Follow up with [facility]     On 10/10/2014.   Why:  at 9am; Eligibility for financial counseling Dec 18,2015 at 2:30pm; please try to keep your apt or call to reschedule   Contact information:   [address] [place] [address] [phone]      Follow up with Tonny Bollman, MD In 2 weeks.   Specialty:  Cardiology   Why:  Office will call you with an appointment   Contact information:   [address] [place] [address] [phone]       Time spent on Discharge: 40 MINS  Signed:   RAI,RIPUDEEP M.D. [facility] 10/04/2014, 2:13 PM Pager: [phone]

## 2014-10-04 NOTE — Progress Notes (Signed)
STROKE TEAM PROGRESS NOTE   HISTORY Andrea Clements is an 48 y.o. female with a history of hypertension and obesity who presented to the emergency weakness right hand and face. Onset was at 1:00 PM on 10/01/2014. There's no previous history of stroke or TIA. She has not therapy. MRI of the brain showed a small subcentimeter left basal ganglia/capsular ischemic infarction. MRA of the head. NIH stroke score was 2. Patient was not administered TPA secondary to Beyond time window for treatment consideration. She was admitted for further evaluation and treatment.   SUBJECTIVE (INTERVAL HISTORY) No family is at the bedside.  Overall she feels her condition is stable. dopplers   OBJECTIVE Temp:  [97.7 F (36.5 C)-98.8 F (37.1 C)] 97.7 F (36.5 C) (11/25 0947) Pulse Rate:  [64-88] 70 (11/25 0947) Cardiac Rhythm:  [-] Normal sinus rhythm (11/25 1100) Resp:  [18] 18 (11/25 0947) BP: (126-169)/(58-100) 154/100 mmHg (11/25 0947) SpO2:  [94 %-98 %] 98 % (11/25 0947)   Recent Labs Lab 10/02/14 1717  GLUCAP 102*    Recent Labs Lab 10/02/14 1719 10/02/14 1933 10/03/14 0730  NA 139  --  140  K 3.0*  --  3.4*  CL 98  --  101  CO2 27  --  25  GLUCOSE 101*  --  105*  BUN 19  --  16  CREATININE 1.75*  --  1.51*  CALCIUM 9.3  --  8.7  MG  --  2.1  --     Recent Labs Lab 10/02/14 1719  AST 18  ALT 19  ALKPHOS 81  BILITOT 0.3  PROT 8.2  ALBUMIN 3.8    Recent Labs Lab 10/02/14 1719  WBC 5.2  NEUTROABS 2.7  HGB 14.3  HCT 43.1  MCV 88.1  PLT 225   No results for input(s): CKTOTAL, CKMB, CKMBINDEX, TROPONINI in the last 168 hours.  Recent Labs  10/02/14 1719  LABPROT 13.1  INR 0.98   No results for input(s): COLORURINE, LABSPEC, PHURINE, GLUCOSEU, HGBUR, BILIRUBINUR, KETONESUR, PROTEINUR, UROBILINOGEN, NITRITE, LEUKOCYTESUR in the last 72 hours.  Invalid input(s): APPERANCEUR     Component Value Date/Time   CHOL 198 10/03/2014 0730   TRIG 180* 10/03/2014 0730   HDL 35* 10/03/2014 0730   CHOLHDL 5.7 10/03/2014 0730   VLDL 36 10/03/2014 0730   LDLCALC 127* 10/03/2014 0730   Lab Results  Component Value Date   HGBA1C 5.9* 10/03/2014   No results found for: LABOPIA, COCAINSCRNUR, LABBENZ, AMPHETMU, THCU, LABBARB  No results for input(s): ETH in the last 168 hours.  Dg Chest 2 View  10/03/2014   CLINICAL DATA:  CVA.  Weakness.  EXAM: CHEST  2 VIEW  COMPARISON:  01/01/2013  FINDINGS: Cardiopericardial enlargement which is similar to 2014. Negative aortic contours. Vascular prominence of the hila, also similar to previous. No pulmonary edema, effusion, or pneumothorax.  IMPRESSION: Chronic cardiopericardial enlargement.   Electronically Signed   By: Tiburcio Pea M.D.   On: 10/03/2014 00:56   Ct Head Wo Contrast  10/02/2014   CLINICAL DATA:  Right-sided weakness since yesterday.  EXAM: CT HEAD WITHOUT CONTRAST  TECHNIQUE: Contiguous axial images were obtained from the base of the skull through the vertex without intravenous contrast.  COMPARISON:  None.  FINDINGS: There is no acute hemorrhage or mass. There is periventricular white matter lucency in both frontal lobes, left greater than right. Brain parenchyma is otherwise normal. Ventricles are not dilated. No osseous abnormality.  IMPRESSION: White matter lucency in  the frontal lobes, probably representing small vessel ischemic disease. However, the lucency in the left frontal lobe could represent a subacute infarct.   Electronically Signed   By: Geanie CooleyJim  Maxwell M.D.   On: 10/02/2014 20:50   Mr Brain Wo Contrast  10/03/2014   ADDENDUM REPORT: 10/03/2014 01:01  ADDENDUM: LEFT basal ganglia micro hemorrhages can be seen with chronic hypertension.   Electronically Signed   By: Awilda Metroourtnay  Bloomer   On: 10/03/2014 01:01   10/03/2014   CLINICAL DATA:  RIGHT-sided weakness, assess for stroke. Symptoms began last evening, progressively worsening.  EXAM: MRI HEAD WITHOUT CONTRAST  MRA HEAD WITHOUT CONTRAST   TECHNIQUE: Multiplanar, multiecho pulse sequences of the brain and surrounding structures were obtained without intravenous contrast. Angiographic images of the head were obtained using MRA technique without contrast.  COMPARISON:  CT of the head October 02, 2014 at 2010 hr.  FINDINGS: MRI HEAD FINDINGS  Mild motion degraded examination.  Acute sub cm posterior limb of the internal capsule/basal ganglia lacunar infarct with decreased ADC values. Two punctate foci of susceptibility artifact within the LEFT putaminal. No midline shift or mass effect. The ventricles and sulci are normal for patient's age. Severe confluent supratentorial and to lesser extent mesial RIGHT cerebellar T2 hyperintensities, greatly advanced for age.  No abnormal extra-axial fluid collections. Mild paranasal sinus mucosal thickening without air-fluid levels. The mastoid air cells appear well-aerated. Ocular globes and orbital contents are nonsuspicious not tailored for evaluation. Mildly expanded fluid signal within the sella. No cerebellar tonsillar ectopia. No suspicious bone marrow signal.  MRA HEAD FINDINGS  Mild motion degraded examination.  Anterior circulation: Normal flow related enhancement of the included cervical, petrous, cavernous and supra clinoid internal carotid arteries. Patent anterior communicating artery. Normal flow related enhancement of the anterior and middle cerebral arteries, including more distal segments.  Posterior circulation: RIGHT vertebral artery is dominant. Basilar artery is patent, with normal flow related enhancement of the main branch vessels. Normal flow related enhancement of the posterior cerebral arteries.  No large vessel occlusion, hemodynamically significant stenosis, abnormal luminal irregularity, aneurysm within the anterior nor posterior circulation.  IMPRESSION: Mild motion degraded examination.  MRI HEAD: Acute sub cm LEFT basal ganglia/internal capsule lacunar infarct.  Severe white matter  changes may reflect chronic small vessel ischemic disease though, superimposed demyelination may have this appearance. Recommend 3 to six-month follow-up MRI of the brain. Contrast could be considered clinically indicated.  Empty sella.  MRA HEAD:  No acute vascular process ; normal MRA of the head.  Electronically Signed: By: Awilda Metroourtnay  Bloomer On: 10/03/2014 00:30   Mr Maxine GlennMra Head/brain Wo Cm  10/03/2014   ADDENDUM REPORT: 10/03/2014 01:01  ADDENDUM: LEFT basal ganglia micro hemorrhages can be seen with chronic hypertension.   Electronically Signed   By: Awilda Metroourtnay  Bloomer   On: 10/03/2014 01:01   10/03/2014   CLINICAL DATA:  RIGHT-sided weakness, assess for stroke. Symptoms began last evening, progressively worsening.  EXAM: MRI HEAD WITHOUT CONTRAST  MRA HEAD WITHOUT CONTRAST  TECHNIQUE: Multiplanar, multiecho pulse sequences of the brain and surrounding structures were obtained without intravenous contrast. Angiographic images of the head were obtained using MRA technique without contrast.  COMPARISON:  CT of the head October 02, 2014 at 2010 hr.  FINDINGS: MRI HEAD FINDINGS  Mild motion degraded examination.  Acute sub cm posterior limb of the internal capsule/basal ganglia lacunar infarct with decreased ADC values. Two punctate foci of susceptibility artifact within the LEFT putaminal. No midline shift or  mass effect. The ventricles and sulci are normal for patient's age. Severe confluent supratentorial and to lesser extent mesial RIGHT cerebellar T2 hyperintensities, greatly advanced for age.  No abnormal extra-axial fluid collections. Mild paranasal sinus mucosal thickening without air-fluid levels. The mastoid air cells appear well-aerated. Ocular globes and orbital contents are nonsuspicious not tailored for evaluation. Mildly expanded fluid signal within the sella. No cerebellar tonsillar ectopia. No suspicious bone marrow signal.  MRA HEAD FINDINGS  Mild motion degraded examination.  Anterior  circulation: Normal flow related enhancement of the included cervical, petrous, cavernous and supra clinoid internal carotid arteries. Patent anterior communicating artery. Normal flow related enhancement of the anterior and middle cerebral arteries, including more distal segments.  Posterior circulation: RIGHT vertebral artery is dominant. Basilar artery is patent, with normal flow related enhancement of the main branch vessels. Normal flow related enhancement of the posterior cerebral arteries.  No large vessel occlusion, hemodynamically significant stenosis, abnormal luminal irregularity, aneurysm within the anterior nor posterior circulation.  IMPRESSION: Mild motion degraded examination.  MRI HEAD: Acute sub cm LEFT basal ganglia/internal capsule lacunar infarct.  Severe white matter changes may reflect chronic small vessel ischemic disease though, superimposed demyelination may have this appearance. Recommend 3 to six-month follow-up MRI of the brain. Contrast could be considered clinically indicated.  Empty sella.  MRA HEAD:  No acute vascular process ; normal MRA of the head.  Electronically Signed: By: Awilda Metro On: 10/03/2014 00:30     PHYSICAL EXAM Obese young African American lady not in distress.Awake alert. Afebrile. Head is nontraumatic. Neck is supple without bruit. Hearing is normal. Cardiac exam no murmur or gallop. Lungs are clear to auscultation. Distal pulses are well felt. Neurological Exam : Awake alert oriented x 3 normal speech and language. Mild right lower face asymmetry. Tongue midline. No drift. Mild diminished fine finger movements on right. Orbits left over right upper extremity. Mild right grip weak.. Normal sensation . Normal coordination. ASSESSMENT/PLAN Andrea Clements is a 48 y.o. female with history of hypertension and obesity presenting with weakness and numbness of  right hand. She did not receive IV t-PA due to delay in arrival.   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 Vertebral arteries are patent with antegrade flow.   2D Echo  Left ventricle: The cavity size was normal. There was mild concentric hypertrophy. Systolic function was moderately to severely reduced. The estimated ejection fraction was in the range of 30% to 35%. There is akinesis of the inferolateral, inferior, and inferoseptal myocardium  HgbA1c 5.9%  Lovenox 30 mg sq daily for VTE prophylaxis  Diet Heart thin liquids  no antithrombotics prior to admission, placed on clopidogrel 75 mg orally every day given aspirin allergy  Patient counseled to be compliant with her antithrombotic medications  Ongoing aggressive stroke risk factor management  Therapy recommendations:  No OT  Disposition:  Anticipate d/c home  Hypertension  Home meds:   Norvasc, lozol, toprol  Stable  Patient counseled to be compliant with her blood pressure medications  Hyperlipidemia  Home meds:  No statin  LDL 127, goal < 70  Added statin - zocor 20 mg daily  Continue statin at discharge  Other Stroke Risk Factors  Cigarette smoker, advised to stop smoking  Morbid Obesity, Body mass index  is 49.91 kg/(m^2).   Has had obstructive sleep apnea testing, mask not recommended per pt  Other Active Problems  Acute kidney injury  asthma  Hospital day # 2  Annie Main, MSN, APRN, ANVP-BC, AGPCNP-BC [facility] Pager: [phone] 10/04/2014 1:21 PM  I have personally examined this patient, reviewed notes, independently viewed imaging studies, participated in medical decision making and plan of care. I have made any additions or clarifications directly to the above note. Agree with note above. She has small left basal ganglia infarct secondary to small  vessel disease. Stroke workup is completed and patient can be discharged home and f/u in stroke clinic in 4 weeks. She may be referred to cardiology as outpatient to evaluate low ejection fraction  Delia Heady, MD Medical Director [facility] Pager: [phone] 10/04/2014 1:21 PM    To contact Stroke Continuity provider, please refer to [email or web address]. After hours, contact General Neurology

## 2014-10-04 NOTE — Plan of Care (Signed)
Problem: Progression Outcomes Goal: Progressive activity as tolerated Outcome: Completed/Met Date Met:  10/04/14 Goal: Tolerating diet/TF at goal rate Outcome: Completed/Met Date Met:  10/04/14 Goal: Bowel & Bladder Continence Outcome: Completed/Met Date Met:  10/04/14 Goal: Educational plan initiated Outcome: Completed/Met Date Met:  10/04/14 Goal: Initial discharge plan initiated Outcome: Completed/Met Date Met:  10/04/14

## 2014-10-04 NOTE — Progress Notes (Signed)
Patient had Medicaid but it lapsed and now patient does not have any insurance coverage or PCP; patient is agreeable to go to the [facility] and [facility] for follow up medical care; Apt made for Oct 10, 2014 at 9am - follow up visit and Eligibility for financial counseling Oct 27, 2014 at 2:30 pm; at discharge patient can get her prescriptions filled there also; Alexis Goodell [phone]

## 2014-10-10 ENCOUNTER — Encounter: Payer: Self-pay | Admitting: Internal Medicine

## 2014-10-10 ENCOUNTER — Ambulatory Visit: Payer: Medicaid Other | Attending: Internal Medicine | Admitting: Internal Medicine

## 2014-10-10 VITALS — BP 154/94 | HR 85 | Temp 98.1°F | Resp 15 | Ht 61.5 in | Wt 274.8 lb

## 2014-10-10 DIAGNOSIS — I6389 Other cerebral infarction: Secondary | ICD-10-CM

## 2014-10-10 DIAGNOSIS — I43 Cardiomyopathy in diseases classified elsewhere: Secondary | ICD-10-CM | POA: Diagnosis not present

## 2014-10-10 DIAGNOSIS — E669 Obesity, unspecified: Secondary | ICD-10-CM | POA: Diagnosis not present

## 2014-10-10 DIAGNOSIS — Z886 Allergy status to analgesic agent status: Secondary | ICD-10-CM | POA: Diagnosis not present

## 2014-10-10 DIAGNOSIS — I639 Cerebral infarction, unspecified: Secondary | ICD-10-CM | POA: Diagnosis present

## 2014-10-10 DIAGNOSIS — E785 Hyperlipidemia, unspecified: Secondary | ICD-10-CM | POA: Insufficient documentation

## 2014-10-10 DIAGNOSIS — R7309 Other abnormal glucose: Secondary | ICD-10-CM | POA: Insufficient documentation

## 2014-10-10 DIAGNOSIS — Z23 Encounter for immunization: Secondary | ICD-10-CM | POA: Diagnosis not present

## 2014-10-10 DIAGNOSIS — N179 Acute kidney failure, unspecified: Secondary | ICD-10-CM | POA: Diagnosis not present

## 2014-10-10 DIAGNOSIS — F17200 Nicotine dependence, unspecified, uncomplicated: Secondary | ICD-10-CM

## 2014-10-10 DIAGNOSIS — Z720 Tobacco use: Secondary | ICD-10-CM

## 2014-10-10 DIAGNOSIS — J4520 Mild intermittent asthma, uncomplicated: Secondary | ICD-10-CM | POA: Insufficient documentation

## 2014-10-10 DIAGNOSIS — I119 Hypertensive heart disease without heart failure: Secondary | ICD-10-CM | POA: Diagnosis not present

## 2014-10-10 DIAGNOSIS — F17210 Nicotine dependence, cigarettes, uncomplicated: Secondary | ICD-10-CM | POA: Insufficient documentation

## 2014-10-10 DIAGNOSIS — Z6841 Body Mass Index (BMI) 40.0 and over, adult: Secondary | ICD-10-CM | POA: Insufficient documentation

## 2014-10-10 DIAGNOSIS — R059 Cough, unspecified: Secondary | ICD-10-CM

## 2014-10-10 DIAGNOSIS — R05 Cough: Secondary | ICD-10-CM

## 2014-10-10 DIAGNOSIS — R7303 Prediabetes: Secondary | ICD-10-CM

## 2014-10-10 DIAGNOSIS — I638 Other cerebral infarction: Secondary | ICD-10-CM

## 2014-10-10 DIAGNOSIS — I429 Cardiomyopathy, unspecified: Secondary | ICD-10-CM

## 2014-10-10 DIAGNOSIS — I10 Essential (primary) hypertension: Secondary | ICD-10-CM

## 2014-10-10 MED ORDER — ALBUTEROL SULFATE (2.5 MG/3ML) 0.083% IN NEBU: 5.0000 mg | INHALATION_SOLUTION | Freq: Four times a day (QID) | RESPIRATORY_TRACT | Status: DC | PRN

## 2014-10-10 MED ORDER — AMLODIPINE BESYLATE 5 MG PO TABS: 5.0000 mg | ORAL_TABLET | Freq: Every day | ORAL | Status: DC

## 2014-10-10 MED ORDER — CLOPIDOGREL BISULFATE 75 MG PO TABS: 75.0000 mg | ORAL_TABLET | Freq: Every day | ORAL | Status: DC

## 2014-10-10 MED ORDER — INDAPAMIDE 1.25 MG PO TABS: 1.2500 mg | ORAL_TABLET | Freq: Every day | ORAL | Status: DC

## 2014-10-10 MED ORDER — BENZONATATE 100 MG PO CAPS: 100.0000 mg | ORAL_CAPSULE | Freq: Three times a day (TID) | ORAL | Status: DC | PRN

## 2014-10-10 MED ORDER — METOPROLOL SUCCINATE ER 100 MG PO TB24: 100.0000 mg | ORAL_TABLET | Freq: Two times a day (BID) | ORAL | Status: DC

## 2014-10-10 MED ORDER — SIMVASTATIN 20 MG PO TABS: 20.0000 mg | ORAL_TABLET | Freq: Every day | ORAL | Status: DC

## 2014-10-10 MED ORDER — ALBUTEROL SULFATE HFA 108 (90 BASE) MCG/ACT IN AERS: 2.0000 | INHALATION_SPRAY | Freq: Four times a day (QID) | RESPIRATORY_TRACT | Status: AC | PRN

## 2014-10-10 NOTE — Patient Instructions (Signed)
Diabetes Mellitus and Food It is important for you to manage your blood sugar (glucose) level. Your blood glucose level can be greatly affected by what you eat. Eating healthier foods in the appropriate amounts throughout the day at about the same time each day will help you control your blood glucose level. It can also help slow or prevent worsening of your diabetes mellitus. Healthy eating may even help you improve the level of your blood pressure and reach or maintain a healthy weight.  HOW CAN FOOD AFFECT ME? Carbohydrates Carbohydrates affect your blood glucose level more than any other type of food. Your dietitian will help you determine how many carbohydrates to eat at each meal and teach you how to count carbohydrates. Counting carbohydrates is important to keep your blood glucose at a healthy level, especially if you are using insulin or taking certain medicines for diabetes mellitus. Alcohol Alcohol can cause sudden decreases in blood glucose (hypoglycemia), especially if you use insulin or take certain medicines for diabetes mellitus. Hypoglycemia can be a life-threatening condition. Symptoms of hypoglycemia (sleepiness, dizziness, and disorientation) are similar to symptoms of having too much alcohol.  If your health care provider has given you approval to drink alcohol, do so in moderation and use the following guidelines:  Women should not have more than one drink per day, and men should not have more than two drinks per day. One drink is equal to:  12 oz of beer.  5 oz of wine.  1 oz of hard liquor.  Do not drink on an empty stomach.  Keep yourself hydrated. Have water, diet soda, or unsweetened iced tea.  Regular soda, juice, and other mixers might contain a lot of carbohydrates and should be counted. WHAT FOODS ARE NOT RECOMMENDED? As you make food choices, it is important to remember that all foods are not the same. Some foods have fewer nutrients per serving than other  foods, even though they might have the same number of calories or carbohydrates. It is difficult to get your body what it needs when you eat foods with fewer nutrients. Examples of foods that you should avoid that are high in calories and carbohydrates but low in nutrients include:  Trans fats (most processed foods list trans fats on the Nutrition Facts label).  Regular soda.  Juice.  Candy.  Sweets, such as cake, pie, doughnuts, and cookies.  Fried foods. WHAT FOODS CAN I EAT? Have nutrient-rich foods, which will nourish your body and keep you healthy. The food you should eat also will depend on several factors, including:  The calories you need.  The medicines you take.  Your weight.  Your blood glucose level.  Your blood pressure level.  Your cholesterol level. You also should eat a variety of foods, including:  Protein, such as meat, poultry, fish, tofu, nuts, and seeds (lean animal proteins are best).  Fruits.  Vegetables.  Dairy products, such as milk, cheese, and yogurt (low fat is best).  Breads, grains, pasta, cereal, rice, and beans.  Fats such as olive oil, trans fat-free margarine, canola oil, avocado, and olives. DOES EVERYONE WITH DIABETES MELLITUS HAVE THE SAME MEAL PLAN? Because every person with diabetes mellitus is different, there is not one meal plan that works for everyone. It is very important that you meet with a dietitian who will help you create a meal plan that is just right for you. Document Released: 07/24/2005 Document Revised: 11/01/2013 Document Reviewed: 09/23/2013 ExitCare Patient Information 2015 [employer]. This   information is not intended to replace advice given to you by your health care provider. Make sure you discuss any questions you have with your health care provider. DASH Eating Plan DASH stands for "Dietary Approaches to Stop Hypertension." The DASH eating plan is a healthy eating plan that has been shown to reduce high  blood pressure (hypertension). Additional health benefits may include reducing the risk of type 2 diabetes mellitus, heart disease, and stroke. The DASH eating plan may also help with weight loss. WHAT DO I NEED TO KNOW ABOUT THE DASH EATING PLAN? For the DASH eating plan, you will follow these general guidelines:  Choose foods with a percent daily value for sodium of less than 5% (as listed on the food label).  Use salt-free seasonings or herbs instead of table salt or sea salt.  Check with your health care provider or pharmacist before using salt substitutes.  Eat lower-sodium products, often labeled as "lower sodium" or "no salt added."  Eat fresh foods.  Eat more vegetables, fruits, and low-fat dairy products.  Choose whole grains. Look for the word "whole" as the first word in the ingredient list.  Choose fish and skinless chicken or turkey more often than red meat. Limit fish, poultry, and meat to 6 oz (170 g) each day.  Limit sweets, desserts, sugars, and sugary drinks.  Choose heart-healthy fats.  Limit cheese to 1 oz (28 g) per day.  Eat more home-cooked food and less restaurant, buffet, and fast food.  Limit fried foods.  Cook foods using methods other than frying.  Limit canned vegetables. If you do use them, rinse them well to decrease the sodium.  When eating at a restaurant, ask that your food be prepared with less salt, or no salt if possible. WHAT FOODS CAN I EAT? Seek help from a dietitian for individual calorie needs. Grains Whole grain or whole wheat bread. Brown rice. Whole grain or whole wheat pasta. Quinoa, bulgur, and whole grain cereals. Low-sodium cereals. Corn or whole wheat flour tortillas. Whole grain cornbread. Whole grain crackers. Low-sodium crackers. Vegetables Fresh or frozen vegetables (raw, steamed, roasted, or grilled). Low-sodium or reduced-sodium tomato and vegetable juices. Low-sodium or reduced-sodium tomato sauce and paste. Low-sodium  or reduced-sodium canned vegetables.  Fruits All fresh, canned (in natural juice), or frozen fruits. Meat and Other Protein Products Ground beef (85% or leaner), grass-fed beef, or beef trimmed of fat. Skinless chicken or turkey. Ground chicken or turkey. Pork trimmed of fat. All fish and seafood. Eggs. Dried beans, peas, or lentils. Unsalted nuts and seeds. Unsalted canned beans. Dairy Low-fat dairy products, such as skim or 1% milk, 2% or reduced-fat cheeses, low-fat ricotta or cottage cheese, or plain low-fat yogurt. Low-sodium or reduced-sodium cheeses. Fats and Oils Tub margarines without trans fats. Light or reduced-fat mayonnaise and salad dressings (reduced sodium). Avocado. Safflower, olive, or canola oils. Natural peanut or almond butter. Other Unsalted popcorn and pretzels. The items listed above may not be a complete list of recommended foods or beverages. Contact your dietitian for more options. WHAT FOODS ARE NOT RECOMMENDED? Grains White bread. White pasta. White rice. Refined cornbread. Bagels and croissants. Crackers that contain trans fat. Vegetables Creamed or fried vegetables. Vegetables in a cheese sauce. Regular canned vegetables. Regular canned tomato sauce and paste. Regular tomato and vegetable juices. Fruits Dried fruits. Canned fruit in light or heavy syrup. Fruit juice. Meat and Other Protein Products Fatty cuts of meat. Ribs, chicken wings, bacon, sausage, bologna, salami, chitterlings, fatback, hot   dogs, bratwurst, and packaged luncheon meats. Salted nuts and seeds. Canned beans with salt. Dairy Whole or 2% milk, cream, half-and-half, and cream cheese. Whole-fat or sweetened yogurt. Full-fat cheeses or blue cheese. Nondairy creamers and whipped toppings. Processed cheese, cheese spreads, or cheese curds. Condiments Onion and garlic salt, seasoned salt, table salt, and sea salt. Canned and packaged gravies. Worcestershire sauce. Tartar sauce. Barbecue sauce.  Teriyaki sauce. Soy sauce, including reduced sodium. Steak sauce. Fish sauce. Oyster sauce. Cocktail sauce. Horseradish. Ketchup and mustard. Meat flavorings and tenderizers. Bouillon cubes. Hot sauce. Tabasco sauce. Marinades. Taco seasonings. Relishes. Fats and Oils Butter, stick margarine, lard, shortening, ghee, and bacon fat. Coconut, palm kernel, or palm oils. Regular salad dressings. Other Pickles and olives. Salted popcorn and pretzels. The items listed above may not be a complete list of foods and beverages to avoid. Contact your dietitian for more information. WHERE CAN I FIND MORE INFORMATION? National Heart, Lung, and [employer] Institute: www.nhlbi.nih.gov/health/health-topics/topics/dash/ Document Released: 10/16/2011 Document Revised: 03/13/2014 Document Reviewed: 08/31/2013 ExitCare Patient Information 2015 [employer]. This information is not intended to replace advice given to you by your health care provider. Make sure you discuss any questions you have with your health care provider.  

## 2014-10-10 NOTE — Progress Notes (Signed)
Patient Demographics  Andrea Clements, is a 48 y.o. female  FBP:[id number]  OEU:[id number]  DOB - Jun 05, 1966  CC:  Chief Complaint  Patient presents with  . Hospitalization Follow-up       HPI: Andrea Clements is a 48 y.o. female here today to establish medical care.Patient has History of hypertension, tobacco abuse, asthma recently hospitalized with symptoms of right hand and right foot weakness, EMR reviewed her patient had CT scan of head done which showed her white matter lesion seen frontal lobe and probably small vessel ischemic disease there was recency of the left frontal lobe which could represent subacute infarct she had stroke workup done subsequently MRI of the brain showed acute subcentimeter left basal ganglia/internal capsule lacunar infarct and chronic small vessel ischemic disease MRA was unremarkable 2-D echo showed EF of 30 to her 35% with grade 2 diastolic dysfunction she had a lipid panel done and her LDL was 127 she was started on statins patient is allergic to aspirin and was started on Plavix her hemoglobin A1c was 5.9% hence she has prediabetes, her blood work also showed low potassium and renal insufficiency therefore patient was not started on ACE inhibitors, for hypertension she was continued with Toprol Norvasc and indapamide, since she has history of asthma she was placed on albuterol inhaler, today she still complains of nonproductive cough, denies any wheezing or shortness of breath, she was prescribed nasal spray which she has not used yet. Patient is requesting refill on her medications. Patient was also advised to follow with her cardiology as well as neurology. Patient has No headache, No chest pain, No abdominal pain - No Nausea, No new weakness tingling or numbness, No Cough - SOB.  Allergies  Allergen Reactions  . Altace [Ramipril] Anaphylaxis  . Aspirin Anaphylaxis   Past Medical History  Diagnosis Date  . Hypertension   . Asthma   .  Cardiomyopathy due to hypertension, without heart failure 10/04/2014   Current Outpatient Prescriptions on File Prior to Visit  Medication Sig Dispense Refill  . HYDROcodone-acetaminophen (NORCO/VICODIN) 5-325 MG per tablet Take 0.5 tablets by mouth at bedtime as needed (cough). (Patient not taking: Reported on 10/10/2014) 6 tablet 0   No current facility-administered medications on file prior to visit.   Family History  Problem Relation Age of Onset  . Hypertension Mother   . Cancer Mother   . Hypertension Father   . Hypertension Maternal Grandmother   . Hypertension Maternal Grandfather   . Hypertension Paternal Grandmother   . Hypertension Paternal Grandfather    History   Social History  . Marital Status: Single    Spouse Name: N/A    Number of Children: N/A  . Years of Education: N/A   Occupational History  . Not on file.   Social History Main Topics  . Smoking status: Current Every Day Smoker -- 0.50 packs/day for 30 years    Types: Cigarettes  . Smokeless tobacco: Not on file  . Alcohol Use: No     Comment: occ  . Drug Use: No  . Sexual Activity: Not on file   Other Topics Concern  . Not on file   Social History Narrative    Review of Systems: Constitutional: Negative for fever, chills, diaphoresis, activity change, appetite change and fatigue. HENT: Negative for ear pain, nosebleeds, congestion, facial swelling, rhinorrhea, neck pain, neck stiffness and ear discharge.  Eyes: Negative for pain, discharge, redness, itching and visual disturbance. Respiratory: Negative for cough, choking, chest  tightness, shortness of breath, wheezing and stridor.  Cardiovascular: Negative for chest pain, palpitations and leg swelling. Gastrointestinal: Negative for abdominal distention. Genitourinary: Negative for dysuria, urgency, frequency, hematuria, flank pain, decreased urine volume, difficulty urinating and dyspareunia.  Musculoskeletal: Negative for back pain, joint  swelling, arthralgia and gait problem. Neurological: Negative for dizziness, tremors, seizures, syncope, facial asymmetry, speech difficulty, weakness, light-headedness, numbness and headaches.  Hematological: Negative for adenopathy. Does not bruise/bleed easily. Psychiatric/Behavioral: Negative for hallucinations, behavioral problems, confusion, dysphoric mood, decreased concentration and agitation.    Objective:   Filed Vitals:   10/10/14 0921  BP: 154/94  Pulse: 85  Temp: 98.1 F (36.7 C)  Resp: 15    Physical Exam: Constitutional: Obese female sitting comfortably not in acute distress. HENT: Normocephalic, atraumatic, External right and left ear normal. Oropharynx is clear and moist. Nasal congestion no sinus tenderness  Eyes: Conjunctivae and EOM are normal. PERRLA, no scleral icterus. Neck: Normal ROM. Neck supple. No JVD. No tracheal deviation. No thyromegaly. CVS: RRR, S1/S2 +, no murmurs, no gallops, no carotid bruit.  Pulmonary: Effort and breath sounds normal, no stridor, rhonchi, wheezes, rales.  Abdominal: Soft. BS +, no distension, tenderness, rebound or guarding.  Musculoskeletal: Normal range of motion. No edema and no tenderness.  Neuro: Alert. Normal reflexes, muscle tone coordination. No cranial nerve deficit. Skin: Skin is warm and dry. No rash noted. Not diaphoretic. No erythema. No pallor. Psychiatric: Normal mood and affect. Behavior, judgment, thought content normal.  Lab Results  Component Value Date   WBC 5.2 10/02/2014   HGB 14.3 10/02/2014   HCT 43.1 10/02/2014   MCV 88.1 10/02/2014   PLT 225 10/02/2014   Lab Results  Component Value Date   CREATININE 1.51* 10/03/2014   BUN 16 10/03/2014   NA 140 10/03/2014   K 3.4* 10/03/2014   CL 101 10/03/2014   CO2 25 10/03/2014    Lab Results  Component Value Date   HGBA1C 5.9* 10/03/2014   Lipid Panel     Component Value Date/Time   CHOL 198 10/03/2014 0730   TRIG 180* 10/03/2014 0730   HDL  35* 10/03/2014 0730   CHOLHDL 5.7 10/03/2014 0730   VLDL 36 10/03/2014 0730   LDLCALC 127* 10/03/2014 0730       Assessment and plan:   1. 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 - clopidogrel (PLAVIX) 75 MG tablet; Take 1 tablet (75 mg total) by mouth daily with breakfast.  Dispense: 30 tablet; Refill: 5 - simvastatin (ZOCOR) 20 MG tablet; Take 1 tablet (20 mg total) by mouth at bedtime.  Dispense: 30 tablet; Refill: 4  2. Essential hypertension Blood pressure is borderline elevated , she has not taken her blood pressure medication today, patient is given refill on medications also advise for DASH diet. - amLODipine (NORVASC) 5 MG tablet; Take 1 tablet (5 mg total) by mouth daily.  Dispense: 30 tablet; Refill: 4 - indapamide (LOZOL) 1.25 MG tablet; Take 1 tablet (1.25 mg total) by mouth daily.  Dispense: 30 tablet; Refill: 3 - metoprolol succinate (TOPROL-XL) 100 MG 24 hr tablet; Take 1 tablet (100 mg total) by mouth 2 (two) times daily. Take with or immediately following a meal.  Dispense: 60 tablet; Refill: 3  3. Cardiomyopathy due to hypertension, without heart failure Patient is also scheduled to follow with cardiology. - metoprolol succinate (TOPROL-XL) 100 MG 24 hr tablet; Take 1 tablet (100 mg total) by  mouth 2 (two) times daily. Take with or immediately following a meal.  Dispense: 60 tablet; Refill: 3  4. AKI (acute kidney injury) Will repeat blood chemistry.  5. Asthma, intermittent, uncomplicated  - albuterol (PROVENTIL HFA;VENTOLIN HFA) 108 (90 BASE) MCG/ACT inhaler; Inhale 2 puffs into the lungs every 6 (six) hours as needed for wheezing or shortness of breath.  Dispense: 1 Inhaler; Refill: 1 - albuterol (PROVENTIL) (2.5 MG/3ML) 0.083% nebulizer solution; Take 6 mLs (5 mg total) by nebulization every 6 (six) hours as needed for wheezing.  Dispense: 75 mL; Refill: 12  6.  Prediabetes Patient is advised for low carbohydrate diet.  7. Smoking Advise patient to quit smoking.  8. Need for prophylactic vaccination against Streptococcus pneumoniae (pneumococcus) Pneumovax given today.  9. Cough  - benzonatate (TESSALON) 100 MG capsule; Take 1 capsule (100 mg total) by mouth 3 (three) times daily as needed for cough.  Dispense: 30 capsule; Refill: 1  10. Hyperlipidemia Patient was recently started on statins, will repeat fasting lipid panel on the next visit. - simvastatin (ZOCOR) 20 MG tablet; Take 1 tablet (20 mg total) by mouth at bedtime.  Dispense: 30 tablet; Refill: 4        Health Maintenance  -Mammogram: uptodate  -Vaccinations:  Pneumovax today   Return in about 3 months (around 01/09/2015) for hypertension.    The patient was given clear instructions to go to ER or return to medical center if symptoms don't improve, worsen or new problems develop. The patient verbalized understanding. The patient was told to call to get lab results if they haven't heard anything in the next week.     Doris Cheadle, MD

## 2014-10-10 NOTE — Progress Notes (Signed)
Hospital follow up.   Patient c/o cough x 1 week that is not getting better.  She has used home remedies that has helped a little. She is requesting refills on all medications.

## 2014-10-11 LAB — COMPLETE METABOLIC PANEL WITH GFR
ALBUMIN: 4 g/dL (ref 3.5–5.2)
ALT: 30 U/L (ref 0–35)
AST: 22 U/L (ref 0–37)
Alkaline Phosphatase: 82 U/L (ref 39–117)
BUN: 22 mg/dL (ref 6–23)
CHLORIDE: 100 meq/L (ref 96–112)
CO2: 31 mEq/L (ref 19–32)
CREATININE: 1.85 mg/dL — AB (ref 0.50–1.10)
Calcium: 9.7 mg/dL (ref 8.4–10.5)
GFR, Est African American: 37 mL/min — ABNORMAL LOW
GFR, Est Non African American: 32 mL/min — ABNORMAL LOW
Glucose, Bld: 99 mg/dL (ref 70–99)
POTASSIUM: 4 meq/L (ref 3.5–5.3)
Sodium: 142 mEq/L (ref 135–145)
Total Bilirubin: 0.4 mg/dL (ref 0.2–1.2)
Total Protein: 7.8 g/dL (ref 6.0–8.3)

## 2014-10-11 LAB — VITAMIN D 25 HYDROXY (VIT D DEFICIENCY, FRACTURES): VIT D 25 HYDROXY: 10 ng/mL — AB (ref 30–100)

## 2014-10-11 LAB — TSH: TSH: 4.139 u[IU]/mL (ref 0.350–4.500)

## 2014-10-13 ENCOUNTER — Telehealth: Payer: Self-pay

## 2014-10-13 DIAGNOSIS — R799 Abnormal finding of blood chemistry, unspecified: Secondary | ICD-10-CM

## 2014-10-13 MED ORDER — VITAMIN D (ERGOCALCIFEROL) 1.25 MG (50000 UNIT) PO CAPS: 50000.0000 [IU] | ORAL_CAPSULE | ORAL | Status: DC

## 2014-10-13 NOTE — Telephone Encounter (Signed)
-----   Message from Doris Cheadle, MD sent at 10/11/2014  1:04 PM EST ----- Blood work reviewed, noticed low vitamin D, call patient advise to start ergocalciferol 50,000 units once a week for the duration of  12 weeks. Also noticed worsening renal function,patient needs further evaluation by nephrology, put in the referral.

## 2014-10-20 ENCOUNTER — Ambulatory Visit (INDEPENDENT_AMBULATORY_CARE_PROVIDER_SITE_OTHER): Payer: Medicaid Other | Admitting: Nurse Practitioner

## 2014-10-20 ENCOUNTER — Encounter: Payer: Self-pay | Admitting: Nurse Practitioner

## 2014-10-20 VITALS — BP 145/90 | HR 76 | Ht 61.0 in | Wt 273.0 lb

## 2014-10-20 DIAGNOSIS — I10 Essential (primary) hypertension: Secondary | ICD-10-CM

## 2014-10-20 DIAGNOSIS — I5022 Chronic systolic (congestive) heart failure: Secondary | ICD-10-CM

## 2014-10-20 DIAGNOSIS — R9430 Abnormal result of cardiovascular function study, unspecified: Secondary | ICD-10-CM

## 2014-10-20 DIAGNOSIS — N183 Chronic kidney disease, stage 3 unspecified: Secondary | ICD-10-CM | POA: Insufficient documentation

## 2014-10-20 DIAGNOSIS — I429 Cardiomyopathy, unspecified: Secondary | ICD-10-CM

## 2014-10-20 DIAGNOSIS — Z720 Tobacco use: Secondary | ICD-10-CM

## 2014-10-20 MED ORDER — HYDRALAZINE HCL 10 MG PO TABS: 10.0000 mg | ORAL_TABLET | Freq: Three times a day (TID) | ORAL | Status: DC

## 2014-10-20 MED ORDER — ISOSORBIDE MONONITRATE ER 30 MG PO TB24: 30.0000 mg | ORAL_TABLET | Freq: Every day | ORAL | Status: DC

## 2014-10-20 NOTE — Progress Notes (Signed)
Patient Name: Andrea Clements Date of Encounter: 10/20/2014  Primary Care Provider:  Doris CheadleADVANI, DEEPAK, MD Primary Cardiologist:  Judie PetitM. Excell Seltzerooper, MD   Patient Profile  48 y/o female with recent L brain CVA and finding of new cardiomyopathy who presents today for hospital f/u.  Problem List   Past Medical History  Diagnosis Date  . Hypertension   . Asthma   . Cardiomyopathy     a. 09/2014 Echo: EF 30-35%, inflat, inf, infsept AK, Gr 2 DD-->No ischemic w/u 2/2 recent CVA and body habitus.  . CVA (cerebral infarction)     a. Acute sub cm LEFT basal ganglia/internal capsule lacunar infarct, normal MRA.  . Morbid obesity   . Tobacco abuse    Past Surgical History  Procedure Laterality Date  . Abdominal hysterectomy    . Cesarean section      Allergies  Allergies  Allergen Reactions  . Altace [Ramipril] Anaphylaxis  . Aspirin Anaphylaxis    HPI  48 y/o female with a h/o HTN, tob abuse, and obesity.  She as recently hospitalized @ [facility] 2/2 right sided wkns and was found to have an acute left basal ganglia/internal capsule lacunar infarct.  Carotid U/S showed non-obs dzs and MRA of the brain was nl. Echo showed EF of 30-35% with inferior wma's.  She was seen by cardiology and placed on toprol xl with recommendation for strict BP control and tobacco cessation.  Unfortunately, due to her body habitus, she was felt to be a poor candidate for non-invasive ischemic testing and 2/2 recent stroke and AKI with creat of 1.75, she was a poor candidate for cath.  Dr. Excell Seltzerooper recommended medical Rx with f/u echo in 3 mos to reassess LV fxn and consider further w/u and EP eval @ that time.  Since discharge, she has continued to smoke about a pack/day, down from 1.5 ppd.  She has been compliant with her meds but does not weigh herself or check her BP.  She denies chest pain, palpitations, dyspnea, pnd, orthopnea, n, v, dizziness, syncope, weight gain, or early satiety.  She thinks amlodipine is giving  her mild malleolar edema.  She is trying to watch her salt intake but says she did cheat last night and went out to eat.  She had f/u labs on 12/1 to reassess her renal fxn and creat returned @ 1.85, up from 1.51 on d/c.  Home Medications  Prior to Admission medications   Medication Sig Start Date End Date Taking? Authorizing Provider  albuterol (PROVENTIL HFA;VENTOLIN HFA) 108 (90 BASE) MCG/ACT inhaler Inhale 2 puffs into the lungs every 6 (six) hours as needed for wheezing or shortness of breath. 10/10/14  Yes Doris Cheadleeepak Advani, MD  clopidogrel (PLAVIX) 75 MG tablet Take 1 tablet (75 mg total) by mouth daily with breakfast. 10/10/14  Yes Deepak Advani, MD  indapamide (LOZOL) 1.25 MG tablet Take 1 tablet (1.25 mg total) by mouth daily. 10/10/14  Yes Doris Cheadleeepak Advani, MD  metoprolol succinate (TOPROL-XL) 100 MG 24 hr tablet Take 1 tablet (100 mg total) by mouth 2 (two) times daily. Take with or immediately following a meal. 10/10/14  Yes Deepak Advani, MD  simvastatin (ZOCOR) 20 MG tablet Take 1 tablet (20 mg total) by mouth at bedtime. 10/10/14  Yes Doris Cheadleeepak Advani, MD  albuterol (PROVENTIL) (2.5 MG/3ML) 0.083% nebulizer solution Take 6 mLs (5 mg total) by nebulization every 6 (six) hours as needed for wheezing. Patient not taking: Reported on 10/20/2014 10/10/14   Doris Cheadleeepak Advani, MD  benzonatate (TESSALON) 100 MG capsule Take 1 capsule (100 mg total) by mouth 3 (three) times daily as needed for cough. Patient not taking: Reported on 10/20/2014 10/10/14   Doris Cheadle, MD  hydrALAZINE (APRESOLINE) 10 MG tablet Take 1 tablet (10 mg total) by mouth 3 (three) times daily. 10/20/14   Ok Anis, NP  HYDROcodone-acetaminophen (NORCO/VICODIN) 5-325 MG per tablet Take 0.5 tablets by mouth at bedtime as needed (cough). Patient not taking: Reported on 10/10/2014 11/02/13   Rodolph Bong, MD  isosorbide mononitrate (IMDUR) 30 MG 24 hr tablet Take 1 tablet (30 mg total) by mouth daily. 10/20/14   Ok Anis,  NP  Vitamin D, Ergocalciferol, (DRISDOL) 50000 UNITS CAPS capsule Take 1 capsule (50,000 Units total) by mouth every 7 (seven) days. Patient not taking: Reported on 10/20/2014 10/13/14   Doris Cheadle, MD    Review of Systems  Overall doing well w/o significant DOE.  She denies chest pain, palpitations, pnd, orthopnea, n, v, dizziness, syncope, edema, weight gain, or early satiety.  All other systems reviewed and are otherwise negative except as noted above.  Physical Exam  Blood pressure 145/90, pulse 76, height 5\' 1"  (1.549 m), weight 273 lb (123.832 kg).  General: Pleasant, NAD Psych: Normal affect. Neuro: Alert and oriented X 3. Moves all extremities spontaneously. HEENT: Normal  Neck: Supple without bruits.  Obese, difficult to assess JVP. Lungs:  Resp regular and unlabored, diminished breath sounds bilat. Heart: RRR, distant.  No s3, s4, or murmurs. Abdomen: Obese, soft, non-tender, non-distended, BS + x 4.  Extremities: No clubbing, cyanosis or edema. DP/PT/Radials 2+ and equal bilaterally.  Accessory Clinical Findings  Lab Results  Component Value Date   CREATININE 1.85* 10/10/2014   BUN 22 10/10/2014   NA 142 10/10/2014   K 4.0 10/10/2014   CL 100 10/10/2014   CO2 31 10/10/2014    Assessment & Plan  1.  Cardiomyopathy/Chronic systolic CHF with an EF of 30-35%:  ? Ischemic vs non-ischemic.  New finding during hospitalization @ time of stroke in November.  She has not been weighing her self @ home but overall has been doing well w/o significant dyspnea, pnd, or orthopnea.  She does not some mild ankle edema, which she thinks may be from norvasc.  She has been compliant with meds.  As above, she is a poor candidate for ischemic eval 2/2 body habitus and CKD III with creat of 1.85 on f/u labs on 12/1.  As previously outlined by Dr. Excell Seltzer, we will continue aggressive medical therapy for LV dysfxn with a plan to f/u echo in 3 mos and reconsider ischemic eval and EP referral @  that point.  To that end, I am going to d/c norvasc and replace it with hydralazine 10mg  tid and imdur 30mg  daily for afterload reduction in a pt that we cannot use an acei/arb/arni/spiro 2/2 CKD.  Cont toprol XL @ current dose.  I've advised that she weigh herself daily and went over rules for reporting weight changes and symptoms.  We also discussed the importance of sodium avoidance in foods.  2.  HTN:  BP elevated today. Cont bb and diuretic.  Changing amlodipine since she thinks it causes her to swell some and replacing it with hydral/nitrate.  She has a cuff @ home and I've asked her to take her pressure daily.  3.  Tob Abuse:  Complete cessation advised.  She plans to stop on her [birth year] - [birth year].  4.  Left sided  lacunar stroke:  No residual symptoms. Cont plavix (allergic to ASA)/statin.  5.  CKD III:  This was initially felt to be AKI while hospitalized but now appears to be chronic in light of recent labs.  Likely 2/2 HTN.  6.  Morbid obesity:  She is trying to exercise more and has been more aware of what she is eating.  7.  Dispo:  F/U echo in 3 mos.  F/U with Dr. Excell Seltzer in 3 mos or sooner if necessary.  Nicolasa Ducking, NP 10/20/2014, 10:42 AM

## 2014-10-20 NOTE — Patient Instructions (Addendum)
STOP AMLODIPINE   START HYDRALAZINE 10 MG THREE TIMES A DAY  START ISOSORBIDE 30 MG DAILY   Your physician has requested that you have an echocardiogram. Echocardiography is a painless test that uses sound waves to create images of your heart. It provides your doctor with information about the size and shape of your heart and how well your heart's chambers and valves are working. This procedure takes approximately one hour. There are no restrictions for this procedure. [place]   Your physician recommends that you schedule a follow-up appointment in: with Dr Excell Seltzerooper about 1 week after ECHO  YOU NEED TO STOP SMOKING   Smoking Cessation Quitting smoking is important to your health and has many advantages. However, it is not always easy to quit since nicotine is a very addictive drug. Oftentimes, people try 3 times or more before being able to quit. This document explains the best ways for you to prepare to quit smoking. Quitting takes hard work and a lot of effort, but you can do it. ADVANTAGES OF QUITTING SMOKING  You will live longer, feel better, and live better.  Your body will feel the impact of quitting smoking almost immediately.  Within 20 minutes, blood pressure decreases. Your pulse returns to its normal level.  After 8 hours, carbon monoxide levels in the blood return to normal. Your oxygen level increases.  After 24 hours, the chance of having a heart attack starts to decrease. Your breath, hair, and body stop smelling like smoke.  After 48 hours, damaged nerve endings begin to recover. Your sense of taste and smell improve.  After 72 hours, the body is virtually free of nicotine. Your bronchial tubes relax and breathing becomes easier.  After 2 to 12 weeks, lungs can hold more air. Exercise becomes easier and circulation improves.  The risk of having a heart attack, stroke, cancer, or lung disease is greatly reduced.  After 1 year, the risk of coronary heart  disease is cut in half.  After 5 years, the risk of stroke falls to the same as a nonsmoker.  After 10 years, the risk of lung cancer is cut in half and the risk of other cancers decreases significantly.  After 15 years, the risk of coronary heart disease drops, usually to the level of a nonsmoker.  If you are pregnant, quitting smoking will improve your chances of having a healthy baby.  The people you live with, especially any children, will be healthier.  You will have extra money to spend on things other than cigarettes. QUESTIONS TO THINK ABOUT BEFORE ATTEMPTING TO QUIT You may want to talk about your answers with your health care provider.  Why do you want to quit?  If you tried to quit in the past, what helped and what did not?  What will be the most difficult situations for you after you quit? How will you plan to handle them?  Who can help you through the tough times? Your family? Friends? A health care provider?  What pleasures do you get from smoking? What ways can you still get pleasure if you quit? Here are some questions to ask your health care provider:  How can you help me to be successful at quitting?  What medicine do you think would be best for me and how should I take it?  What should I do if I need more help?  What is smoking withdrawal like? How can I get information on withdrawal? GET READY  Set  a quit date.  Change your environment by getting rid of all cigarettes, ashtrays, matches, and lighters in your home, car, or work. Do not let people smoke in your home.  Review your past attempts to quit. Think about what worked and what did not. GET SUPPORT AND ENCOURAGEMENT You have a better chance of being successful if you have help. You can get support in many ways.  Tell your family, friends, and coworkers that you are going to quit and need their support. Ask them not to smoke around you.  Get individual, group, or telephone counseling and support.  Programs are available at [employer] and health centers. Call your local health department for information about programs in your area.  Spiritual beliefs and practices may help some smokers quit.  Download a "quit meter" on your computer to keep track of quit statistics, such as how long you have gone without smoking, cigarettes not smoked, and money saved.  Get a self-help book about quitting smoking and staying off tobacco. LEARN NEW SKILLS AND BEHAVIORS  Distract yourself from urges to smoke. Talk to someone, go for a walk, or occupy your time with a task.  Change your normal routine. Take a different route to work. Drink tea instead of coffee. Eat breakfast in a different place.  Reduce your stress. Take a hot bath, exercise, or read a book.  Plan something enjoyable to do every day. Reward yourself for not smoking.  Explore interactive web-based programs that specialize in helping you quit. GET MEDICINE AND USE IT CORRECTLY Medicines can help you stop smoking and decrease the urge to smoke. Combining medicine with the above behavioral methods and support can greatly increase your chances of successfully quitting smoking.  Nicotine replacement therapy helps deliver nicotine to your body without the negative effects and risks of smoking. Nicotine replacement therapy includes nicotine gum, lozenges, inhalers, nasal sprays, and skin patches. Some may be available over-the-counter and others require a prescription.  Antidepressant medicine helps people abstain from smoking, but how this works is unknown. This medicine is available by prescription.  Nicotinic receptor partial agonist medicine simulates the effect of nicotine in your brain. This medicine is available by prescription. Ask your health care provider for advice about which medicines to use and how to use them based on your health history. Your health care provider will tell you what side effects to look out for if you  choose to be on a medicine or therapy. Carefully read the information on the package. Do not use any other product containing nicotine while using a nicotine replacement product.  RELAPSE OR DIFFICULT SITUATIONS Most relapses occur within the first 3 months after quitting. Do not be discouraged if you start smoking again. Remember, most people try several times before finally quitting. You may have symptoms of withdrawal because your body is used to nicotine. You may crave cigarettes, be irritable, feel very hungry, cough often, get headaches, or have difficulty concentrating. The withdrawal symptoms are only temporary. They are strongest when you first quit, but they will go away within 10-14 days. To reduce the chances of relapse, try to:  Avoid drinking alcohol. Drinking lowers your chances of successfully quitting.  Reduce the amount of caffeine you consume. Once you quit smoking, the amount of caffeine in your body increases and can give you symptoms, such as a rapid heartbeat, sweating, and anxiety.  Avoid smokers because they can make you want to smoke.  Do not let weight gain distract you.  Many smokers will gain weight when they quit, usually less than 10 pounds. Eat a healthy diet and stay active. You can always lose the weight gained after you quit.  Find ways to improve your mood other than smoking. FOR MORE INFORMATION  www.smokefree.gov  Document Released: 10/21/2001 Document Revised: 03/13/2014 Document Reviewed: 02/05/2012 [facility] Patient Information 2015 [place], [place]. This information is not intended to replace advice given to you by your health care provider. Make sure you discuss any questions you have with your health care provider.

## 2014-10-27 ENCOUNTER — Ambulatory Visit: Payer: Medicaid Other

## 2014-12-13 ENCOUNTER — Ambulatory Visit: Payer: Self-pay | Admitting: Neurology

## 2014-12-18 ENCOUNTER — Other Ambulatory Visit (HOSPITAL_COMMUNITY): Payer: Medicaid Other

## 2014-12-28 ENCOUNTER — Ambulatory Visit: Payer: Medicaid Other | Admitting: Cardiovascular Disease

## 2015-01-16 ENCOUNTER — Ambulatory Visit (HOSPITAL_COMMUNITY): Payer: Medicaid Other | Attending: Cardiology

## 2015-01-16 DIAGNOSIS — R9430 Abnormal result of cardiovascular function study, unspecified: Secondary | ICD-10-CM | POA: Insufficient documentation

## 2015-01-16 NOTE — Progress Notes (Signed)
2D Echo completed. 01/16/2015 

## 2015-01-17 ENCOUNTER — Telehealth: Payer: Self-pay | Admitting: *Deleted

## 2015-01-17 NOTE — Telephone Encounter (Signed)
Called pt to inform her of echo results. After giving results pt stated that at one of her office visits she had spoke with provider in regards to quitting smoking. Pt states that she has attempted to quit smoking and was unsuccessful. Pt states that provider had spoke of taking a medication to help stop smoking. Asked pt if Chantix was what was mentioned and she wasn't sure. Pt would like to try oral medication to quit smoking. Informed pt that Dr. Excell Seltzer is not in the office at this time but that I would route this information to Dr. Excell Seltzer and his nurse, Leotis Shames, for review, advisement and follow up. Pt verbalized understanding and was in agreement with this plan.  Will route to Dr. Excell Seltzer and Leotis Shames.

## 2015-01-17 NOTE — Telephone Encounter (Signed)
I'm not comfortable with Chantix after a stroke. Can prescribe buproprion 150 mg daily x 3 days then 150 mg BID. [employer] quit day 7 days after starting. Can combine this with nicotine patch.  Tonny Bollman 01/17/2015 10:08 PM

## 2015-01-18 MED ORDER — BUPROPION HCL ER (SR) 150 MG PO TB12: 150.0000 mg | ORAL_TABLET | Freq: Two times a day (BID) | ORAL | Status: DC

## 2015-01-18 NOTE — Telephone Encounter (Signed)
I spoke with the pt and made her aware of medication recommendation.  I advised the pt that for the first 3 days of taking Bupropion she should take one tablet daily and then increase to twice a day.  Dr Excell Seltzer gave 2 additional refills to give the pt a total of 3 months on this program. The pt verbalized understanding of these instructions. The pt will attempt to use a nicotine patch in combination with Bupropion.

## 2015-02-05 ENCOUNTER — Telehealth: Payer: Self-pay | Admitting: Cardiovascular Disease

## 2015-02-05 NOTE — Telephone Encounter (Signed)
New Msg       Pt noticed a bruise on her arm yesterday and woke up this morning with another bruise.   Could this be related to blood thinners?   Please return call.

## 2015-02-05 NOTE — Telephone Encounter (Signed)
I spoke with the pt and she has noticed a few bruises on her arm.  The pt thinks this is coming from her medication. I made the pt aware that easy bruising can occur with plavix. I advised the pt to be careful when moving about and try not to bump into things.  I made her aware that if she continues to notice new bruises that were not caused by trauma then she should contact our office or Neurology for further recommendations. Pt agreed with plan.

## 2015-02-24 ENCOUNTER — Other Ambulatory Visit: Payer: Self-pay | Admitting: Internal Medicine

## 2015-03-07 ENCOUNTER — Ambulatory Visit (INDEPENDENT_AMBULATORY_CARE_PROVIDER_SITE_OTHER): Payer: Medicaid Other | Admitting: Cardiovascular Disease

## 2015-03-07 ENCOUNTER — Encounter: Payer: Self-pay | Admitting: Cardiovascular Disease

## 2015-03-07 VITALS — BP 182/100 | HR 79 | Ht 61.0 in | Wt 271.4 lb

## 2015-03-07 DIAGNOSIS — I10 Essential (primary) hypertension: Secondary | ICD-10-CM

## 2015-03-07 DIAGNOSIS — I429 Cardiomyopathy, unspecified: Secondary | ICD-10-CM | POA: Diagnosis not present

## 2015-03-07 MED ORDER — INDAPAMIDE 2.5 MG PO TABS: 2.5000 mg | ORAL_TABLET | Freq: Every day | ORAL | Status: DC

## 2015-03-07 MED ORDER — AMLODIPINE BESYLATE 10 MG PO TABS: 10.0000 mg | ORAL_TABLET | Freq: Every day | ORAL | Status: DC

## 2015-03-07 NOTE — Progress Notes (Signed)
Cardiology Office Note Date:  03/09/2015   ID:  Andrea Clements, DOB 03/01/1966, MRN [id number]  PCP:  Doris Cheadle, MD  Cardiologist:  Tonny Bollman, MD    Chief Complaint  Patient presents with  . Cardiomyopathy     History of Present Illness: Andrea Clements is a 49 y.o. female who presents for follow-up of cardiomyopathy. The patient presented in 2015 with an acute stroke involving the left basal ganglia. As part of her stroke evaluation, she had an echocardiogram demonstrating inferior wall motion abnormality and LVEF 30-35%. Medical therapy was recommended. A decision was made to treat her conservatively in the setting of kidney disease, obesity, and recent stroke.  She has some residual deficits from the stroke last year. Complains of some problems with right hand grasp and right leg weakness only when she is overly tired. No speech or visual problems.   She admits to dyspnea with exertion. She also has wheezing. She has a longstanding hx of asthma and continues to smoke cigarettes. She is down from 1 ppd to 1/2 ppd. She denies chest pain but describes a pressure when she 'overdoes it.' This is longstanding. She also complains of leg swelling but no orthopnea or PND.   Past Medical History  Diagnosis Date  . Hypertension   . Asthma   . Cardiomyopathy     a. 09/2014 Echo: EF 30-35%, inflat, inf, infsept AK, Gr 2 DD-->No ischemic w/u 2/2 recent CVA and body habitus.  . CVA (cerebral infarction)     a. Acute sub cm LEFT basal ganglia/internal capsule lacunar infarct, normal MRA.  . Morbid obesity   . Tobacco abuse   . CKD (chronic kidney disease), stage III     Past Surgical History  Procedure Laterality Date  . Abdominal hysterectomy    . Cesarean section      Current Outpatient Prescriptions  Medication Sig Dispense Refill  . albuterol (PROVENTIL HFA;VENTOLIN HFA) 108 (90 BASE) MCG/ACT inhaler Inhale 2 puffs into the lungs every 6 (six) hours as needed for  wheezing or shortness of breath. 1 Inhaler 1  . clopidogrel (PLAVIX) 75 MG tablet Take 1 tablet (75 mg total) by mouth daily with breakfast. 30 tablet 5  . hydrALAZINE (APRESOLINE) 10 MG tablet Take 1 tablet (10 mg total) by mouth 3 (three) times daily. 90 tablet 5  . indapamide (LOZOL) 2.5 MG tablet Take 1 tablet (2.5 mg total) by mouth daily. 30 tablet 6  . isosorbide mononitrate (IMDUR) 30 MG 24 hr tablet Take 1 tablet (30 mg total) by mouth daily. 30 tablet 5  . metoprolol succinate (TOPROL-XL) 100 MG 24 hr tablet Take 1 tablet (100 mg total) by mouth 2 (two) times daily. Take with or immediately following a meal. 60 tablet 3  . simvastatin (ZOCOR) 20 MG tablet Take 1 tablet (20 mg total) by mouth at bedtime. 30 tablet 4  . amLODipine (NORVASC) 10 MG tablet Take 1 tablet (10 mg total) by mouth daily. 30 tablet 6   No current facility-administered medications for this visit.    Allergies:   Altace and Aspirin   Social History:  The patient  reports that she has been smoking Cigarettes.  She has a 15 pack-year smoking history. She does not have any smokeless tobacco history on file. She reports that she does not drink alcohol or use illicit drugs.   Family History:  The patient's  family history includes Cancer in her mother; Hypertension in her father, maternal grandfather, maternal  grandmother, mother, paternal grandfather, and paternal grandmother.    ROS:  Please see the history of present illness.  Otherwise, review of systems is positive for exertional dyspnea, back pain, rash, easy bruising, excessive sweating.  All other systems are reviewed and negative.    PHYSICAL EXAM: VS:  BP 182/100 mmHg  Pulse 79  Ht  (1.549 m)  Wt 271 lb 6.4 oz (123.106 kg)  BMI 51.31 kg/m2 , BMI Body mass index is 51.31 kg/(m^2).  Repeat blood pressure on my check is 220/120. GEN: Well nourished, well developed, in no acute distress HEENT: normal Neck: no JVD, no masses. No carotid  bruits Cardiac: RRR without murmur or gallop                Respiratory:  clear to auscultation bilaterally, normal work of breathing GI: soft, nontender, nondistended, + BS MS: no deformity or atrophy Ext: no pretibial edema, pedal pulses 2+= bilaterally Skin: warm and dry, no rash Neuro:  Strength and sensation are intact Psych: euthymic mood, full affect  EKG:  EKG is ordered today. The ekg ordered today shows sinus rhythm with occasional PVCs, heart rate 79 bpm, possible left atrial enlargement, nonspecific T wave abnormality, prolonged QT  Recent Labs: 10/02/2014: Hemoglobin 14.3; Magnesium 2.1; Platelets 225 10/10/2014: ALT 30; TSH 4.139 03/09/2015: BUN 23; Creatinine 1.91*; Potassium 3.3*; Sodium 139   Lipid Panel     Component Value Date/Time   CHOL 198 10/03/2014 0730   TRIG 180* 10/03/2014 0730   HDL 35* 10/03/2014 0730   CHOLHDL 5.7 10/03/2014 0730   VLDL 36 10/03/2014 0730   LDLCALC 127* 10/03/2014 0730      Wt Readings from Last 3 Encounters:  03/07/15 271 lb 6.4 oz (123.106 kg)  10/20/14 273 lb (123.832 kg)  10/10/14 274 lb 12.8 oz (124.648 kg)     Cardiac Studies Reviewed: 2-D echocardiogram: 01/16/2015: Study Conclusions  - Left ventricle: The cavity size was normal. There was moderate concentric hypertrophy. Systolic function was moderately to severely reduced. The estimated ejection fraction was in the range of 30% to 35%. Wall motion was normal; there were no regional wall motion abnormalities. Features are consistent with a pseudonormal left ventricular filling pattern, with concomitant abnormal relaxation and increased filling pressure (grade 2 diastolic dysfunction). Doppler parameters are consistent with elevated ventricular end-diastolic filling pressure. - Aortic valve: Trileaflet; normal thickness leaflets. Transvalvular velocity was within the normal range. There was no stenosis. There was no regurgitation. - Aortic  root: The aortic root was normal in size. - Mitral valve: Mildly thickened leaflets . - Left atrium: The atrium was moderately dilated. - Right ventricle: Systolic function was normal. - Right atrium: The atrium was normal in size. - Tricuspid valve: There was mild regurgitation. - Pulmonic valve: There was no regurgitation. - Pulmonary arteries: Systolic pressure was within the normal range. - Inferior vena cava: The vessel was normal in size. - Pericardium, extracardiac: There was no pericardial effusion.  Impressions:  - There is no significant difference when compared to the study from 10/03/2014.  ASSESSMENT AND PLAN: 1.  Malignant HTN, uncontrolled: BP markedly elevated but no symptoms of hypertensive emergency are present. Recommend add amlodipine 10 mg daily and increase Lozol to 2.5 mg daily. Follow-up in 48 hours because of severely elevated BP.  2. Cardiomyopathy: likely hypertensive. Needs better BP control and repeat echo after 6 months of good BP treatment.  No clinical sx's of CHF at present.  3. CKD, 3. 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   4. Tobacco: complete cessation advised.  5. Stroke: no further sx's, but understands need to get BP down to prevent further issues.    Current medicines are reviewed with the patient today.  The patient does not have concerns regarding medicines.  Labs/ tests ordered today include:   Orders Placed This Encounter  Procedures  . EKG 12-Lead    Disposition:   FU 48 hours for BP recheck  Signed, Tonny Bollman, MD  03/09/2015 11:09 PM    [facility] [address] [place], [place], [place]  [address] Phone: [phone]; Fax: [phone]

## 2015-03-07 NOTE — Patient Instructions (Signed)
Medication Instructions:  Your physician has recommended you make the following change in your medication:  1. INCREASE Lozol to 2.5mg  take one tablet by mouth daily 2. START Amlodipine 10mg  take one tablet by mouth daily  Pick up prescriptions on the way home from office and start today!!!!  Labwork: No new orders.   Testing/Procedures: No new orders.  Follow-Up: Your physician recommends that you schedule a follow-up appointment on: Friday, April 29th in the BP clinic  Your physician recommends that you schedule a follow-up appointment in: 1 MONTH with PA/NP   Any Other Special Instructions Will Be Listed Below (If Applicable).

## 2015-03-09 ENCOUNTER — Ambulatory Visit (INDEPENDENT_AMBULATORY_CARE_PROVIDER_SITE_OTHER): Payer: Medicaid Other | Admitting: Pharmacist

## 2015-03-09 VITALS — BP 156/106

## 2015-03-09 DIAGNOSIS — I10 Essential (primary) hypertension: Secondary | ICD-10-CM

## 2015-03-09 LAB — BASIC METABOLIC PANEL
BUN: 23 mg/dL (ref 6–23)
CHLORIDE: 104 meq/L (ref 96–112)
CO2: 28 meq/L (ref 19–32)
Calcium: 9.4 mg/dL (ref 8.4–10.5)
Creatinine, Ser: 1.91 mg/dL — ABNORMAL HIGH (ref 0.40–1.20)
GFR: 36.32 mL/min — ABNORMAL LOW (ref >60.00)
GLUCOSE: 86 mg/dL (ref 70–99)
Potassium: 3.3 mEq/L — ABNORMAL LOW (ref 3.5–5.1)
Sodium: 139 mEq/L (ref 135–145)

## 2015-03-09 NOTE — Patient Instructions (Signed)
Your blood pressure is better.  Let us know if you have any extra swelling or problems.   Try to take your blood pressure at home each day and write down the results to bring with you.   We will recheck your blood pressure next week.    We are going to check your blood work today with the increase in the medications.   Dr. Doris Cheadle- [facility]- [phone]

## 2015-03-09 NOTE — Progress Notes (Signed)
Cardiology Office Note Date:  03/09/2015   ID:  Andrea Clements, DOB Mar 13, 1966, MRN [id number]  PCP:  Doris Cheadle, MD  Cardiologist:  Tonny Bollman, MD  Chief Complaint  Patient presents with  . Hypertension     History of Present Illness: Andrea Clements is a 49 y.o. female who presents for follow-up of HTN.  She was seen by Dr. Excell Seltzer on 4/27 and her BP in the office was 182/100.   Previous OV BP readings had been above goal but <160/100.  Pt has a PMH significant for CVA, cardiomyopathy, and CKD stage 3.  There was a note to refer her to nephrology in December but pt has never had an appointment.   Her current BP medications include hydralazine 10mg  TID, isosorbide mononitrate 30mg  daily, indapamide 2.5mg  daily and amlodipine 10mg  daily.  Her indapamide was increased an amlodipine added on 4/25.  She has intolerances to clonidine patch and pill (dry mouth, lethargy, and skin irritation) and ACE inhibitors- anaphylaxis.  She did try amlodipine in the past but this was stopped after her Echo showed an EF of 30-35% and pt was having extensive lower extremity edema.   Pt's BP was 172/110 when she first arrived to the clinic.  After sitting for >5 min, this improved to 156/106.  Her only complaint today is a headache.  She has noticed a decrease in lower extremity swelling since Wednesday.   Social history- Smokes 1/2 ppd   Diet- Pt had been eating a lot of deli meat and is working to cut this out.  She would have 2-3 Pepsi/day and has changed to tea and lemonade.  She tries not to use any extra salt to season her food.    Past Medical History  Diagnosis Date  . Hypertension   . Asthma   . Cardiomyopathy     a. 09/2014 Echo: EF 30-35%, inflat, inf, infsept AK, Gr 2 DD-->No ischemic w/u 2/2 recent CVA and body habitus.  . CVA (cerebral infarction)     a. Acute sub cm LEFT basal ganglia/internal capsule lacunar infarct, normal MRA.  . Morbid obesity   . Tobacco abuse   . CKD  (chronic kidney disease), stage III       Current Outpatient Prescriptions  Medication Sig Dispense Refill  . albuterol (PROVENTIL HFA;VENTOLIN HFA) 108 (90 BASE) MCG/ACT inhaler Inhale 2 puffs into the lungs every 6 (six) hours as needed for wheezing or shortness of breath. 1 Inhaler 1  . amLODipine (NORVASC) 10 MG tablet Take 1 tablet (10 mg total) by mouth daily. 30 tablet 6  . clopidogrel (PLAVIX) 75 MG tablet Take 1 tablet (75 mg total) by mouth daily with breakfast. 30 tablet 5  . hydrALAZINE (APRESOLINE) 10 MG tablet Take 1 tablet (10 mg total) by mouth 3 (three) times daily. 90 tablet 5  . indapamide (LOZOL) 2.5 MG tablet Take 1 tablet (2.5 mg total) by mouth daily. 30 tablet 6  . isosorbide mononitrate (IMDUR) 30 MG 24 hr tablet Take 1 tablet (30 mg total) by mouth daily. 30 tablet 5  . metoprolol succinate (TOPROL-XL) 100 MG 24 hr tablet Take 1 tablet (100 mg total) by mouth 2 (two) times daily. Take with or immediately following a meal. 60 tablet 3  . simvastatin (ZOCOR) 20 MG tablet Take 1 tablet (20 mg total) by mouth at bedtime. 30 tablet 4   No current facility-administered medications for this visit.    Allergies:   Altace and Aspirin  ASSESSMENT AND PLAN:  1.  Hypertension- Pt's BP better today than on Wednesday but still above goal of <140/90.  She has only been on the new medications for 2 days so will not make any adjustments at this time.  Her BMET was last checked in December so will check today to evaluate renal function while on diuretic and to get a baseline in case we may be able to use spironolactone in the future. Will not be able to use ACE-I/ARB due to anaphylaxis.  I have concern for use of amlodipine with her history of swelling on  before and decreased EF but will have patient monitor and let us know if she has issues.  HA likely due to elevated BP and caffeine withdrawal.  Educated pt on symptoms to watch for in case of stroke.  Will recheck BP in 1  week.  If still elevated, may consider spironolactone depending on renal function or methyldopa as a centrally acting agent.

## 2015-03-13 ENCOUNTER — Other Ambulatory Visit: Payer: Self-pay

## 2015-03-13 DIAGNOSIS — E876 Hypokalemia: Secondary | ICD-10-CM

## 2015-03-13 MED ORDER — POTASSIUM CHLORIDE CRYS ER 20 MEQ PO TBCR: 20.0000 meq | EXTENDED_RELEASE_TABLET | Freq: Every day | ORAL | Status: DC

## 2015-03-16 ENCOUNTER — Ambulatory Visit (INDEPENDENT_AMBULATORY_CARE_PROVIDER_SITE_OTHER): Payer: Medicaid Other | Admitting: Pharmacist

## 2015-03-16 ENCOUNTER — Other Ambulatory Visit: Payer: Medicaid Other

## 2015-03-16 VITALS — BP 152/92 | Wt 266.2 lb

## 2015-03-16 DIAGNOSIS — I10 Essential (primary) hypertension: Secondary | ICD-10-CM | POA: Diagnosis not present

## 2015-03-16 MED ORDER — SPIRONOLACTONE 25 MG PO TABS: 25.0000 mg | ORAL_TABLET | Freq: Every day | ORAL | Status: DC

## 2015-03-16 NOTE — Patient Instructions (Signed)
Stop potassium.  Start spironolactone.  Recheck your labs and blood pressure next week.

## 2015-03-16 NOTE — Progress Notes (Signed)
Cardiology Office Note Date:  03/16/2015   ID:  Andrea Clements, DOB [birth year], MRN [id number]  PCP:  Doris Cheadle, MD  Cardiologist:  Tonny Bollman, MD  Chief Complaint  Patient presents with  . Hypertension     History of Present Illness: Andrea Clements is a 49 y.o. female who presents for follow-up of HTN.  She was seen by Dr. Excell Seltzer on 4/27 and her BP in the office was 182/100.   Previous OV BP readings had been above goal but <160/100.  Pt has a PMH significant for CVA, cardiomyopathy, and CKD stage 3.  There was a note to refer her to nephrology in December but pt has never had an appointment.   Her current BP medications include hydralazine 10mg  TID, isosorbide mononitrate 30mg  daily, indapamide 2.5mg  daily and amlodipine 10mg  daily.  Her indapamide was increased an amlodipine added on 4/25.  She has intolerances to clonidine patch and pill (dry mouth, lethargy, and skin irritation) and ACE inhibitors- anaphylaxis.  She did try amlodipine in the past but this was stopped after her Echo showed an EF of 30-35% and pt was having extensive lower extremity edema.   Pt reports doing well since last visit.  She has not noticed any side effects from her new medications, including lower extremity edema.  We did check a BMET last week and her K was low so she was started on 20 mEq of potassium a day.  Pt is asking if there is any alternative to the pill as she is having difficulty swallowing this.     BP today in the office better (152/92) but still not at goal.  Pt forgot her BP log but states it is 160s/90s at home.  She has not had any readings in the 200s this week.   BP Readings from Last 3 Encounters:  03/16/15 152/92  03/09/15 156/106  03/07/15 182/100   Filed Weights   03/16/15 1020  Weight: 266 lb 4 oz (120.77 kg)    Social history- Smokes 1/2 ppd   Diet- Pt had been eating a lot of deli meat and is working to cut this out.  She would have 2-3 Pepsi/day and has  changed to tea and lemonade.  She tries not to use any extra salt to season her food.    Past Medical History  Diagnosis Date  . Hypertension   . Asthma   . Cardiomyopathy     a. 09/2014 Echo: EF 30-35%, inflat, inf, infsept AK, Gr 2 DD-->No ischemic w/u 2/2 recent CVA and body habitus.  . CVA (cerebral infarction)     a. Acute sub cm LEFT basal ganglia/internal capsule lacunar infarct, normal MRA.  . Morbid obesity   . Tobacco abuse   . CKD (chronic kidney disease), stage III       Current Outpatient Prescriptions  Medication Sig Dispense Refill  . albuterol (PROVENTIL HFA;VENTOLIN HFA) 108 (90 BASE) MCG/ACT inhaler Inhale 2 puffs into the lungs every 6 (six) hours as needed for wheezing or shortness of breath. 1 Inhaler 1  . amLODipine (NORVASC) 10 MG tablet Take 1 tablet (10 mg total) by mouth daily. 30 tablet 6  . clopidogrel (PLAVIX) 75 MG tablet Take 1 tablet (75 mg total) by mouth daily with breakfast. 30 tablet 5  . hydrALAZINE (APRESOLINE) 10 MG tablet Take 1 tablet (10 mg total) by mouth 3 (three) times daily. 90 tablet 5  . indapamide (LOZOL) 2.5 MG tablet Take 1 tablet (2.5 mg  total) by mouth daily. 30 tablet 6  . isosorbide mononitrate (IMDUR) 30 MG 24 hr tablet Take 1 tablet (30 mg total) by mouth daily. 30 tablet 5  . metoprolol succinate (TOPROL-XL) 100 MG 24 hr tablet Take 1 tablet (100 mg total) by mouth 2 (two) times daily. Take with or immediately following a meal. 60 tablet 3  . potassium chloride SA (K-DUR,KLOR-CON) 20 MEQ tablet Take 1 tablet (20 mEq total) by mouth daily. 30 tablet 3  . simvastatin (ZOCOR) 20 MG tablet Take 1 tablet (20 mg total) by mouth at bedtime. 30 tablet 4  . spironolactone (ALDACTONE) 25 MG tablet Take 1 tablet (25 mg total) by mouth daily. 30 tablet 6   No current facility-administered medications for this visit.    Allergies:   Altace and Aspirin    ASSESSMENT AND PLAN:  1.  Hypertension- Pt's BP better today but still above goal  of <140/90.  Will not be able to use ACE-I/ARB due to anaphylaxis.  I have concern for use of amlodipine long term with her history of swelling on  before and decreased EF but will have patient monitor and let us know if she has issues.  May consider spironolacton or methyldopa.  Would prefer spironolactone as patient also has a reduced EF.  Her SCr was 1.9, but this seems fairly stable over the past year.  Will watch closely.  Given her issues with the potassium tablets, will be able to stop with the addition of spironolactone as this should help increase the potassium as well.  Will follow up with patient next week for BP check and repeat BMET.

## 2015-03-23 ENCOUNTER — Other Ambulatory Visit (INDEPENDENT_AMBULATORY_CARE_PROVIDER_SITE_OTHER): Payer: Medicaid Other | Admitting: *Deleted

## 2015-03-23 ENCOUNTER — Ambulatory Visit (INDEPENDENT_AMBULATORY_CARE_PROVIDER_SITE_OTHER): Payer: Medicaid Other | Admitting: Pharmacist

## 2015-03-23 ENCOUNTER — Other Ambulatory Visit: Payer: Self-pay | Admitting: *Deleted

## 2015-03-23 VITALS — BP 156/106 | Wt 266.0 lb

## 2015-03-23 DIAGNOSIS — I10 Essential (primary) hypertension: Secondary | ICD-10-CM | POA: Diagnosis not present

## 2015-03-23 DIAGNOSIS — E876 Hypokalemia: Secondary | ICD-10-CM

## 2015-03-23 LAB — BASIC METABOLIC PANEL
BUN: 30 mg/dL — ABNORMAL HIGH (ref 6–23)
CO2: 32 mEq/L (ref 19–32)
Calcium: 9.5 mg/dL (ref 8.4–10.5)
Chloride: 99 mEq/L (ref 96–112)
Creatinine, Ser: 2.09 mg/dL — ABNORMAL HIGH (ref 0.40–1.20)
GFR: 32.73 mL/min — ABNORMAL LOW (ref >60.00)
GLUCOSE: 89 mg/dL (ref 70–99)
POTASSIUM: 3.2 meq/L — AB (ref 3.5–5.1)
Sodium: 137 mEq/L (ref 135–145)

## 2015-03-23 NOTE — Patient Instructions (Signed)
Keep checking your blood pressure at home every day and email the results to [facility] or Dr. Excell Seltzer through MyChart.  We will call you with your lab results.

## 2015-03-23 NOTE — Progress Notes (Signed)
Cardiology Office Note Date:  03/26/2015   ID:  Andrea Clements, DOB 1966/07/02, MRN [id number]  PCP:  Doris Cheadle, MD  Cardiologist:  Tonny Bollman, MD  Chief Complaint  Patient presents with  . Hypertension     History of Present Illness: Andrea Clements is a 49 y.o. female who presents for follow-up of HTN.  She was seen by Dr. Excell Seltzer on 4/27 and her BP in the office was 182/100.   Previous OV BP readings had been above goal but <160/100.  Pt has a PMH significant for CVA, cardiomyopathy, and CKD stage 3.  There was a note to refer her to nephrology in December but pt has never had an appointment.   Her current BP medications include hydralazine  TID, isosorbide mononitrate  daily, indapamide 2.5mg  daily, amlodipine  daily, and spironolactone  daily .  Her indapamide was increased and amlodipine added on 4/25.   Her K was low on 4/29 so potassium tablets were added.  She had trouble swallowing these so we stopped that and added spironolactone on 5/6.  She has intolerances to clonidine patch and pill (dry mouth, lethargy, and skin irritation) and ACE inhibitors- anaphylaxis.  She did try amlodipine in the past but this was stopped after her Echo showed an EF of 30-35% and pt was having extensive lower extremity edema.   Pt reports doing better since last visit.  She states she has more energy over the past week.  She is checking her BP at home and is in to 130s systolic.  It was a little higher in the office today.     BP Readings from Last 3 Encounters:  03/23/15 156/106  03/16/15 152/92  03/09/15 156/106   Filed Weights   03/23/15 0951  Weight: 266 lb (120.657 kg)    Social history- Smokes 1/2 ppd   Diet- Pt had been eating a lot of deli meat and is working to cut this out.  She only had 1 soft drink this week.  She tries not to use any extra salt to season her food.    Past Medical History  Diagnosis Date  . Hypertension   . Asthma   .  Cardiomyopathy     a. 09/2014 Echo: EF 30-35%, inflat, inf, infsept AK, Gr 2 DD-->No ischemic w/u 2/2 recent CVA and body habitus.  . CVA (cerebral infarction)     a. Acute sub cm LEFT basal ganglia/internal capsule lacunar infarct, normal MRA.  . Morbid obesity   . Tobacco abuse   . CKD (chronic kidney disease), stage III       Current Outpatient Prescriptions  Medication Sig Dispense Refill  . albuterol (PROVENTIL HFA;VENTOLIN HFA) 108 (90 BASE) MCG/ACT inhaler Inhale 2 puffs into the lungs every 6 (six) hours as needed for wheezing or shortness of breath. 1 Inhaler 1  . amLODipine (NORVASC) 10 MG tablet Take 1 tablet (10 mg total) by mouth daily. 30 tablet 6  . clopidogrel (PLAVIX) 75 MG tablet Take 1 tablet (75 mg total) by mouth daily with breakfast. 30 tablet 5  . hydrALAZINE (APRESOLINE) 10 MG tablet Take 1 tablet (10 mg total) by mouth 3 (three) times daily. 90 tablet 5  . indapamide (LOZOL) 2.5 MG tablet Take 1 tablet (2.5 mg total) by mouth daily. 30 tablet 6  . isosorbide mononitrate (IMDUR) 30 MG 24 hr tablet Take 1 tablet (30 mg total) by mouth daily. 30 tablet 5  . metoprolol succinate (TOPROL-XL) 100 MG 24  hr tablet Take 1 tablet (100 mg total) by mouth 2 (two) times daily. Take with or immediately following a meal. 60 tablet 3  . potassium chloride SA (K-DUR,KLOR-CON) 20 MEQ tablet Take 1 tablet (20 mEq total) by mouth daily. 30 tablet 3  . simvastatin (ZOCOR) 20 MG tablet Take 1 tablet (20 mg total) by mouth at bedtime. 30 tablet 4  . spironolactone (ALDACTONE) 25 MG tablet Take 1 tablet (25 mg total) by mouth daily. 30 tablet 6   No current facility-administered medications for this visit.    Allergies:   Altace and Aspirin    ASSESSMENT AND PLAN:  1.  Hypertension- Pt's BP better at home but still above goal of <140/90 in the office.  Continued to ask her to bring her log or machine from home in with her so we can monitor.  She is doing well with spironolactone but  will recheck BMET today to make sure K better and renal function is stable.  Will follow up after lab results available.

## 2015-03-23 NOTE — Addendum Note (Signed)
Addended by: Tonita Phoenix on: 03/23/2015 09:33 AM   Modules accepted: Orders

## 2015-03-30 ENCOUNTER — Other Ambulatory Visit (INDEPENDENT_AMBULATORY_CARE_PROVIDER_SITE_OTHER): Payer: Medicaid Other | Admitting: *Deleted

## 2015-03-30 DIAGNOSIS — E876 Hypokalemia: Secondary | ICD-10-CM

## 2015-03-30 LAB — BASIC METABOLIC PANEL
BUN: 30 mg/dL — AB (ref 6–23)
CALCIUM: 9.3 mg/dL (ref 8.4–10.5)
CO2: 27 mEq/L (ref 19–32)
CREATININE: 2.02 mg/dL — AB (ref 0.40–1.20)
Chloride: 105 mEq/L (ref 96–112)
GFR: 34.04 mL/min — AB (ref >60.00)
Glucose, Bld: 116 mg/dL — ABNORMAL HIGH (ref 70–99)
Potassium: 3.6 mEq/L (ref 3.5–5.1)
Sodium: 139 mEq/L (ref 135–145)

## 2015-04-12 ENCOUNTER — Other Ambulatory Visit: Payer: Medicaid Other

## 2015-04-12 ENCOUNTER — Ambulatory Visit: Payer: Medicaid Other | Admitting: Physician Assistant

## 2015-04-30 ENCOUNTER — Encounter: Payer: Self-pay | Admitting: Cardiovascular Disease

## 2015-04-30 ENCOUNTER — Ambulatory Visit (INDEPENDENT_AMBULATORY_CARE_PROVIDER_SITE_OTHER): Payer: Medicaid Other | Admitting: Cardiovascular Disease

## 2015-04-30 ENCOUNTER — Other Ambulatory Visit (INDEPENDENT_AMBULATORY_CARE_PROVIDER_SITE_OTHER): Payer: Medicaid Other | Admitting: *Deleted

## 2015-04-30 VITALS — BP 132/76 | HR 76 | Ht 61.0 in | Wt 274.0 lb

## 2015-04-30 DIAGNOSIS — E876 Hypokalemia: Secondary | ICD-10-CM

## 2015-04-30 DIAGNOSIS — I119 Hypertensive heart disease without heart failure: Secondary | ICD-10-CM | POA: Diagnosis not present

## 2015-04-30 DIAGNOSIS — I429 Cardiomyopathy, unspecified: Secondary | ICD-10-CM

## 2015-04-30 DIAGNOSIS — I428 Other cardiomyopathies: Secondary | ICD-10-CM

## 2015-04-30 DIAGNOSIS — I10 Essential (primary) hypertension: Secondary | ICD-10-CM

## 2015-04-30 DIAGNOSIS — I43 Cardiomyopathy in diseases classified elsewhere: Secondary | ICD-10-CM

## 2015-04-30 LAB — BASIC METABOLIC PANEL
BUN: 25 mg/dL — AB (ref 6–23)
CALCIUM: 9.1 mg/dL (ref 8.4–10.5)
CHLORIDE: 105 meq/L (ref 96–112)
CO2: 29 mEq/L (ref 19–32)
CREATININE: 2.14 mg/dL — AB (ref 0.40–1.20)
GFR: 31.83 mL/min — ABNORMAL LOW (ref >60.00)
Glucose, Bld: 70 mg/dL (ref 70–99)
Potassium: 3.5 mEq/L (ref 3.5–5.1)
Sodium: 138 mEq/L (ref 135–145)

## 2015-04-30 MED ORDER — METOPROLOL SUCCINATE ER 100 MG PO TB24: 100.0000 mg | ORAL_TABLET | Freq: Two times a day (BID) | ORAL | Status: DC

## 2015-04-30 MED ORDER — SPIRONOLACTONE 25 MG PO TABS: 25.0000 mg | ORAL_TABLET | Freq: Every day | ORAL | Status: DC

## 2015-04-30 MED ORDER — INDAPAMIDE 2.5 MG PO TABS: 2.5000 mg | ORAL_TABLET | Freq: Every day | ORAL | Status: DC

## 2015-04-30 MED ORDER — POTASSIUM CHLORIDE CRYS ER 20 MEQ PO TBCR: 20.0000 meq | EXTENDED_RELEASE_TABLET | Freq: Every day | ORAL | Status: DC

## 2015-04-30 MED ORDER — HYDRALAZINE HCL 10 MG PO TABS: 10.0000 mg | ORAL_TABLET | Freq: Three times a day (TID) | ORAL | Status: DC

## 2015-04-30 MED ORDER — ISOSORBIDE MONONITRATE ER 30 MG PO TB24: 30.0000 mg | ORAL_TABLET | Freq: Every day | ORAL | Status: DC

## 2015-04-30 MED ORDER — AMLODIPINE BESYLATE 10 MG PO TABS: 10.0000 mg | ORAL_TABLET | Freq: Every day | ORAL | Status: DC

## 2015-04-30 NOTE — Progress Notes (Signed)
Cardiology Office Note Date:  04/30/2015   ID:  Andrea Clements, DOB Mar 02, 1966, MRN [id number]  PCP:  Doris Cheadle, MD  Cardiologist:  Tonny Bollman, MD    Chief Complaint  Patient presents with  . Hypertension    History of Present Illness: Andrea Clements is a 49 y.o. female who presents for follow-up of cardiomyopathy. The patient presented in 2015 with an acute stroke involving the left basal ganglia. As part of her stroke evaluation, she had an echocardiogram demonstrating inferior wall motion abnormality and LVEF 30-35%. Medical therapy was recommended. A decision was made to treat her conservatively in the setting of kidney disease, obesity, and recent stroke.   She's had continued difficulty with malignant hypertension and she has been followed closely now in our hypertension clinic. She's had a very good initial response to spironolactone with home BP's in range.   She denies edema, orthopnea, PND, or chest pain. She's compliant with her complex medical regimen. No dizziness or lightheadedness.  Past Medical History  Diagnosis Date  . Hypertension   . Asthma   . Cardiomyopathy     a. 09/2014 Echo: EF 30-35%, inflat, inf, infsept AK, Gr 2 DD-->No ischemic w/u 2/2 recent CVA and body habitus.  . CVA (cerebral infarction)     a. Acute sub cm LEFT basal ganglia/internal capsule lacunar infarct, normal MRA.  . Morbid obesity   . Tobacco abuse   . CKD (chronic kidney disease), stage III     Past Surgical History  Procedure Laterality Date  . Abdominal hysterectomy    . Cesarean section      Current Outpatient Prescriptions  Medication Sig Dispense Refill  . albuterol (PROVENTIL HFA;VENTOLIN HFA) 108 (90 BASE) MCG/ACT inhaler Inhale 2 puffs into the lungs every 6 (six) hours as needed for wheezing or shortness of breath. 1 Inhaler 1  . amLODipine (NORVASC) 10 MG tablet Take 1 tablet (10 mg total) by mouth daily. 90 tablet 3  . clopidogrel (PLAVIX) 75 MG tablet  Take 1 tablet (75 mg total) by mouth daily with breakfast. 30 tablet 5  . hydrALAZINE (APRESOLINE) 10 MG tablet Take 1 tablet (10 mg total) by mouth 3 (three) times daily. 270 tablet 3  . indapamide (LOZOL) 2.5 MG tablet Take 1 tablet (2.5 mg total) by mouth daily. 90 tablet 3  . isosorbide mononitrate (IMDUR) 30 MG 24 hr tablet Take 1 tablet (30 mg total) by mouth daily. 90 tablet 3  . metoprolol succinate (TOPROL-XL) 100 MG 24 hr tablet Take 1 tablet (100 mg total) by mouth 2 (two) times daily. Take with or immediately following a meal. 60 tablet 5  . potassium chloride SA (K-DUR,KLOR-CON) 20 MEQ tablet Take 1 tablet (20 mEq total) by mouth daily. 90 tablet 3  . spironolactone (ALDACTONE) 25 MG tablet Take 1 tablet (25 mg total) by mouth daily. 90 tablet 3  . simvastatin (ZOCOR) 20 MG tablet Take 1 tablet (20 mg total) by mouth at bedtime. (Patient not taking: Reported on 04/30/2015) 30 tablet 4   No current facility-administered medications for this visit.    Allergies:   Altace and Aspirin   Social History:  The patient  reports that she has been smoking Cigarettes.  She has a 15 pack-year smoking history. She does not have any smokeless tobacco history on file. She reports that she does not drink alcohol or use illicit drugs.   Family History:  The patient's  family history includes Cancer in her mother; Hypertension  in her father, maternal grandfather, maternal grandmother, mother, paternal grandfather, and paternal grandmother.    ROS:  Please see the history of present illness.  Otherwise, review of systems is positive for  Muscle pain, rash, easy bruising, constipation.  All other systems are reviewed and negative.    PHYSICAL EXAM: VS:  BP 132/76 mmHg  Pulse 76  Ht  (1.549 m)  Wt 274 lb (124.286 kg)  BMI 51.80 kg/m2 , BMI Body mass index is 51.8 kg/(m^2). GEN: Well nourished, well developed, pleasant obese woman in no acute distress HEENT: normal Neck: no JVD, no masses.  No carotid bruits Cardiac: RRR without murmur or gallop                Respiratory:  clear to auscultation bilaterally, normal work of breathing GI: soft, nontender, nondistended, + BS MS: no deformity or atrophy Ext: no pretibial edema, pedal pulses 2+= bilaterally Skin: warm and dry, no rash Neuro:  Strength and sensation are intact Psych: euthymic mood, full affect  EKG:  EKG is not ordered today.   Recent Labs: 10/02/2014: Hemoglobin 14.3; Magnesium 2.1; Platelets 225 10/10/2014: ALT 30; TSH 4.139 04/30/2015: BUN 25*; Creatinine, Ser 2.14*; Potassium 3.5; Sodium 138   Lipid Panel     Component Value Date/Time   CHOL 198 10/03/2014 0730   TRIG 180* 10/03/2014 0730   HDL 35* 10/03/2014 0730   CHOLHDL 5.7 10/03/2014 0730   VLDL 36 10/03/2014 0730   LDLCALC 127* 10/03/2014 0730      Wt Readings from Last 3 Encounters:  04/30/15 274 lb (124.286 kg)  03/23/15 266 lb (120.657 kg)  03/16/15 266 lb 4 oz (120.77 kg)     Cardiac Studies Reviewed: 2-D echocardiogram: 01/16/2015: Study Conclusions  - Left ventricle: The cavity size was normal. There was moderate concentric hypertrophy. Systolic function was moderately to severely reduced. The estimated ejection fraction was in the range of 30% to 35%. Wall motion was normal; there were no regional wall motion abnormalities. Features are consistent with a pseudonormal left ventricular filling pattern, with concomitant abnormal relaxation and increased filling pressure (grade 2 diastolic dysfunction). Doppler parameters are consistent with elevated ventricular end-diastolic filling pressure. - Aortic valve: Trileaflet; normal thickness leaflets. Transvalvular velocity was within the normal range. There was no stenosis. There was no regurgitation. - Aortic root: The aortic root was normal in size. - Mitral valve: Mildly thickened leaflets . - Left atrium: The atrium was moderately dilated. - Right  ventricle: Systolic function was normal. - Right atrium: The atrium was normal in size. - Tricuspid valve: There was mild regurgitation. - Pulmonic valve: There was no regurgitation. - Pulmonary arteries: Systolic pressure was within the normal range. - Inferior vena cava: The vessel was normal in size. - Pericardium, extracardiac: There was no pericardial effusion.  Impressions:  - There is no significant difference when compared to the study from 10/03/2014.  ASSESSMENT AND PLAN: 1.  Malignant HTN: now much better controlled. Reviewed medical therapy today, sodium restriction, and importance of weight loss and exercise. Labs reviewed.  2. Will plan on repeat 2 D Echo after approximately 6 months of goal blood pressure control.  3. CKD 3: stable  4. Tobacco: cessation counseling again done today  5. Stroke: no further symptoms. Continue plavix.   6. Skin rash: She complains of dry skin and rash. I don't appreciate a rash but it's possible that sun exposure in the setting of multiple antihypertensives is affecting her skin. We discussed standard  precautions.  Current medicines are reviewed with the patient today.  The patient does not have concerns regarding medicines.  Labs/ tests ordered today include:   Orders Placed This Encounter  Procedures  . Basic Metabolic Panel (BMET)  . Echocardiogram    Disposition:   FU 3 months with Audrie Lia Pharm D in HTN Clinic with a metabolic panel and I will see back in 6 months with an echo.  Enzo Bi, MD  04/30/2015 6:07 PM    [facility] [address] [place], [place], [place]  [address] Phone: [phone]; Fax: [phone]

## 2015-04-30 NOTE — Patient Instructions (Signed)
Medication Instructions:  Your physician recommends that you continue on your current medications as directed. Please refer to the Current Medication list given to you today.  Labwork: Your physician recommends that you return for lab work in: 3 MONTHS for BMP (same day as appointment with Hypertension Clinic)  Testing/Procedures: Your physician has requested that you have an echocardiogram in 6 MONTHS. Echocardiography is a painless test that uses sound waves to create images of your heart. It provides your doctor with information about the size and shape of your heart and how well your heart's chambers and valves are working. This procedure takes approximately one hour. There are no restrictions for this procedure.  Follow-Up: Your physician recommends that you schedule a follow-up appointment in: 3 MONTHS with Audrie Lia Pharm-D for Hypertension Clinic  Your physician wants you to follow-up in: 6 MONTHS with Dr Excell Seltzer.  You will receive a reminder letter in the mail two months in advance. If you don't receive a letter, please call our office to schedule the follow-up appointment.   Any Other Special Instructions Will Be Listed Below (If Applicable).

## 2015-06-14 ENCOUNTER — Telehealth: Payer: Self-pay | Admitting: Internal Medicine

## 2015-06-14 ENCOUNTER — Telehealth: Payer: Self-pay

## 2015-06-14 ENCOUNTER — Other Ambulatory Visit: Payer: Self-pay | Admitting: Internal Medicine

## 2015-06-14 DIAGNOSIS — I6389 Other cerebral infarction: Secondary | ICD-10-CM

## 2015-06-14 MED ORDER — CLOPIDOGREL BISULFATE 75 MG PO TABS: 75.0000 mg | ORAL_TABLET | Freq: Every day | ORAL | Status: DC

## 2015-06-14 NOTE — Telephone Encounter (Signed)
Returned patient phone call Patient requesting a refill on her plavix Informed patient i can only give her a thirty day supply And will need to make an appointment for further refills

## 2015-06-14 NOTE — Telephone Encounter (Signed)
Patient called to request med refills on all of her current medications. Please f/u with pt.

## 2015-06-21 ENCOUNTER — Encounter (HOSPITAL_COMMUNITY): Payer: Self-pay

## 2015-06-21 ENCOUNTER — Emergency Department (HOSPITAL_COMMUNITY)
Admission: EM | Admit: 2015-06-21 | Discharge: 2015-06-21 | Disposition: A | Discharge status: Home / Self Care | Payer: Medicaid Other | Source: Home / Self Care | Attending: Family Medicine | Admitting: Family Medicine

## 2015-06-21 DIAGNOSIS — Z720 Tobacco use: Secondary | ICD-10-CM

## 2015-06-21 DIAGNOSIS — N184 Chronic kidney disease, stage 4 (severe): Secondary | ICD-10-CM

## 2015-06-21 DIAGNOSIS — M62838 Other muscle spasm: Secondary | ICD-10-CM | POA: Diagnosis not present

## 2015-06-21 MED ORDER — DIAZEPAM 5 MG/ML IJ SOLN
INTRAMUSCULAR | Status: AC
  Filled 2015-06-21: qty 2

## 2015-06-21 MED ORDER — DIAZEPAM 5 MG/ML IJ SOLN: 5.0000 mg | Freq: Once | INTRAMUSCULAR | Status: DC

## 2015-06-21 MED ORDER — DIAZEPAM 5 MG/ML IJ SOLN
5.0000 mg | Freq: Once | INTRAMUSCULAR | Status: AC
  Administered 2015-06-21: 5 mg via INTRAMUSCULAR

## 2015-06-21 MED ORDER — METHOCARBAMOL 500 MG PO TABS: 500.0000 mg | ORAL_TABLET | Freq: Four times a day (QID) | ORAL | Status: DC | PRN

## 2015-06-21 NOTE — ED Provider Notes (Signed)
CSN: [id number]     Arrival date & time 06/21/15  1902 History   First MD Initiated Contact with Patient 06/21/15 1925     Chief Complaint  Patient presents with  . Back Pain   (Consider location/radiation/quality/duration/timing/severity/associated sxs/prior Treatment) HPI  Back pain: started this morning. R middle back. "Feels like muscle spasms." pt has had to do more lifting and bending lately due to watching a 47 year old over the past couple of weeks. Worse w/ certain movements. Non radiating. Heating pad w/o improvement. Not getting better or worse. Comes and goes the fairly constant. Denies any dysuria, frequency, fevers, nausea, vomiting, abdominal pain, constipation, trauma, chest pain, shortness of breath, palpitations.     Past Medical History  Diagnosis Date  . Hypertension   . Asthma   . Cardiomyopathy     a. 09/2014 Echo: EF 30-35%, inflat, inf, infsept AK, Gr 2 DD-->No ischemic w/u 2/2 recent CVA and body habitus.  . CVA (cerebral infarction)     a. Acute sub cm LEFT basal ganglia/internal capsule lacunar infarct, normal MRA.  . Morbid obesity   . Tobacco abuse   . CKD (chronic kidney disease), stage III    Past Surgical History  Procedure Laterality Date  . Abdominal hysterectomy    . Cesarean section     Family History  Problem Relation Age of Onset  . Hypertension Mother   . Cancer Mother   . Hypertension Father   . Hypertension Maternal Grandmother   . Hypertension Maternal Grandfather   . Hypertension Paternal Grandmother   . Hypertension Paternal Grandfather    Social History  Substance Use Topics  . Smoking status: Current Every Day Smoker -- 0.50 packs/day for 30 years    Types: Cigarettes  . Smokeless tobacco: None  . Alcohol Use: No     Comment: occ   OB History    No data available     Review of Systems Per HPI with all other pertinent systems negative.   Allergies  Altace and Aspirin  Home Medications   Prior to Admission  medications   Medication Sig Start Date End Date Taking? Authorizing Provider  albuterol (PROVENTIL HFA;VENTOLIN HFA) 108 (90 BASE) MCG/ACT inhaler Inhale 2 puffs into the lungs every 6 (six) hours as needed for wheezing or shortness of breath. 10/10/14   Doris Cheadle, MD  amLODipine (NORVASC) 10 MG tablet Take 1 tablet (10 mg total) by mouth daily. 04/30/15   Tonny Bollman, MD  clopidogrel (PLAVIX) 75 MG tablet Take 1 tablet (75 mg total) by mouth daily with breakfast. 06/14/15   Doris Cheadle, MD  hydrALAZINE (APRESOLINE) 10 MG tablet Take 1 tablet (10 mg total) by mouth 3 (three) times daily. 04/30/15   Tonny Bollman, MD  indapamide (LOZOL) 2.5 MG tablet Take 1 tablet (2.5 mg total) by mouth daily. 04/30/15   Tonny Bollman, MD  isosorbide mononitrate (IMDUR) 30 MG 24 hr tablet Take 1 tablet (30 mg total) by mouth daily. 04/30/15   Tonny Bollman, MD  methocarbamol (ROBAXIN) 500 MG tablet Take 1-2 tablets (500-1,000 mg total) by mouth every 6 (six) hours as needed for muscle spasms. 06/21/15   Ozella Rocks, MD  metoprolol succinate (TOPROL-XL) 100 MG 24 hr tablet Take 1 tablet (100 mg total) by mouth 2 (two) times daily. Take with or immediately following a meal. 04/30/15   Tonny Bollman, MD  potassium chloride SA (K-DUR,KLOR-CON) 20 MEQ tablet Take 1 tablet (20 mEq total) by mouth daily. 04/30/15  Tonny Bollman, MD  simvastatin (ZOCOR) 20 MG tablet Take 1 tablet (20 mg total) by mouth at bedtime. Patient not taking: Reported on 04/30/2015 10/10/14   Doris Cheadle, MD  spironolactone (ALDACTONE) 25 MG tablet Take 1 tablet (25 mg total) by mouth daily. 04/30/15   Tonny Bollman, MD   BP 156/81 mmHg  Pulse 86  Temp(Src) 98.3 F (36.8 C) (Oral)  Resp 16  SpO2 96% Physical Exam Physical Exam  Constitutional: oriented to person, place, and time. appears well-developed and well-nourished. No distress.  HENT:  Head: Normocephalic and atraumatic.  Eyes: EOMI. PERRL.  Neck: Normal range of motion.   Cardiovascular: RRR, no m/r/g, 2+ distal pulses,  Pulmonary/Chest: Effort normal and breath sounds normal. No respiratory distress.  Abdominal: Soft. Bowel sounds are normal. NonTTP, no distension.  Musculoskeletal: Mid right back with intermittent generalized muscle tenderness to palpation. No CVA tenderness.  Neurological: alert and oriented to person, place, and time.  Skin: Skin is warm. No rash noted. non diaphoretic.  Psychiatric: normal mood and affect. behavior is normal. Judgment and thought content normal.   ED Course  Procedures (including critical care time) Labs Review Labs Reviewed - No data to display  Imaging Review No results found.   MDM   1. Muscle spasm   2. CKD (chronic kidney disease) stage 4, GFR 15-29 ml/min   3. Tobacco use     Creatinine 2.1. Patient unable to tolerate NSAIDs. Gave patient 5 mg Valium IM and perception for Robaxin and instructions for stretching, exercises, heat, massage. No narcotics at this time.  Tobacco cessation counseling provided.      Ozella Rocks, MD 06/21/15 [phone]

## 2015-06-21 NOTE — ED Notes (Signed)
Patient states she started having lower right sided back pain this am Denies any injury Patient did states she used to suffer from muscle spasms but not sure if That is what is causing her pain

## 2015-06-21 NOTE — Discharge Instructions (Signed)
You develop some muscle strain. Please remember to stretch the area multiple times per day. Apply heat and ice as needed for additional relief. Please use the muscle relaxers help ease the tension on the muscle. Your given a dose of Valium to also help with this. He also take Tylenol 1000 mg every 8 hours for additional pain relief. Remember to stay active. This should resolve over the next couple days. Take all the other medications as prescribed.

## 2015-06-25 ENCOUNTER — Ambulatory Visit: Payer: Medicaid Other | Admitting: Family Medicine

## 2015-07-06 ENCOUNTER — Encounter: Payer: Self-pay | Admitting: Family Medicine

## 2015-07-06 ENCOUNTER — Ambulatory Visit: Payer: Medicaid Other | Attending: Family Medicine | Admitting: Family Medicine

## 2015-07-06 ENCOUNTER — Encounter (HOSPITAL_BASED_OUTPATIENT_CLINIC_OR_DEPARTMENT_OTHER): Payer: Self-pay | Admitting: Clinical

## 2015-07-06 VITALS — BP 154/88 | HR 76 | Temp 98.1°F | Resp 16 | Wt 275.6 lb

## 2015-07-06 DIAGNOSIS — I10 Essential (primary) hypertension: Secondary | ICD-10-CM

## 2015-07-06 DIAGNOSIS — I119 Hypertensive heart disease without heart failure: Secondary | ICD-10-CM

## 2015-07-06 DIAGNOSIS — I638 Other cerebral infarction: Secondary | ICD-10-CM

## 2015-07-06 DIAGNOSIS — F411 Generalized anxiety disorder: Secondary | ICD-10-CM

## 2015-07-06 DIAGNOSIS — B353 Tinea pedis: Secondary | ICD-10-CM | POA: Insufficient documentation

## 2015-07-06 DIAGNOSIS — I6389 Other cerebral infarction: Secondary | ICD-10-CM

## 2015-07-06 DIAGNOSIS — I429 Cardiomyopathy, unspecified: Secondary | ICD-10-CM

## 2015-07-06 DIAGNOSIS — I43 Cardiomyopathy in diseases classified elsewhere: Secondary | ICD-10-CM

## 2015-07-06 MED ORDER — CLOPIDOGREL BISULFATE 75 MG PO TABS: 75.0000 mg | ORAL_TABLET | Freq: Every day | ORAL | Status: DC

## 2015-07-06 MED ORDER — CLOTRIMAZOLE 1 % EX CREA: 1.0000 "application " | TOPICAL_CREAM | Freq: Two times a day (BID) | CUTANEOUS | Status: DC

## 2015-07-06 MED ORDER — METOPROLOL SUCCINATE ER 100 MG PO TB24: 100.0000 mg | ORAL_TABLET | Freq: Two times a day (BID) | ORAL | Status: DC

## 2015-07-06 NOTE — Patient Instructions (Signed)
It was a pleasure to meet you today.   For the foot itch and scaly skin, I am sending instructions for CLOTRIMAZOLE 1% cream, apply to soles of feet twice daily for up to 2 weeks.   For your blood pressure, please try increasing the hydralazine 10mg  to twice daily (instead of the once-daily as you have been taking it).   Follow up with Cardiologist in Sept as planned.   Prescriptions sent to your [facility] pharmacy.

## 2015-07-06 NOTE — Progress Notes (Signed)
   Subjective:    Patient ID: Andrea Clements, female    DOB: [birth year], 49 y.o.   MRN: [id number]  HPI Patient seen today for follow up of HTN, med reconciliation reveals that she is taking Hydralazine 10mg  only once daily (not sure what her intolerance was with TID dosing).  Denies chest pain or shortness of breath. No fevers or chills.   Complains of bilateral foot itch, worsening of flaky skin lesions that have been present for months but worse in the summer.  Some itch in palms as well. No vesicular formation. Used an otc moisturizer cream without relief.   Social Hx; Smoking 1ppd since age 26, no smokers in home.  Discussed strategies for smoking cessation. She had tried pharmacotherapy for this but not sure which one, caused headache.   L leg numbness when gets up from sitting on toilet. No radiating pain to lower leg. No weakness or falls.   Med Allergies: ASA and ACEI, both gave "throat swelling".    Reviewed records, including ECHO March 2016  With EF 35%.  Followed by Cardiologist Dr Excell Seltzer Review of Systems     Objective:   Physical Exam Well appearing, no distress HEENT Neck supple, No cervical adenopathy or JVD COR regular S1S2 PULM Clear bilaterally, no rales or wheezes EXTS: No pedal edema.  SKIN: Flaky patches across soles bilat feet. No maceration between digits. Palpable dp pulses bilat NEURO SLR negative bilat.Full active ROM in both legs with hip flexion, int/ext rotation.  No tenderness over greater trochanter of L hip.        Assessment & Plan:

## 2015-07-06 NOTE — Progress Notes (Signed)
ASSESSMENT: Pt currently experiencing symptoms of anxiety. Pt needs to f/u with PCP and would benefit from psychoeducation and supportive counseling regarding coping with symptoms of anxiety.  Stage of Change: contemplative  PLAN: 1. F/U with behavioral health consultant in as needed 2. Psychiatric Medications: none. 3. Behavioral recommendation(s):   -Consider reading educational materials regarding coping with anxiety. -Consider additional strategies regarding coping with symptoms of anxiety.  SUBJECTIVE: Pt. referred by Dr Mauricio Po for symptoms of anxiety:  Pt. reports the following symptoms/concerns: Pt says that she worries a lot about her children (15,16) and her health, and has some sleep difficulties as a result of worrying too much; she has had some success in talking herself down from panic attacks in the past, and does not have them anymore.  Duration of problem: about 9 months Severity: mild  OBJECTIVE: Orientation & Cognition: Oriented x3. Thought processes normal and appropriate to situation. Mood: appropriate. Affect: apropriate Appearance: appropriate Risk of harm to self or others: no risk of harm to self or others Substance use: none Assessments administered: PHQ9: 6/ GAD7: 10  Diagnosis: Anxiety CPT Code: F41.1 -------------------------------------------- Other(s) present in the room: none  Time spent with patient in exam room: 16 minutes

## 2015-07-06 NOTE — Progress Notes (Signed)
Here for left leg numbness States when she sit for a long time her leg goes numb

## 2015-07-16 ENCOUNTER — Emergency Department (INDEPENDENT_AMBULATORY_CARE_PROVIDER_SITE_OTHER)
Admission: EM | Admit: 2015-07-16 | Discharge: 2015-07-16 | Disposition: A | Discharge status: Home / Self Care | Payer: Medicaid Other | Source: Home / Self Care | Attending: Family Medicine | Admitting: Family Medicine

## 2015-07-16 ENCOUNTER — Encounter (HOSPITAL_COMMUNITY): Payer: Self-pay | Admitting: Emergency Medicine

## 2015-07-16 DIAGNOSIS — M62830 Muscle spasm of back: Secondary | ICD-10-CM | POA: Diagnosis not present

## 2015-07-16 DIAGNOSIS — M546 Pain in thoracic spine: Secondary | ICD-10-CM

## 2015-07-16 DIAGNOSIS — M791 Myalgia, unspecified site: Secondary | ICD-10-CM

## 2015-07-16 MED ORDER — DICLOFENAC SODIUM 1 % TD GEL: 1.0000 "application " | Freq: Four times a day (QID) | TRANSDERMAL | Status: DC

## 2015-07-16 MED ORDER — METAXALONE 800 MG PO TABS: 400.0000 mg | ORAL_TABLET | Freq: Three times a day (TID) | ORAL | Status: DC

## 2015-07-16 NOTE — Discharge Instructions (Signed)
Back Pain, Adult °Low back pain is very common. About 1 in 5 people have back pain. The cause of low back pain is rarely dangerous. The pain often gets better over time. About half of people with a sudden onset of back pain feel better in just 2 weeks. About 8 in 10 people feel better by 6 weeks.  °CAUSES °Some common causes of back pain include: °· Strain of the muscles or ligaments supporting the spine. °· Wear and tear (degeneration) of the spinal discs. °· Arthritis. °· Direct injury to the back. °DIAGNOSIS °Most of the time, the direct cause of low back pain is not known. However, back pain can be treated effectively even when the exact cause of the pain is unknown. Answering your caregiver's questions about your overall health and symptoms is one of the most accurate ways to make sure the cause of your pain is not dangerous. If your caregiver needs more information, he or she may order lab work or imaging tests (X-rays or MRIs). However, even if imaging tests show changes in your back, this usually does not require surgery. °HOME CARE INSTRUCTIONS °For many people, back pain returns. Since low back pain is rarely dangerous, it is often a condition that people can learn to manage on their own.  °· Remain active. It is stressful on the back to sit or stand in one place. Do not sit, drive, or stand in one place for more than 30 minutes at a time. Take short walks on level surfaces as soon as pain allows. Try to increase the length of time you walk each day. °· Do not stay in bed. Resting more than 1 or 2 days can delay your recovery. °· Do not avoid exercise or work. Your body is made to move. It is not dangerous to be active, even though your back may hurt. Your back will likely heal faster if you return to being active before your pain is gone. °· Pay attention to your body when you  bend and lift. Many people have less discomfort when lifting if they bend their knees, keep the load close to their bodies, and  avoid twisting. Often, the most comfortable positions are those that put less stress on your recovering back. °· Find a comfortable position to sleep. Use a firm mattress and lie on your side with your knees slightly bent. If you lie on your back, put a pillow under your knees. °· Only take over-the-counter or prescription medicines as directed by your caregiver. Over-the-counter medicines to reduce pain and inflammation are often the most helpful. Your caregiver may prescribe muscle relaxant drugs. These medicines help dull your pain so you can more quickly return to your normal activities and healthy exercise. °· Put ice on the injured area. °¨ Put ice in a plastic bag. °¨ Place a towel between your skin and the bag. °¨ Leave the ice on for 15-20 minutes, 03-04 times a day for the first 2 to 3 days. After that, ice and heat may be alternated to reduce pain and spasms. °· Ask your caregiver about trying back exercises and gentle massage. This may be of some benefit. °· Avoid feeling anxious or stressed. Stress increases muscle tension and can worsen back pain. It is important to recognize when you are anxious or stressed and learn ways to manage it. Exercise is a great option. °SEEK MEDICAL CARE IF: °· You have pain that is not relieved with rest or medicine. °· You have pain that does not improve in 1 week. °· You have new symptoms. °· You are generally not feeling well. °SEEK   IMMEDIATE MEDICAL CARE IF:   You have pain that radiates from your back into your legs.  You develop new bowel or bladder control problems.  You have unusual weakness or numbness in your arms or legs.  You develop nausea or vomiting.  You develop abdominal pain.  You feel faint. Document Released: 10/27/2005 Document Revised: 04/27/2012 Document Reviewed: 02/28/2014 [facility] Patient Information 2015 [place], [place]. This information is not intended to replace advice given to you by your health care provider. Make sure you  discuss any questions you have with your health care provider.  Heat Therapy Heat therapy can help ease sore, stiff, injured, and tight muscles and joints. Heat relaxes your muscles, which may help ease your pain.  RISKS AND COMPLICATIONS If you have any of the following conditions, do not use heat therapy unless your health care provider has approved:  Poor circulation.  Healing wounds or scarred skin in the area being treated.  Diabetes, heart disease, or high blood pressure.  Not being able to feel (numbness) the area being treated.  Unusual swelling of the area being treated.  Active infections.  Blood clots.  Cancer.  Inability to communicate pain. This may include young children and people who have problems with their brain function (dementia).  Pregnancy. Heat therapy should only be used on old, pre-existing, or long-lasting (chronic) injuries. Do not use heat therapy on new injuries unless directed by your health care provider. HOW TO USE HEAT THERAPY There are several different kinds of heat therapy, including:  Moist heat pack.  Warm water bath.  Hot water bottle.  Electric heating pad.  Heated gel pack.  Heated wrap.  Electric heating pad. Use the heat therapy method suggested by your health care provider. Follow your health care provider's instructions on when and how to use heat therapy. GENERAL HEAT THERAPY RECOMMENDATIONS  Do not sleep while using heat therapy. Only use heat therapy while you are awake.  Your skin may turn pink while using heat therapy. Do not use heat therapy if your skin turns red.  Do not use heat therapy if you have new pain.  High heat or long exposure to heat can cause burns. Be careful when using heat therapy to avoid burning your skin.  Do not use heat therapy on areas of your skin that are already irritated, such as with a rash or sunburn. SEEK MEDICAL CARE IF:  You have blisters, redness, swelling, or numbness.  You  have new pain.  Your pain is worse. MAKE SURE YOU:  Understand these instructions.  Will watch your condition.  Will get help right away if you are not doing well or get worse. Document Released: 01/19/2012 Document Revised: 03/13/2014 Document Reviewed: 12/20/2013 [facility] Patient Information 2015 [place], [place]. This information is not intended to replace advice given to you by your health care provider. Make sure you discuss any questions you have with your health care provider.  Muscle Cramps and Spasms Muscle cramps and spasms are when muscles tighten by themselves. They usually get better within minutes. Muscle cramps are painful. They are usually stronger and last longer than muscle spasms. Muscle spasms may or may not be painful. They can last a few seconds or much longer. HOME CARE  Drink enough fluid to keep your pee (urine) clear or pale yellow.  Massage, stretch, and relax the muscle.  Use a warm towel, heating pad, or warm shower water on tight muscles.  Place ice on the muscle if it is tender  or in pain.  Put ice in a plastic bag.  Place a towel between your skin and the bag.  Leave the ice on for 15-20 minutes, 03-04 times a day.  Only take medicine as told by your doctor. GET HELP RIGHT AWAY IF:  Your cramps or spasms get worse, happen more often, or do not get better with time. MAKE SURE YOU:  Understand these instructions.  Will watch your condition.  Will get help right away if you are not doing well or get worse. Document Released: 10/09/2008 Document Revised: 02/21/2013 Document Reviewed: 10/13/2012 [facility] Patient Information 2015 [place], [place]. This information is not intended to replace advice given to you by your health care provider. Make sure you discuss any questions you have with your health care provider.  Muscle Pain Muscle pain (myalgia) may be caused by many things, including:  Overuse or muscle strain, especially if you are not in  shape. This is the most common cause of muscle pain.  Injury.  Bruises.  Viruses, such as the flu.  Infectious diseases.  Fibromyalgia, which is a chronic condition that causes muscle tenderness, fatigue, and headache.  Autoimmune diseases, including lupus.  Certain drugs, including ACE inhibitors and statins. Muscle pain may be mild or severe. In most cases, the pain lasts only a short time and goes away without treatment. To diagnose the cause of your muscle pain, your health care provider will take your medical history. This means he or she will ask you when your muscle pain began and what has been happening. If you have not had muscle pain for very long, your health care provider may want to wait before doing much testing. If your muscle pain has lasted a long time, your health care provider may want to run tests right away. If your health care provider thinks your muscle pain may be caused by illness, you may need to have additional tests to rule out certain conditions.  Treatment for muscle pain depends on the cause. Home care is often enough to relieve muscle pain. Your health care provider may also prescribe anti-inflammatory medicine. HOME CARE INSTRUCTIONS Watch your condition for any changes. The following actions may help to lessen any discomfort you are feeling:  Only take over-the-counter or prescription medicines as directed by your health care provider.  Apply ice to the sore muscle:  Put ice in a plastic bag.  Place a towel between your skin and the bag.  Leave the ice on for 15-20 minutes, 3-4 times a day.  You may alternate applying hot and cold packs to the muscle as directed by your health care provider.  If overuse is causing your muscle pain, slow down your activities until the pain goes away.  Remember that it is normal to feel some muscle pain after starting a workout program. Muscles that have not been used often will be sore at first.  Do regular, gentle  exercises if you are not usually active.  Warm up before exercising to lower your risk of muscle pain.  Do not continue working out if the pain is very bad. Bad pain could mean you have injured a muscle. SEEK MEDICAL CARE IF:  Your muscle pain gets worse, and medicines do not help.  You have muscle pain that lasts longer than 3 days.  You have a rash or fever along with muscle pain.  You have muscle pain after a tick bite.  You have muscle pain while working out, even though you are in  good physical condition.  You have redness, soreness, or swelling along with muscle pain.  You have muscle pain after starting a new medicine or changing the dose of a medicine. SEEK IMMEDIATE MEDICAL CARE IF:  You have trouble breathing.  You have trouble swallowing.  You have muscle pain along with a stiff neck, fever, and vomiting.  You have severe muscle weakness or cannot move part of your body. MAKE SURE YOU:   Understand these instructions.  Will watch your condition.  Will get help right away if you are not doing well or get worse. Document Released: 09/18/2006 Document Revised: 11/01/2013 Document Reviewed: 08/23/2013 [facility] Patient Information 2015 [place], [place]. This information is not intended to replace advice given to you by your health care provider. Make sure you discuss any questions you have with your health care provider.

## 2015-07-16 NOTE — ED Notes (Signed)
Pt states she started with muscle spasms in her lower back a few days ago, but now she has them all over her body.

## 2015-07-16 NOTE — ED Provider Notes (Signed)
CSN: [id number]     Arrival date & time 07/16/15  1332 History   First MD Initiated Contact with Patient 07/16/15 1359     Chief Complaint  Patient presents with  . Spasms   (Consider location/radiation/quality/duration/timing/severity/associated sxs/prior Treatment) HPI Comments: 49 year old morbidly and severely severely obese sedentary female with a history of CKD IV, hypertension, dyslipidemia, cardiomyopathy and CVA presents a second time to the urgent care for parathoracic muscle pain. She was here on August 11 for the same complaint after trying to lift her 86-year-old child  several times. She was administered Valium at that time and he prescribed tramadol. She says is not working as well. Pain is worse with bending and other types of movement. It is better when lying supine with her head of the bed up. She states she was getting out of her car at [facility] when she suddenly experienced total body spasms to 3 days ago.   Past Medical History  Diagnosis Date  . Hypertension   . Asthma   . Cardiomyopathy     a. 09/2014 Echo: EF 30-35%, inflat, inf, infsept AK, Gr 2 DD-->No ischemic w/u 2/2 recent CVA and body habitus.  . CVA (cerebral infarction)     a. Acute sub cm LEFT basal ganglia/internal capsule lacunar infarct, normal MRA.  . Morbid obesity   . Tobacco abuse   . CKD (chronic kidney disease), stage III    Past Surgical History  Procedure Laterality Date  . Abdominal hysterectomy    . Cesarean section     Family History  Problem Relation Age of Onset  . Hypertension Mother   . Cancer Mother   . Hypertension Father   . Hypertension Maternal Grandmother   . Hypertension Maternal Grandfather   . Hypertension Paternal Grandmother   . Hypertension Paternal Grandfather    Social History  Substance Use Topics  . Smoking status: Former Smoker -- 0.50 packs/day for 30 years    Types: Cigarettes  . Smokeless tobacco: None  . Alcohol Use: No     Comment: occ   OB History    No data available     Review of Systems  Constitutional: Negative for fever and activity change.  Respiratory: Negative.   Musculoskeletal: Positive for myalgias and back pain. Negative for neck pain.       As per HPI  Skin: Negative for color change.  Neurological: Negative.   All other systems reviewed and are negative.   Allergies  Altace and Aspirin  Home Medications   Prior to Admission medications   Medication Sig Start Date End Date Taking? Authorizing Provider  albuterol (PROVENTIL HFA;VENTOLIN HFA) 108 (90 BASE) MCG/ACT inhaler Inhale 2 puffs into the lungs every 6 (six) hours as needed for wheezing or shortness of breath. 10/10/14   Doris Cheadle, MD  amLODipine (NORVASC) 10 MG tablet Take 1 tablet (10 mg total) by mouth daily. 04/30/15   Tonny Bollman, MD  clopidogrel (PLAVIX) 75 MG tablet Take 1 tablet (75 mg total) by mouth daily with breakfast. 07/06/15   Barbaraann Barthel, MD  clotrimazole (LOTRIMIN) 1 % cream Apply 1 application topically 2 (two) times daily. 07/06/15   Barbaraann Barthel, MD  diclofenac sodium (VOLTAREN) 1 % GEL Apply 1 application topically 4 (four) times daily. 07/16/15   Hayden Rasmussen, NP  hydrALAZINE (APRESOLINE) 10 MG tablet Take 1 tablet (10 mg total) by mouth 3 (three) times daily. 04/30/15   Tonny Bollman, MD  indapamide (LOZOL) 2.5 MG  tablet Take 1 tablet (2.5 mg total) by mouth daily. 04/30/15   Tonny Bollman, MD  isosorbide mononitrate (IMDUR) 30 MG 24 hr tablet Take 1 tablet (30 mg total) by mouth daily. 04/30/15   Tonny Bollman, MD  metaxalone (SKELAXIN) 800 MG tablet Take 0.5 tablets (400 mg total) by mouth 3 (three) times daily. 07/16/15   Hayden Rasmussen, NP  metoprolol succinate (TOPROL-XL) 100 MG 24 hr tablet Take 1 tablet (100 mg total) by mouth 2 (two) times daily. Take with or immediately following a meal. 07/06/15   Barbaraann Barthel, MD  potassium chloride SA (K-DUR,KLOR-CON) 20 MEQ tablet Take 1 tablet (20 mEq total) by mouth daily. 04/30/15   Tonny Bollman,  MD  spironolactone (ALDACTONE) 25 MG tablet Take 1 tablet (25 mg total) by mouth daily. 04/30/15   Tonny Bollman, MD   Meds Ordered and Administered this Visit  Medications - No data to display  BP 152/90 mmHg  Pulse 85  Temp(Src) 99.1 F (37.3 C) (Oral)  Resp 16  SpO2 95% No data found.   Physical Exam  Constitutional: She is oriented to person, place, and time. She appears well-developed and well-nourished. No distress.  Eyes: EOM are normal.  Neck: Normal range of motion. Neck supple.  Cardiovascular: Normal rate.   Pulmonary/Chest: Effort normal. No respiratory distress.  Musculoskeletal: She exhibits tenderness. She exhibits no edema.  Tenderness across the musculature. No direct spinal tenderness. No muscle weakness. Distal neurovascular is intact. Patient is ambulatory.  Neurological: She is alert and oriented to person, place, and time. She exhibits normal muscle tone.  Skin: Skin is warm and dry.  Psychiatric: She has a normal mood and affect.  Nursing note and vitals reviewed.   ED Course  Procedures (including critical care time)  Labs Review Labs Reviewed - No data to display  Imaging Review No results found.   Visual Acuity Review  Right Eye Distance:   Left Eye Distance:   Bilateral Distance:    Right Eye Near:   Left Eye Near:    Bilateral Near:         MDM   1. Bilateral thoracic back pain   2. Myalgia   3. Muscle spasm of back    Pt will benefit best with PT of back, referral p 1 Skelaxin as directed Heat and diclofenac gel. No po NSAIDS. Stretches     Hayden Rasmussen, NP 07/16/15 1424

## 2015-07-25 ENCOUNTER — Encounter (HOSPITAL_COMMUNITY): Payer: Self-pay | Admitting: Emergency Medicine

## 2015-07-25 ENCOUNTER — Emergency Department (HOSPITAL_COMMUNITY): Payer: Medicaid Other

## 2015-07-25 ENCOUNTER — Emergency Department (HOSPITAL_COMMUNITY)
Admission: EM | Admit: 2015-07-25 | Discharge: 2015-07-25 | Disposition: A | Discharge status: Home / Self Care | Payer: Medicaid Other | Attending: Emergency Medicine | Admitting: Emergency Medicine

## 2015-07-25 DIAGNOSIS — Z87891 Personal history of nicotine dependence: Secondary | ICD-10-CM | POA: Diagnosis not present

## 2015-07-25 DIAGNOSIS — J189 Pneumonia, unspecified organism: Secondary | ICD-10-CM

## 2015-07-25 DIAGNOSIS — I129 Hypertensive chronic kidney disease with stage 1 through stage 4 chronic kidney disease, or unspecified chronic kidney disease: Secondary | ICD-10-CM | POA: Diagnosis not present

## 2015-07-25 DIAGNOSIS — Z79899 Other long term (current) drug therapy: Secondary | ICD-10-CM | POA: Diagnosis not present

## 2015-07-25 DIAGNOSIS — N183 Chronic kidney disease, stage 3 (moderate): Secondary | ICD-10-CM | POA: Diagnosis not present

## 2015-07-25 DIAGNOSIS — J45909 Unspecified asthma, uncomplicated: Secondary | ICD-10-CM | POA: Insufficient documentation

## 2015-07-25 DIAGNOSIS — R05 Cough: Secondary | ICD-10-CM | POA: Diagnosis present

## 2015-07-25 DIAGNOSIS — J159 Unspecified bacterial pneumonia: Secondary | ICD-10-CM | POA: Insufficient documentation

## 2015-07-25 DIAGNOSIS — R059 Cough, unspecified: Secondary | ICD-10-CM

## 2015-07-25 DIAGNOSIS — Z7902 Long term (current) use of antithrombotics/antiplatelets: Secondary | ICD-10-CM | POA: Insufficient documentation

## 2015-07-25 LAB — I-STAT CHEM 8, ED
BUN: 35 mg/dL — ABNORMAL HIGH (ref 6–20)
CREATININE: 2.5 mg/dL — AB (ref 0.44–1.00)
Calcium, Ion: 0.93 mmol/L — ABNORMAL LOW (ref 1.12–1.23)
Chloride: 107 mmol/L (ref 101–111)
Glucose, Bld: 121 mg/dL — ABNORMAL HIGH (ref 65–99)
HEMATOCRIT: 39 % (ref 36.0–46.0)
Hemoglobin: 13.3 g/dL (ref 12.0–15.0)
POTASSIUM: 4 mmol/L (ref 3.5–5.1)
SODIUM: 137 mmol/L (ref 135–145)
TCO2: 24 mmol/L (ref 0–100)

## 2015-07-25 LAB — CBC WITH DIFFERENTIAL/PLATELET
Basophils Absolute: 0 10*3/uL (ref 0.0–0.1)
Basophils Relative: 0 %
Eosinophils Absolute: 0.2 10*3/uL (ref 0.0–0.7)
Eosinophils Relative: 3 %
HCT: 38 % (ref 36.0–46.0)
HEMOGLOBIN: 12.3 g/dL (ref 12.0–15.0)
LYMPHS PCT: 21 %
Lymphs Abs: 1.7 10*3/uL (ref 0.7–4.0)
MCH: 29.2 pg (ref 26.0–34.0)
MCHC: 32.4 g/dL (ref 30.0–36.0)
MCV: 90.3 fL (ref 78.0–100.0)
Monocytes Absolute: 0.4 10*3/uL (ref 0.1–1.0)
Monocytes Relative: 5 %
NEUTROS ABS: 5.7 10*3/uL (ref 1.7–7.7)
NEUTROS PCT: 71 %
Platelets: 332 10*3/uL (ref 150–400)
RBC: 4.21 MIL/uL (ref 3.87–5.11)
RDW: 15.2 % (ref 11.5–15.5)
WBC: 8 10*3/uL (ref 4.0–10.5)

## 2015-07-25 MED ORDER — BENZONATATE 200 MG PO CAPS: 200.0000 mg | ORAL_CAPSULE | Freq: Three times a day (TID) | ORAL | Status: DC | PRN

## 2015-07-25 MED ORDER — BENZONATATE 100 MG PO CAPS
200.0000 mg | ORAL_CAPSULE | Freq: Once | ORAL | Status: AC
  Administered 2015-07-25: 200 mg via ORAL
  Filled 2015-07-25: qty 2

## 2015-07-25 MED ORDER — ONDANSETRON HCL 4 MG/2ML IJ SOLN: 4.0000 mg | Freq: Once | INTRAMUSCULAR | Status: DC

## 2015-07-25 MED ORDER — AZITHROMYCIN 250 MG PO TABS: ORAL_TABLET | ORAL | Status: DC

## 2015-07-25 NOTE — ED Notes (Signed)
Pt reports congested cough with no sputum for one week. Has not tried to take anything over the counter.

## 2015-07-25 NOTE — ED Provider Notes (Signed)
CSN: [id number]     Arrival date & time 07/25/15  1220 History   First MD Initiated Contact with Patient 07/25/15 1433     Chief Complaint  Patient presents with  . Cough  . Generalized Body Aches    HPI Andrea Clements is a 49 y.o. female with past medical history significant for CVA, CAD, hypertension, hyperlipidemia, cardiomyopathy who presents to the ED with complaints of a dry cough for the last week. Patient states that about 2-3 weeks ago she had a flulike illness for cough or have body aches sore throat and fill [place]. She states that she got better without meds about a week later. She states that after the illness she got a dry cough. She's had a cough for about a week now. No sputum production no blood. The cough is worse at night. Had any medicines for cough due to being scared that it might affect her blood pressure for other chronic diseases.   Past Medical History  Diagnosis Date  . Hypertension   . Asthma   . Cardiomyopathy     a. 09/2014 Echo: EF 30-35%, inflat, inf, infsept AK, Gr 2 DD-->No ischemic w/u 2/2 recent CVA and body habitus.  . CVA (cerebral infarction)     a. Acute sub cm LEFT basal ganglia/internal capsule lacunar infarct, normal MRA.  . Morbid obesity   . Tobacco abuse   . CKD (chronic kidney disease), stage III    Past Surgical History  Procedure Laterality Date  . Abdominal hysterectomy    . Cesarean section     Family History  Problem Relation Age of Onset  . Hypertension Mother   . Cancer Mother   . Hypertension Father   . Hypertension Maternal Grandmother   . Hypertension Maternal Grandfather   . Hypertension Paternal Grandmother   . Hypertension Paternal Grandfather    Social History  Substance Use Topics  . Smoking status: Former Smoker -- 0.50 packs/day for 30 years    Types: Cigarettes  . Smokeless tobacco: None  . Alcohol Use: No     Comment: occ   OB History    No data available     Review of Systems  HENT: Negative for  congestion and rhinorrhea.   Respiratory: Positive for cough. Negative for shortness of breath.   Cardiovascular: Negative for chest pain.  Gastrointestinal: Negative for nausea, vomiting and abdominal pain.  Musculoskeletal: Negative for myalgias.  Also per HPI  Allergies  Altace and Aspirin  Home Medications   Prior to Admission medications   Medication Sig Start Date End Date Taking? Authorizing Provider  albuterol (PROVENTIL HFA;VENTOLIN HFA) 108 (90 BASE) MCG/ACT inhaler Inhale 2 puffs into the lungs every 6 (six) hours as needed for wheezing or shortness of breath. 10/10/14  Yes Doris Cheadle, MD  amLODipine (NORVASC) 10 MG tablet Take 1 tablet (10 mg total) by mouth daily. 04/30/15  Yes Tonny Bollman, MD  clopidogrel (PLAVIX) 75 MG tablet Take 1 tablet (75 mg total) by mouth daily with breakfast. 07/06/15  Yes Barbaraann Barthel, MD  clotrimazole (LOTRIMIN) 1 % cream Apply 1 application topically 2 (two) times daily. 07/06/15  Yes Barbaraann Barthel, MD  hydrALAZINE (APRESOLINE) 10 MG tablet Take 1 tablet (10 mg total) by mouth 3 (three) times daily. Patient taking differently: Take 10 mg by mouth daily.  04/30/15  Yes Tonny Bollman, MD  indapamide (LOZOL) 2.5 MG tablet Take 1 tablet (2.5 mg total) by mouth daily. 04/30/15  Yes Casimiro Needle  Excell Seltzer, MD  isosorbide mononitrate (IMDUR) 30 MG 24 hr tablet Take 1 tablet (30 mg total) by mouth daily. 04/30/15  Yes Tonny Bollman, MD  metoprolol succinate (TOPROL-XL) 100 MG 24 hr tablet Take 1 tablet (100 mg total) by mouth 2 (two) times daily. Take with or immediately following a meal. 07/06/15  Yes Barbaraann Barthel, MD  potassium chloride SA (K-DUR,KLOR-CON) 20 MEQ tablet Take 1 tablet (20 mEq total) by mouth daily. 04/30/15  Yes Tonny Bollman, MD  spironolactone (ALDACTONE) 25 MG tablet Take 1 tablet (25 mg total) by mouth daily. 04/30/15  Yes Tonny Bollman, MD  azithromycin (ZITHROMAX Z-PAK) 250 MG tablet Take 500mg (2 tab) on day 1 followed by four days of 250  mg(1 tab) a day 07/25/15   Pincus Large, DO  benzonatate (TESSALON) 200 MG capsule Take 1 capsule (200 mg total) by mouth 3 (three) times daily as needed for cough. 07/25/15   Pincus Large, DO  diclofenac sodium (VOLTAREN) 1 % GEL Apply 1 application topically 4 (four) times daily. 07/16/15   Hayden Rasmussen, NP  metaxalone (SKELAXIN) 800 MG tablet Take 0.5 tablets (400 mg total) by mouth 3 (three) times daily. 07/16/15   Hayden Rasmussen, NP   BP 129/69 mmHg  Pulse 77  Temp(Src) 98.2 F (36.8 C) (Oral)  Resp 16  Ht 5\' 1"  (1.549 m)  Wt 272 lb 1.6 oz (123.424 kg)  BMI 51.44 kg/m2  SpO2 94% Physical Exam  Constitutional: She is oriented to person, place, and time. She appears well-developed and well-nourished. No distress.  HENT:  Head: Normocephalic and atraumatic.  Mouth/Throat: Oropharynx is clear and moist. Mucous membranes are dry.  Eyes: Pupils are equal, round, and reactive to light.  Cardiovascular: Normal rate and regular rhythm.   Pulmonary/Chest: Effort normal and breath sounds normal. No respiratory distress. She has no wheezes. She has no rales.  Cough  Abdominal: Soft. There is no tenderness.  Musculoskeletal: Normal range of motion.  Neurological: She is alert and oriented to person, place, and time.  Skin: Skin is warm and dry.  Psychiatric: She has a normal mood and affect.   ED Course  Procedures (including critical care time) Labs Review Labs Reviewed  I-STAT CHEM 8, ED - Abnormal; Notable for the following:    BUN 35 (*)    Creatinine, Ser 2.50 (*)    Glucose, Bld 121 (*)    Calcium, Ion 0.93 (*)    All other components within normal limits  CBC WITH DIFFERENTIAL/PLATELET    Imaging Review Dg Chest 2 View  07/25/2015   CLINICAL DATA:  Cough for 1 week.  EXAM: CHEST  2 VIEW  COMPARISON:  October 03, 2014  FINDINGS: The heart size and mediastinal contours are stable. There is minimal enlargement of the heart. Patchy consolidation of left lung base is identified. There  is no pulmonary edema or pleural effusion. The visualized skeletal structures are stable.  IMPRESSION: Mild patchy opacity of left lung base, developing pneumonia is not excluded.   Electronically Signed   By: Sherian Rein M.D.   On: 07/25/2015 13:04   I have personally reviewed and evaluated these images and lab results as part of my medical decision-making.   EKG Interpretation None     MDM   Final diagnoses:  Cough  CAP (community acquired pneumonia)   Patient presents to ED for evaluation of dry cough for about a week. CXR with signs of possible developing pneumonia. Vital signs stable. Lab work  with slightly worsening renal function. Gave tessalon for cough. Patient is well appearing.  Will treat patient as CAP. PSI/PORt score put patient in class II, suggesting that trial of outpatient treatment is appropriate. Rx prescribed for Z-pack.    After history, exam, and medical workup I feel the patient has been appropriately medically screened and is safe for discharge home. Pertinent diagnoses were discussed with the patient. Patient was given return precautions.   Caryl Ada, DO 07/25/2015, 4:47 PM PGY-2, [facility]    Pincus Large, DO 07/25/15 1650  Doug Sou, MD 07/25/15 1730

## 2015-07-25 NOTE — ED Provider Notes (Signed)
Complains of nonproductive cough 1 week. Denies shortness of breath denies fever. On exam no distress speaks in paragraphs lungs clear to auscultation. Chest x-ray reviewed by me  Doug Sou, MD 07/25/15 1730

## 2015-07-25 NOTE — ED Notes (Signed)
Had flu like symptoms, fever, body aches, now has cough, dry, hacky cough--

## 2015-07-25 NOTE — Discharge Instructions (Signed)
CXR in ED showed a possible developing pneumonia. Based off of this and your symptoms weill treat you with antibiotics.  Please take medication as instructed. You will take for a course of 5 days.  I also gave you an Rx for cough medicine.  Please rerturn to ED if symptoms worsening, shortness of breath, fever.  Read information below Follow-up with PCP   Pneumonia Pneumonia is an infection of the lungs.  CAUSES Pneumonia may be caused by bacteria or a virus. Usually, these infections are caused by breathing infectious particles into the lungs (respiratory tract). SIGNS AND SYMPTOMS   Cough.  Fever.  Chest pain.  Increased rate of breathing.  Wheezing.  Mucus production. DIAGNOSIS  If you have the common symptoms of pneumonia, your health care provider will typically confirm the diagnosis with a chest X-ray. The X-ray will show an abnormality in the lung (pulmonary infiltrate) if you have pneumonia. Other tests of your blood, urine, or sputum may be done to find the specific cause of your pneumonia. Your health care provider may also do tests (blood gases or pulse oximetry) to see how well your lungs are working. TREATMENT  Some forms of pneumonia may be spread to other people when you cough or sneeze. You may be asked to wear a mask before and during your exam. Pneumonia that is caused by bacteria is treated with antibiotic medicine. Pneumonia that is caused by the influenza virus may be treated with an antiviral medicine. Most other viral infections must run their course. These infections will not respond to antibiotics.  HOME CARE INSTRUCTIONS   Cough suppressants may be used if you are losing too much rest. However, coughing protects you by clearing your lungs. You should avoid using cough suppressants if you can.  Your health care provider may have prescribed medicine if he or she thinks your pneumonia is caused by bacteria or influenza. Finish your medicine even if you start to  feel better.  Your health care provider may also prescribe an expectorant. This loosens the mucus to be coughed up.  Take medicines only as directed by your health care provider.  Do not smoke. Smoking is a common cause of bronchitis and can contribute to pneumonia. If you are a smoker and continue to smoke, your cough may last several weeks after your pneumonia has cleared.  A cold steam vaporizer or humidifier in your room or home may help loosen mucus.  Coughing is often worse at night. Sleeping in a semi-upright position in a recliner or using a couple pillows under your head will help with this.  Get rest as you feel it is needed. Your body will usually let you know when you need to rest. PREVENTION A pneumococcal shot (vaccine) is available to prevent a common bacterial cause of pneumonia. This is usually suggested for:  People over 63 years old.  Patients on chemotherapy.  People with chronic lung problems, such as bronchitis or emphysema.  People with immune system problems. If you are over 65 or have a high risk condition, you may receive the pneumococcal vaccine if you have not received it before. In some countries, a routine influenza vaccine is also recommended. This vaccine can help prevent some cases of pneumonia.You may be offered the influenza vaccine as part of your care. If you smoke, it is time to quit. You may receive instructions on how to stop smoking. Your health care provider can provide medicines and counseling to help you quit. SEEK MEDICAL CARE  IF: You have a fever. SEEK IMMEDIATE MEDICAL CARE IF:   Your illness becomes worse. This is especially true if you are elderly or weakened from any other disease.  You cannot control your cough with suppressants and are losing sleep.  You begin coughing up blood.  You develop pain which is getting worse or is uncontrolled with medicines.  Any of the symptoms which initially brought you in for treatment are  getting worse rather than better.  You develop shortness of breath or chest pain. MAKE SURE YOU:   Understand these instructions.  Will watch your condition.  Will get help right away if you are not doing well or get worse. Document Released: 10/27/2005 Document Revised: 03/13/2014 Document Reviewed: 01/16/2011 [facility] Patient Information 2015 [place], [place]. This information is not intended to replace advice given to you by your health care provider. Make sure you discuss any questions you have with your health care provider.    Cough, Adult  A cough is a reflex. It helps you clear your throat and airways. A cough can help heal your body. A cough can last 2 or 3 weeks (acute) or may last more than 8 weeks (chronic). Some common causes of a cough can include an infection, allergy, or a cold. HOME CARE  Only take medicine as told by your doctor.  If given, take your medicines (antibiotics) as told. Finish them even if you start to feel better.  Use a cold steam vaporizer or humidifier in your home. This can help loosen thick spit (secretions).  Sleep so you are almost sitting up (semi-upright). Use pillows to do this. This helps reduce coughing.  Rest as needed.  Stop smoking if you smoke. GET HELP RIGHT AWAY IF:  You have yellowish-white fluid (pus) in your thick spit.  Your cough gets worse.  Your medicine does not reduce coughing, and you are losing sleep.  You cough up blood.  You have trouble breathing.  Your pain gets worse and medicine does not help.  You have a fever. MAKE SURE YOU:   Understand these instructions.  Will watch your condition.  Will get help right away if you are not doing well or get worse. Document Released: 07/10/2011 Document Revised: 03/13/2014 Document Reviewed: 07/10/2011 [facility] Patient Information 2015 [place], [place]. This information is not intended to replace advice given to you by your health care provider. Make sure you  discuss any questions you have with your health care provider.

## 2015-07-31 ENCOUNTER — Encounter: Payer: Self-pay | Admitting: Pharmacist Clinician (PhC)/ Clinical Pharmacy Specialist

## 2015-07-31 ENCOUNTER — Ambulatory Visit (INDEPENDENT_AMBULATORY_CARE_PROVIDER_SITE_OTHER): Payer: Medicaid Other | Admitting: Pharmacist Clinician (PhC)/ Clinical Pharmacy Specialist

## 2015-07-31 ENCOUNTER — Other Ambulatory Visit (INDEPENDENT_AMBULATORY_CARE_PROVIDER_SITE_OTHER): Payer: Medicaid Other | Admitting: *Deleted

## 2015-07-31 VITALS — BP 152/88 | Ht 61.0 in

## 2015-07-31 DIAGNOSIS — I10 Essential (primary) hypertension: Secondary | ICD-10-CM

## 2015-07-31 DIAGNOSIS — I429 Cardiomyopathy, unspecified: Secondary | ICD-10-CM

## 2015-07-31 DIAGNOSIS — I428 Other cardiomyopathies: Secondary | ICD-10-CM

## 2015-07-31 LAB — BASIC METABOLIC PANEL
BUN: 41 mg/dL — ABNORMAL HIGH (ref 6–23)
CALCIUM: 9.6 mg/dL (ref 8.4–10.5)
CO2: 29 mEq/L (ref 19–32)
CREATININE: 2.36 mg/dL — AB (ref 0.40–1.20)
Chloride: 103 mEq/L (ref 96–112)
GFR: 28.4 mL/min — ABNORMAL LOW (ref >60.00)
Glucose, Bld: 91 mg/dL (ref 70–99)
Potassium: 3.8 mEq/L (ref 3.5–5.1)
Sodium: 140 mEq/L (ref 135–145)

## 2015-07-31 MED ORDER — HYDRALAZINE HCL 10 MG PO TABS: 10.0000 mg | ORAL_TABLET | Freq: Two times a day (BID) | ORAL | Status: DC

## 2015-07-31 NOTE — Progress Notes (Signed)
Cardiology Office Note Date:  07/31/2015   ID:  VERDEAN MURIN, DOB 1966/04/26, MRN [id number]  PCP:  Doris Cheadle, MD  Cardiologist:  Tonny Bollman, MD  No chief complaint on file.    History of Present Illness: Rakia GISSELL BARRA is a 49 y.o. female who presents for follow-up of HTN.  She was seen by Dr. Excell Seltzer on 04/30/15 and her BP in the office was much improved at 132/76.   She has been to the ER twice since her visit with Dr. Excell Seltzer.  The first was for muscle spasms in her back, the second because of congestion and cough.  Cardiac Hx: Pt has a PMH significant for CVA, cardiomyopathy, and CKD stage 3.  There was a note to refer her to nephrology in December but pt has never had an appointment.   Social Hx: quit smoking - last cigarette was 3 weeks ago; drinks alcohol on occasion and caffeine 3-4 days per week.  Now trying to cut back on sodas  Diet:  She is trying to avoid fried foods, eating more baked and grilled meats.  Avoids canned vegetables, eating more frozen instead.  Does not eat much in sweets.  Home BP readings:  Per patient they are all < 140 systolic  Exercise:  Not much recently because of back spasms, but previously was walking regularly.  Encouraged her to start again  Home BP: per patient the home readings, several times each week, are all <140/90  Her current BP medications include hydralazine  daily, isosorbide mononitrate  daily, indapamide 2.5mg  daily, amlodipine  daily, spironolactone  daily and metoprolol 100 mg daily .  Her indapamide was increased and amlodipine added on 4/25.   Her K was low on 4/29 so potassium tablets were added.  She had trouble swallowing these so we stopped that and added spironolactone on 5/6.  She has intolerances to clonidine patch and pill (dry mouth, lethargy, and skin irritation) and ACE inhibitors- anaphylaxis.  She did try amlodipine in the past but this was stopped after her Echo showed an EF of 30-35% and  pt was having extensive lower extremity edema.      BP Readings from Last 3 Encounters:  07/31/15 152/88  07/25/15 132/71  07/16/15 152/90   There were no vitals filed for this visit.    Past Medical History  Diagnosis Date  . Hypertension   . Asthma   . Cardiomyopathy     a. 09/2014 Echo: EF 30-35%, inflat, inf, infsept AK, Gr 2 DD-->No ischemic w/u 2/2 recent CVA and body habitus.  . CVA (cerebral infarction)     a. Acute sub cm LEFT basal ganglia/internal capsule lacunar infarct, normal MRA.  . Morbid obesity   . Tobacco abuse   . CKD (chronic kidney disease), stage III       Current Outpatient Prescriptions  Medication Sig Dispense Refill  . albuterol (PROVENTIL HFA;VENTOLIN HFA) 108 (90 BASE) MCG/ACT inhaler Inhale 2 puffs into the lungs every 6 (six) hours as needed for wheezing or shortness of breath. 1 Inhaler 1  . amLODipine (NORVASC) 10 MG tablet Take 1 tablet (10 mg total) by mouth daily. 90 tablet 3  . azithromycin (ZITHROMAX Z-PAK) 250 MG tablet Take (2 tab) on day 1 followed by four days of 250 mg(1 tab) a day (Patient not taking: Reported on 07/31/2015) 6 each 0  . benzonatate (TESSALON) 200 MG capsule Take 1 capsule (200 mg total) by mouth 3 (three) times daily as needed  for cough. (Patient not taking: Reported on 07/31/2015) 20 capsule 0  . clopidogrel (PLAVIX) 75 MG tablet Take 1 tablet (75 mg total) by mouth daily with breakfast. 30 tablet 2  . clotrimazole (LOTRIMIN) 1 % cream Apply 1 application topically 2 (two) times daily. 30 g 0  . diclofenac sodium (VOLTAREN) 1 % GEL Apply 1 application topically 4 (four) times daily. 100 g 0  . hydrALAZINE (APRESOLINE) 10 MG tablet Take 1 tablet (10 mg total) by mouth 3 (three) times daily. (Patient taking differently: Take 10 mg by mouth daily. ) 270 tablet 3  . indapamide (LOZOL) 2.5 MG tablet Take 1 tablet (2.5 mg total) by mouth daily. 90 tablet 3  . isosorbide mononitrate (IMDUR) 30 MG 24 hr tablet Take 1  tablet (30 mg total) by mouth daily. 90 tablet 3  . metaxalone (SKELAXIN) 800 MG tablet Take 0.5 tablets (400 mg total) by mouth 3 (three) times daily. 15 tablet 0  . metoprolol succinate (TOPROL-XL) 100 MG 24 hr tablet Take 1 tablet (100 mg total) by mouth 2 (two) times daily. Take with or immediately following a meal. 60 tablet 5  . potassium chloride SA (K-DUR,KLOR-CON) 20 MEQ tablet Take 1 tablet (20 mEq total) by mouth daily. 90 tablet 3  . spironolactone (ALDACTONE) 25 MG tablet Take 1 tablet (25 mg total) by mouth daily. 90 tablet 3  . [DISCONTINUED] simvastatin (ZOCOR) 20 MG tablet Take 1 tablet (20 mg total) by mouth at bedtime. (Patient not taking: Reported on 04/30/2015) 30 tablet 4   No current facility-administered medications for this visit.     Allergies:   Altace and Aspirin    Phillips Hay PharmD CPP [facility]

## 2015-07-31 NOTE — Patient Instructions (Signed)
Return for a a follow up appointment in 3 months  Your blood pressure today is 152/88   Check your blood pressure at home several times each week and keep record of the readings.  Take your BP meds as follows: increase HYDRALAZINE TO TWICE DAILY; move the following medications to bedtime - simvastatin and metoprolol  Bring all of your meds, your BP cuff and your record of home blood pressures to your next appointment.  Exercise as you're able, try to walk approximately 30 minutes per day.  Keep salt intake to a minimum, especially watch canned and prepared boxed foods.  Eat more fresh fruits and vegetables and fewer canned items.  Avoid eating in fast food restaurants.    HOW TO TAKE YOUR BLOOD PRESSURE: . Rest 5 minutes before taking your blood pressure. .  Don't smoke or drink caffeinated beverages for at least 30 minutes before. . Take your blood pressure before (not after) you eat. . Sit comfortably with your back supported and both feet on the floor (don't cross your legs). . Elevate your arm to heart level on a table or a desk. . Use the proper sized cuff. It should fit smoothly and snugly around your bare upper arm. There should be enough room to slip a fingertip under the cuff. The bottom edge of the cuff should be 1 inch above the crease of the elbow. . Ideally, take 3 measurements at one sitting and record the average.

## 2015-07-31 NOTE — Addendum Note (Signed)
Addended by: Tonita Phoenix on: 07/31/2015 09:48 AM   Modules accepted: Orders

## 2015-07-31 NOTE — Assessment & Plan Note (Signed)
Today her pressure remains elevated, despite her having good home readings.  Of note, she only takes her hydralazine 10 mg once daily, in the mornings, along with all of her medications.  I have asked her to move 3-4 medications to nighttime.   She will increase the hydralazine to 10 mg twice daily, and take the evening dose with her metoprolol and simvastatin.  I have asked that she do this on a routine, before her nightly shower.  We will see her back in 3 months for follow up.

## 2015-08-28 ENCOUNTER — Telehealth: Payer: Self-pay

## 2015-08-28 MED ORDER — PANTOPRAZOLE SODIUM 40 MG PO TBEC: 40.0000 mg | DELAYED_RELEASE_TABLET | Freq: Every day | ORAL | Status: DC

## 2015-08-28 NOTE — Telephone Encounter (Signed)
Pt aware and Rx sent to the pharmacy.

## 2015-08-28 NOTE — Telephone Encounter (Signed)
Ok to give her a trial of protonix 40 mg daily. thx

## 2015-08-28 NOTE — Telephone Encounter (Signed)
I spoke with the pt and she complained of acid reflux the past 3 nights and wondered if her medications were causing symptoms.  The pt can feel and taste the acid running up in her throat.  She has not changed her diet in the past 3 days and she use to take Nexium in the past.  The pt would like to know if we can send in a Rx for reflux. The pt does take plavix.  I made the pt aware that I will forward this information to Dr Excell Seltzer to review and make further recommendations.

## 2015-10-16 ENCOUNTER — Ambulatory Visit: Payer: Medicaid Other | Admitting: Pharmacist

## 2015-10-28 NOTE — Progress Notes (Signed)
Cardiology Office Note    Date:  10/28/2015   ID:  Andrea Clements, DOB 07-06-1966, MRN [id number]  PCP:  Doris Cheadle, MD  Cardiologist:  Dr. Tonny Bollman   Electrophysiologist:  n/a  Chief Complaint  Patient presents with  . Follow-up    HTN  . Cardiomyopathy    History of Present Illness:  Andrea Clements is a 49 y.o. female     Past Medical History  Diagnosis Date  . Hypertension   . Asthma   . Cardiomyopathy (HCC)     a. 09/2014 Echo: EF 30-35%, inflat, inf, infsept AK, Gr 2 DD-->No ischemic w/u 2/2 recent CVA and body habitus.  . CVA (cerebral infarction)     a. Acute sub cm LEFT basal ganglia/internal capsule lacunar infarct, normal MRA.  . Morbid obesity (HCC)   . Tobacco abuse   . CKD (chronic kidney disease), stage III     Past Surgical History  Procedure Laterality Date  . Abdominal hysterectomy    . Cesarean section      Current Outpatient Prescriptions  Medication Sig Dispense Refill  . albuterol (PROVENTIL HFA;VENTOLIN HFA) 108 (90 BASE) MCG/ACT inhaler Inhale 2 puffs into the lungs every 6 (six) hours as needed for wheezing or shortness of breath. 1 Inhaler 1  . amLODipine (NORVASC) 10 MG tablet Take 1 tablet (10 mg total) by mouth daily. 90 tablet 3  . azithromycin (ZITHROMAX Z-PAK) 250 MG tablet Take (2 tab) on day 1 followed by four days of 250 mg(1 tab) a day (Patient not taking: Reported on 07/31/2015) 6 each 0  . benzonatate (TESSALON) 200 MG capsule Take 1 capsule (200 mg total) by mouth 3 (three) times daily as needed for cough. (Patient not taking: Reported on 07/31/2015) 20 capsule 0  . clopidogrel (PLAVIX) 75 MG tablet Take 1 tablet (75 mg total) by mouth daily with breakfast. 30 tablet 2  . clotrimazole (LOTRIMIN) 1 % cream Apply 1 application topically 2 (two) times daily. 30 g 0  . diclofenac sodium (VOLTAREN) 1 % GEL Apply 1 application topically 4 (four) times daily. 100 g 0  . hydrALAZINE (APRESOLINE) 10 MG tablet Take 1  tablet (10 mg total) by mouth 2 (two) times daily. 60 tablet 3  . indapamide (LOZOL) 2.5 MG tablet Take 1 tablet (2.5 mg total) by mouth daily. 90 tablet 3  . isosorbide mononitrate (IMDUR) 30 MG 24 hr tablet Take 1 tablet (30 mg total) by mouth daily. 90 tablet 3  . metaxalone (SKELAXIN) 800 MG tablet Take 0.5 tablets (400 mg total) by mouth 3 (three) times daily. 15 tablet 0  . metoprolol succinate (TOPROL-XL) 100 MG 24 hr tablet Take 1 tablet (100 mg total) by mouth 2 (two) times daily. Take with or immediately following a meal. 60 tablet 5  . pantoprazole (PROTONIX) 40 MG tablet Take 1 tablet (40 mg total) by mouth daily. 30 tablet 3  . potassium chloride SA (K-DUR,KLOR-CON) 20 MEQ tablet Take 1 tablet (20 mEq total) by mouth daily. 90 tablet 3  . spironolactone (ALDACTONE) 25 MG tablet Take 1 tablet (25 mg total) by mouth daily. 90 tablet 3  . [DISCONTINUED] simvastatin (ZOCOR) 20 MG tablet Take 1 tablet (20 mg total) by mouth at bedtime. (Patient not taking: Reported on 04/30/2015) 30 tablet 4   No current facility-administered medications for this visit.    Allergies:   Altace and Aspirin   Social History   Social History  . Marital Status: Single  Spouse Name: N/A  . Number of Children: N/A  . Years of Education: N/A   Social History Main Topics  . Smoking status: Former Smoker -- 0.50 packs/day for 30 years    Types: Cigarettes    Quit date: 07/10/2015  . Smokeless tobacco: Not on file  . Alcohol Use: No     Comment: occ  . Drug Use: No  . Sexual Activity: Not on file   Other Topics Concern  . Not on file   Social History Narrative     Family History:  The patient's family history includes Cancer in her mother; Hypertension in her father, maternal grandfather, maternal grandmother, mother, paternal grandfather, and paternal grandmother.   ROS:   Please see the history of present illness.    ROS All other systems reviewed and are negative.   PHYSICAL EXAM:     VS:  There were no vitals taken for this visit.   GEN: Well nourished, well developed, in no acute distress HEENT: normal Neck: no JVD, carotid bruits, or masses Cardiac: RRR; no murmurs, rubs, or gallops,no edema  Respiratory:  clear to auscultation bilaterally, normal work of breathing GI: soft, nontender, nondistended, + BS MS: no deformity or atrophy Skin: warm and dry, no rash Neuro:  Alert and Oriented x 3, Strength and sensation are intact Psych: euthymic mood, full affect  Wt Readings from Last 3 Encounters:  07/25/15 272 lb 1.6 oz (123.424 kg)  07/06/15 275 lb 9.6 oz (125.011 kg)  04/30/15 274 lb (124.286 kg)      Studies/Labs Reviewed:   EKG:  EKG is ordered today.  The ekg ordered today demonstrates   Recent Labs: 07/25/2015: Hemoglobin 13.3; Platelets 332 07/31/2015: BUN 41*; Creatinine, Ser 2.36*; Potassium 3.8; Sodium 140   Lipid Panel    Component Value Date/Time   CHOL 198 10/03/2014 0730   TRIG 180* 10/03/2014 0730   HDL 35* 10/03/2014 0730   CHOLHDL 5.7 10/03/2014 0730   VLDL 36 10/03/2014 0730   LDLCALC 127* 10/03/2014 0730    Additional studies/ records that were reviewed today include:  Echo 01/16/15 - Left ventricle: The cavity size was normal. There was moderate concentric hypertrophy. Systolic function was moderately to severely reduced. The estimated ejection fraction was in the range of 30% to 35%. Wall motion was normal; there were no regional wall motion abnormalities. Features are consistent with a pseudonormal left ventricular filling pattern, with concomitant abnormal relaxation and increased filling pressure (grade 2 diastolic dysfunction). Doppler parameters are consistent with elevated ventricular end-diastolic filling pressure. - Aortic valve: Trileaflet; normal thickness leaflets. Transvalvular velocity was within the normal range. There was no stenosis. There was no regurgitation. - Aortic root: The aortic root  was normal in size. - Mitral valve: Mildly thickened leaflets . - Left atrium: The atrium was moderately dilated. - Right ventricle: Systolic function was normal. - Right atrium: The atrium was normal in size. - Tricuspid valve: There was mild regurgitation. - Pulmonic valve: There was no regurgitation. - Pulmonary arteries: Systolic pressure was within the normal range. - Inferior vena cava: The vessel was normal in size. - Pericardium, extracardiac: There was no pericardial effusion. Impressions:  - There is no significant difference when compared to the studyfrom 10/03/2014.  Carotid US 10/03/14 Findings suggest 1-39% internal carotid artery stenosis Bilaterally      ASSESSMENT:    No diagnosis found.   PLAN:  In order of problems listed above:    Medication Adjustments/Labs and Tests Ordered: Current  medicines are reviewed at length with the patient today.  Concerns regarding medicines are outlined above.  Medication changes, Labs and Tests ordered today are listed below. There are no Patient Instructions on file for this visit.    Signed, Tereso Newcomer, PA-C  10/28/2015 10:32 PM    [facility] [address] [place], [place], [place]  [address] Phone: [phone]; Fax: [phone]  This encounter was created in error - please disregard.

## 2015-10-29 ENCOUNTER — Other Ambulatory Visit: Payer: Self-pay

## 2015-10-29 ENCOUNTER — Ambulatory Visit (HOSPITAL_COMMUNITY): Payer: Medicaid Other | Attending: Cardiovascular Disease

## 2015-10-29 ENCOUNTER — Encounter: Payer: Medicaid Other | Admitting: Physician Assistant

## 2015-10-29 DIAGNOSIS — N189 Chronic kidney disease, unspecified: Secondary | ICD-10-CM | POA: Diagnosis not present

## 2015-10-29 DIAGNOSIS — Z6841 Body Mass Index (BMI) 40.0 and over, adult: Secondary | ICD-10-CM | POA: Insufficient documentation

## 2015-10-29 DIAGNOSIS — I129 Hypertensive chronic kidney disease with stage 1 through stage 4 chronic kidney disease, or unspecified chronic kidney disease: Secondary | ICD-10-CM | POA: Diagnosis not present

## 2015-10-29 DIAGNOSIS — F17200 Nicotine dependence, unspecified, uncomplicated: Secondary | ICD-10-CM | POA: Insufficient documentation

## 2015-10-29 DIAGNOSIS — I351 Nonrheumatic aortic (valve) insufficiency: Secondary | ICD-10-CM | POA: Diagnosis not present

## 2015-10-29 DIAGNOSIS — I517 Cardiomegaly: Secondary | ICD-10-CM | POA: Insufficient documentation

## 2015-10-29 DIAGNOSIS — I429 Cardiomyopathy, unspecified: Secondary | ICD-10-CM | POA: Diagnosis not present

## 2015-10-29 DIAGNOSIS — I340 Nonrheumatic mitral (valve) insufficiency: Secondary | ICD-10-CM | POA: Insufficient documentation

## 2015-10-29 DIAGNOSIS — I428 Other cardiomyopathies: Secondary | ICD-10-CM

## 2015-10-29 DIAGNOSIS — I10 Essential (primary) hypertension: Secondary | ICD-10-CM

## 2015-10-31 ENCOUNTER — Encounter: Payer: Self-pay | Admitting: Physician Assistant

## 2015-10-31 ENCOUNTER — Ambulatory Visit (INDEPENDENT_AMBULATORY_CARE_PROVIDER_SITE_OTHER): Payer: Medicaid Other | Admitting: Physician Assistant

## 2015-10-31 VITALS — BP 142/102 | HR 80 | Ht 61.0 in | Wt 283.0 lb

## 2015-10-31 DIAGNOSIS — I420 Dilated cardiomyopathy: Secondary | ICD-10-CM

## 2015-10-31 DIAGNOSIS — I110 Hypertensive heart disease with heart failure: Secondary | ICD-10-CM | POA: Diagnosis not present

## 2015-10-31 DIAGNOSIS — N183 Chronic kidney disease, stage 3 unspecified: Secondary | ICD-10-CM

## 2015-10-31 DIAGNOSIS — Z720 Tobacco use: Secondary | ICD-10-CM

## 2015-10-31 DIAGNOSIS — I429 Cardiomyopathy, unspecified: Secondary | ICD-10-CM

## 2015-10-31 DIAGNOSIS — R0683 Snoring: Secondary | ICD-10-CM

## 2015-10-31 DIAGNOSIS — I6389 Other cerebral infarction: Secondary | ICD-10-CM

## 2015-10-31 DIAGNOSIS — Z8673 Personal history of transient ischemic attack (TIA), and cerebral infarction without residual deficits: Secondary | ICD-10-CM

## 2015-10-31 DIAGNOSIS — I638 Other cerebral infarction: Secondary | ICD-10-CM

## 2015-10-31 LAB — BASIC METABOLIC PANEL
BUN: 37 mg/dL — ABNORMAL HIGH (ref 7–25)
CHLORIDE: 103 mmol/L (ref 98–110)
CO2: 25 mmol/L (ref 20–31)
Calcium: 9.5 mg/dL (ref 8.6–10.2)
Creat: 2.44 mg/dL — ABNORMAL HIGH (ref 0.50–1.10)
Glucose, Bld: 84 mg/dL (ref 65–99)
POTASSIUM: 3.8 mmol/L (ref 3.5–5.3)
Sodium: 138 mmol/L (ref 135–146)

## 2015-10-31 MED ORDER — HYDRALAZINE HCL 25 MG PO TABS: 25.0000 mg | ORAL_TABLET | Freq: Three times a day (TID) | ORAL | Status: DC

## 2015-10-31 MED ORDER — ISOSORBIDE MONONITRATE ER 30 MG PO TB24: 30.0000 mg | ORAL_TABLET | Freq: Every day | ORAL | Status: DC

## 2015-10-31 MED ORDER — CLOPIDOGREL BISULFATE 75 MG PO TABS: 75.0000 mg | ORAL_TABLET | Freq: Every day | ORAL | Status: DC

## 2015-10-31 MED ORDER — METOPROLOL SUCCINATE ER 100 MG PO TB24: 100.0000 mg | ORAL_TABLET | Freq: Every day | ORAL | Status: DC

## 2015-10-31 NOTE — Progress Notes (Signed)
Cardiology Office Note    Date:  10/31/2015   ID:  Andrea Clements, DOB 1966/07/14, MRN [id number]  PCP:  Doris Cheadle, MD  Cardiologist:  Dr. Tonny Bollman   Electrophysiologist:  n/a  Chief Complaint  Patient presents with  . Follow-up  . Cardiomyopathy    History of Present Illness:  Andrea Clements is a 49 y.o. female with a hx of Cardiomyopathy. Patient presented in 2015 with an acute stroke involving the left basal ganglia. Echocardiogram demonstrated inferior wall motion abnormality and an EF of 30-35%. Conservative management was recommended in the setting of kidney disease, obesity and recent CVA. She has had difficulty with malignant hypertension and has been followed closely in our hypertension clinic. Last seen by Dr. Excell Seltzer 6/16.follow-up echocardiogram 10/29/15 demonstrated improved LV function with an EF of 50-55%, moderate LVH and moderate diastolic dysfunction  She has been followed in the hypertension clinic and was last seen in 9/16. Information regarding her medications is as follows: "Her current BP medications include hydralazine 10mg  daily, isosorbide mononitrate 30mg  daily, indapamide 2.5mg  daily, amlodipine 10mg  daily, spironolactone 25mg  daily and metoprolol 100 mg daily . Her indapamide was increased and amlodipine added on 4/25. Her K was low on 4/29 so potassium tablets were added. She had trouble swallowing these so we stopped that and added spironolactone on 5/6. She has intolerances to clonidine patch and pill (dry mouth, lethargy, and skin irritation) and ACE inhibitors- anaphylaxis. She did try amlodipine in the past but this was stopped after her Echo showed an EF of 30-35% and pt was having extensive lower extremity edema." Hydralazine was changed to 10 mg twice a day at this visit.   Returns for FU.  She is overall doing well. She denies chest pain or significant dyspnea. She denies orthopnea, PND or edema. She denies syncope or near  syncope.   Past Medical History  Diagnosis Date  . Hypertension   . Asthma   . Cardiomyopathy (HCC)     a. 09/2014 Echo: EF 30-35%, inflat, inf, infsept AK, Gr 2 DD-->No ischemic w/u 2/2 recent CVA and body habitus.  . CVA (cerebral infarction)     a. Acute sub cm LEFT basal ganglia/internal capsule lacunar infarct, normal MRA.  . Morbid obesity (HCC)   . Tobacco abuse   . CKD (chronic kidney disease), stage III     Past Surgical History  Procedure Laterality Date  . Abdominal hysterectomy    . Cesarean section      Current Outpatient Prescriptions  Medication Sig Dispense Refill  . albuterol (PROVENTIL HFA;VENTOLIN HFA) 108 (90 BASE) MCG/ACT inhaler Inhale 2 puffs into the lungs every 6 (six) hours as needed for wheezing or shortness of breath. 1 Inhaler 1  . amLODipine (NORVASC) 10 MG tablet Take 1 tablet (10 mg total) by mouth daily. 90 tablet 3  . clopidogrel (PLAVIX) 75 MG tablet Take 1 tablet (75 mg total) by mouth daily with breakfast. 90 tablet 3  . clotrimazole (LOTRIMIN) 1 % cream Apply 1 application topically 2 (two) times daily. 30 g 0  . hydrALAZINE (APRESOLINE) 25 MG tablet Take 1 tablet (25 mg total) by mouth 3 (three) times daily. 90 tablet 3  . indapamide (LOZOL) 2.5 MG tablet Take 1 tablet (2.5 mg total) by mouth daily. 90 tablet 3  . isosorbide mononitrate (IMDUR) 30 MG 24 hr tablet Take 1 tablet (30 mg total) by mouth daily. 90 tablet 3  . metoprolol succinate (TOPROL-XL) 100 MG 24 hr  tablet Take 1 tablet (100 mg total) by mouth daily. Take with or immediately following a meal. 90 tablet 3  . pantoprazole (PROTONIX) 40 MG tablet Take 1 tablet (40 mg total) by mouth daily. 30 tablet 3  . potassium chloride SA (K-DUR,KLOR-CON) 20 MEQ tablet Take 1 tablet (20 mEq total) by mouth daily. 90 tablet 3  . spironolactone (ALDACTONE) 25 MG tablet Take 1 tablet (25 mg total) by mouth daily. 90 tablet 3  . [DISCONTINUED] simvastatin (ZOCOR) 20 MG tablet Take 1 tablet (20 mg  total) by mouth at bedtime. (Patient not taking: Reported on 04/30/2015) 30 tablet 4   No current facility-administered medications for this visit.    Allergies:   Altace and Aspirin   Social History   Social History  . Marital Status: Single    Spouse Name: N/A  . Number of Children: N/A  . Years of Education: N/A   Social History Main Topics  . Smoking status: Former Smoker -- 0.50 packs/day for 30 years    Types: Cigarettes    Quit date: 07/10/2015  . Smokeless tobacco: None  . Alcohol Use: No     Comment: occ  . Drug Use: No  . Sexual Activity: Not Asked   Other Topics Concern  . None   Social History Narrative     Family History:  The patient's family history includes Cancer in her mother; Hypertension in her father, maternal grandfather, maternal grandmother, mother, paternal grandfather, and paternal grandmother.   ROS:   Please see the history of present illness.    ROS All other systems reviewed and are negative.   PHYSICAL EXAM:   VS:  BP 142/102 mmHg  Pulse 80  Ht  (1.549 m)  Wt 283 lb (128.368 kg)  BMI 53.50 kg/m2   GEN: Well nourished, well developed, in no acute distress HEENT: normal Neck: no JVD, no masses Cardiac: Normal S1/S2, RRR; no murmurs, rubs, or gallops, no edema  Respiratory:  clear to auscultation bilaterally; no wheezing, rhonchi or rales GI: soft, nontender, nondistended, + BS MS: no deformity or atrophy Skin: warm and dry, no rash Neuro:  Bilateral strength equal, no focal deficits  Psych: Alert and oriented x 3, normal affect  Wt Readings from Last 3 Encounters:  10/31/15 283 lb (128.368 kg)  07/25/15 272 lb 1.6 oz (123.424 kg)  07/06/15 275 lb 9.6 oz (125.011 kg)     Studies/Labs Reviewed:   EKG:  EKG is ordered today.  The ekg ordered today demonstrates NSR, HR 80, normal axis, T-wave inversions in 1, 2, aVF, V4-V6  Recent Labs: 07/25/2015: Hemoglobin 13.3; Platelets 332 07/31/2015: BUN 41*; Creatinine, Ser 2.36*;  Potassium 3.8; Sodium 140   Recent Lipid Panel    Component Value Date/Time   CHOL 198 10/03/2014 0730   TRIG 180* 10/03/2014 0730   HDL 35* 10/03/2014 0730   CHOLHDL 5.7 10/03/2014 0730   VLDL 36 10/03/2014 0730   LDLCALC 127* 10/03/2014 0730    Additional studies/ records that were reviewed today include:   Echo 10/29/15 Moderate LVH of the posterior wall, EF 50-55%, diffuse HK, grade 2 diastolic dysfunction, mild AI, trivial MR, mild LAE, normal RV function, increased LA pressure  Echo 01/16/15 Moderate LVH, EF 30-35%, normal wall motion, grade 2 diastolic dysfunction, moderate LAE, mild TR  Carotid US 10/03/14 Bilateral ICA 1-39%    ASSESSMENT:    1. Hypertensive heart disease with heart failure (HCC)   2. Dilated cardiomyopathy (HCC)   3.  CKD (chronic kidney disease), stage III   4. History of CVA (cerebrovascular accident)   5. Tobacco abuse   6. Snoring   7. Cardiomyopathy (HCC)   8. Cerebral infarction due to other mechanism [facility])     PLAN:  In order of problems listed above:  1. Hypertensive heart disease - Her blood pressure remains elevated. She did have an increased salt load today (2 strips of bacon). However, her blood pressures at home have been elevated as well. I will increase her hydralazine to 25 mg 3 times a day. Continue current dose of amlodipine, indapamide, isosorbide, metoprolol succinate, spironolactone. She does have new T-wave inversions on her ECG. Question if this is related to uncontrolled blood pressure. She denies any symptoms of angina. Will repeat ECG at follow-up appointment.  2. Dilated cardiomyopathy - Likely related to uncontrolled hypertension. Recent echocardiogram demonstrates normal LV function. She is NYHA 2. She does not have any significant findings for volume excess on exam. I have recommended that she take an extra dose of indapamide on days that she feels more swollen and documents a 2-3 pound weight gain.  3. CKD - Repeat  BMET today.  4. History of CVA - She denies significant residual deficits. Continue Plavix.  5. Tobacco Abuse - We discussed the importance of tobacco cessation.  6. Snoring - Also notes fatigue.  With hard to control BP, will set up split night sleep study.      Medication Adjustments/Labs and Tests Ordered: Current medicines are reviewed at length with the patient today.  Concerns regarding medicines are outlined above.  Medication changes, Labs and Tests ordered today are listed below. Patient Instructions  Medication Instructions:  1. INCREASE HYDRALAZINE TO 25 MG THREE TIMES DAILY  2. REFILLS SENT IN FOR PLAVIX, IMDUR, TOPROL XL  Labwork: TODAY BMET  Testing/Procedures: Your physician has recommended that you have a SPLIT NIGHT sleep study. This test records several body functions during sleep, including: brain activity, eye movement, oxygen and carbon dioxide blood levels, heart rate and rhythm, breathing rate and rhythm, the flow of air through your mouth and nose, snoring, body muscle movements, and chest and belly movement.   Follow-Up: Andrea Clements, PAC 4-6 WEEKS  Any Other Special Instructions Will Be Listed Below (If Applicable).   If you need a refill on your cardiac medications before your next appointment, please call your pharmacy.        Signed, Tereso Newcomer, PA-C  10/31/2015 4:10 PM    [facility] [address] [place], [place], [place]  [address] Phone: [phone]; Fax: [phone]

## 2015-10-31 NOTE — Patient Instructions (Signed)
Medication Instructions:  1. INCREASE HYDRALAZINE TO 25 MG THREE TIMES DAILY  2. REFILLS SENT IN FOR PLAVIX, IMDUR, TOPROL XL  Labwork: TODAY BMET  Testing/Procedures: Your physician has recommended that you have a SPLIT NIGHT sleep study. This test records several body functions during sleep, including: brain activity, eye movement, oxygen and carbon dioxide blood levels, heart rate and rhythm, breathing rate and rhythm, the flow of air through your mouth and nose, snoring, body muscle movements, and chest and belly movement.   Follow-Up: SCOTT WEAVER, PAC 4-6 WEEKS  Any Other Special Instructions Will Be Listed Below (If Applicable).   If you need a refill on your cardiac medications before your next appointment, please call your pharmacy.

## 2015-11-02 ENCOUNTER — Telehealth: Payer: Self-pay | Admitting: *Deleted

## 2015-11-02 NOTE — Telephone Encounter (Signed)
Pt notified of lab results by phone with verbal understanding today

## 2015-12-04 NOTE — Progress Notes (Signed)
Cardiology Office Note:    Date:  12/05/2015   ID:  Andrea Clements, DOB 1966/06/30, MRN [id number]  PCP:  Doris Cheadle, MD  Cardiologist:  Dr. Tonny Bollman   Electrophysiologist:  n/a  Chief Complaint  Patient presents with  . Hypertension    Follow up    History of Present Illness:    Andrea Clements is a 50 y.o. female with a hx of Cardiomyopathy. Patient presented in 2015 with an acute stroke involving the left basal ganglia. Echocardiogram demonstrated inferior wall motion abnormality and an EF of 30-35%. Conservative management was recommended in the setting of kidney disease, obesity and recent CVA. She has had difficulty with malignant hypertension and has been followed closely in our hypertension clinic. FU echocardiogram 10/29/15 demonstrated improved LV function with an EF of 50-55%, moderate LVH and moderate diastolic dysfunction  She has been followed in the hypertension clinic and was last seen in 9/16. Information regarding her medications is as follows: "Her current BP medications include hydralazine  daily, isosorbide mononitrate  daily, indapamide 2.5mg  daily, amlodipine  daily, spironolactone  daily and metoprolol 100 mg daily . Her indapamide was increased and amlodipine added on 4/25. Her K was low on 4/29 so potassium tablets were added. She had trouble swallowing these so we stopped that and added spironolactone on 5/6. She has intolerances to clonidine patch and pill (dry mouth, lethargy, and skin irritation) and ACE inhibitors- anaphylaxis. She did try amlodipine in the past but this was stopped after her Echo showed an EF of 30-35% and pt was having extensive lower extremity edema." Hydralazine was changed to 10 mg twice a day at this visit.   Last seen by me 10/31/15.  Hydralazine dose was adjusted. She returns for follow-up.   Past Medical History  Diagnosis Date  . Hypertension   . Asthma   . Cardiomyopathy (HCC)     a.  09/2014 Echo: EF 30-35%, inflat, inf, infsept AK, Gr 2 DD-->No ischemic w/u 2/2 recent CVA and body habitus.  . CVA (cerebral infarction)     a. Acute sub cm LEFT basal ganglia/internal capsule lacunar infarct, normal MRA.  . Morbid obesity (HCC)   . Tobacco abuse   . CKD (chronic kidney disease), stage III     Past Surgical History  Procedure Laterality Date  . Abdominal hysterectomy    . Cesarean section      Current Medications: Outpatient Prescriptions Prior to Visit  Medication Sig Dispense Refill  . albuterol (PROVENTIL HFA;VENTOLIN HFA) 108 (90 BASE) MCG/ACT inhaler Inhale 2 puffs into the lungs every 6 (six) hours as needed for wheezing or shortness of breath. 1 Inhaler 1  . amLODipine (NORVASC) 10 MG tablet Take 1 tablet (10 mg total) by mouth daily. 90 tablet 3  . clopidogrel (PLAVIX) 75 MG tablet Take 1 tablet (75 mg total) by mouth daily with breakfast. 90 tablet 3  . clotrimazole (LOTRIMIN) 1 % cream Apply 1 application topically 2 (two) times daily. 30 g 0  . hydrALAZINE (APRESOLINE) 25 MG tablet Take 1 tablet (25 mg total) by mouth 3 (three) times daily. 90 tablet 3  . indapamide (LOZOL) 2.5 MG tablet Take 1 tablet (2.5 mg total) by mouth daily. 90 tablet 3  . isosorbide mononitrate (IMDUR) 30 MG 24 hr tablet Take 1 tablet (30 mg total) by mouth daily. 90 tablet 3  . metoprolol succinate (TOPROL-XL) 100 MG 24 hr tablet Take 1 tablet (100 mg total) by mouth daily. Take  with or immediately following a meal. 90 tablet 3  . pantoprazole (PROTONIX) 40 MG tablet Take 1 tablet (40 mg total) by mouth daily. 30 tablet 3  . potassium chloride SA (K-DUR,KLOR-CON) 20 MEQ tablet Take 1 tablet (20 mEq total) by mouth daily. 90 tablet 3  . spironolactone (ALDACTONE) 25 MG tablet Take 1 tablet (25 mg total) by mouth daily. 90 tablet 3   No facility-administered medications prior to visit.     Allergies:   Altace and Aspirin   Social History   Social History  . Marital Status:  Single    Spouse Name: N/A  . Number of Children: N/A  . Years of Education: N/A   Social History Main Topics  . Smoking status: Former Smoker -- 0.50 packs/day for 30 years    Types: Cigarettes    Quit date: 07/10/2015  . Smokeless tobacco: Not on file  . Alcohol Use: No     Comment: occ  . Drug Use: No  . Sexual Activity: Not on file   Other Topics Concern  . Not on file   Social History Narrative     Family History:  The patient's family history includes Cancer in her mother; Hypertension in her father, maternal grandfather, maternal grandmother, mother, paternal grandfather, and paternal grandmother.   ROS:   Please see the history of present illness.    ROS All other systems reviewed and are negative.   Physical Exam:    VS:  There were no vitals taken for this visit.   GEN: Well nourished, well developed, in no acute distress HEENT: normal Neck: no JVD, no masses Cardiac: Normal S1/S2, RRR; no murmurs, rubs, or gallops, no edema;   carotid bruits,   Respiratory:  clear to auscultation bilaterally; no wheezing, rhonchi or rales GI: soft, nontender, nondistended, + BS MS: no deformity or atrophy Skin: warm and dry, no rash Neuro:  Bilateral strength equal, no focal deficits  Psych: Alert and oriented x 3, normal affect  Wt Readings from Last 3 Encounters:  10/31/15 283 lb (128.368 kg)  07/25/15 272 lb 1.6 oz (123.424 kg)  07/06/15 275 lb 9.6 oz (125.011 kg)      Studies/Labs Reviewed:    EKG:  EKG is  ordered today.  The ekg ordered today demonstrates   Recent Labs: 07/25/2015: Hemoglobin 13.3; Platelets 332 10/31/2015: BUN 37*; Creat 2.44*; Potassium 3.8; Sodium 138   Recent Lipid Panel    Component Value Date/Time   CHOL 198 10/03/2014 0730   TRIG 180* 10/03/2014 0730   HDL 35* 10/03/2014 0730   CHOLHDL 5.7 10/03/2014 0730   VLDL 36 10/03/2014 0730   LDLCALC 127* 10/03/2014 0730    Additional studies/ records that were reviewed today include:     Echo 10/29/15 Moderate LVH of the posterior wall, EF 50-55%, diffuse HK, grade 2 diastolic dysfunction, mild AI, trivial MR, mild LAE, normal RV function, increased LA pressure  Echo 01/16/15 Moderate LVH, EF 30-35%, normal wall motion, grade 2 diastolic dysfunction, moderate LAE, mild TR  Carotid US 10/03/14 Bilateral ICA 1-39%    ASSESSMENT:    1. Hypertensive heart disease with heart failure (HCC)   2. Dilated cardiomyopathy (HCC)   3. CKD (chronic kidney disease), stage III   4. History of CVA (cerebrovascular accident)   5. Tobacco abuse   6. Snoring     PLAN:    In order of problems listed above:  1. Hypertensive heart disease -  Continue hydralazine, amlodipine, indapamide, isosorbide,  metoprolol succinate, spironolactone.   2. Dilated cardiomyopathy - Likely related to uncontrolled hypertension. Recent echocardiogram demonstrates normal LV function. She is NYHA 2. She does not have any significant findings for volume excess on exam.  3. CKD -  Recent creatinine stable.  4. History of CVA - She denies significant residual deficits. Continue Plavix.  5. Tobacco Abuse - We discussed the importance of tobacco cessation.  6. Snoring - Split night sleep study is pending.      Medication Adjustments/Labs and Tests Ordered: Current medicines are reviewed at length with the patient today.  Concerns regarding medicines are outlined above.  Medication changes, Labs and Tests ordered today are outlined in the Patient Instructions noted below. There are no Patient Instructions on file for this visit.   Signed, Tereso Newcomer, PA-C  12/05/2015 8:16 AM    [facility] [address] [place], [place], [place]  [address] Phone: [phone]; Fax: [phone]     This encounter was created in error - please disregard.

## 2015-12-05 ENCOUNTER — Encounter: Payer: Medicaid Other | Admitting: Physician Assistant

## 2016-01-06 NOTE — Progress Notes (Signed)
Cardiology Office Note:    Date:  01/06/2016   ID:  Andrea Clements, DOB 05-18-1966, MRN [id number]  PCP:  Doris Cheadle, MD  Cardiologist:  Dr. Tonny Bollman   Electrophysiologist:  n/a  Chief Complaint  Patient presents with  . Follow-up    Hypertension    History of Present Illness:     Andrea Clements is a 50 y.o. female with a hx of Cardiomyopathy. Patient presented in 2015 with an acute stroke involving the left basal ganglia. Echocardiogram demonstrated inferior wall motion abnormality and an EF of 30-35%. Conservative management was recommended in the setting of kidney disease, obesity and recent CVA. She has had difficulty with malignant hypertension and has been followed closely in our hypertension clinic. FU echocardiogram 10/29/15 demonstrated improved LV function with an EF of 50-55%, moderate LVH and moderate diastolic dysfunction  She has been followed in the hypertension clinic and was last seen in 9/16. Information regarding her medications is as follows: "Her current BP medications include hydralazine  daily, isosorbide mononitrate  daily, indapamide 2.5mg  daily, amlodipine  daily, spironolactone  daily and metoprolol 100 mg daily . Her indapamide was increased and amlodipine added on 4/25. Her K was low on 4/29 so potassium tablets were added. She had trouble swallowing these so we stopped that and added spironolactone on 5/6. She has intolerances to clonidine patch and pill (dry mouth, lethargy, and skin irritation) and ACE inhibitors- anaphylaxis. She did try amlodipine in the past but this was stopped after her Echo showed an EF of 30-35% and pt was having extensive lower extremity edema." Hydralazine was changed to 10 mg twice a day at this visit.   Last seen by me 10/31/15. Hydralazine dose was adjusted. She returns for follow-up.    Past Medical History  Diagnosis Date  . Hypertension   . Asthma   . Cardiomyopathy (HCC)     a.  09/2014 Echo: EF 30-35%, inflat, inf, infsept AK, Gr 2 DD-->No ischemic w/u 2/2 recent CVA and body habitus.  . CVA (cerebral infarction)     a. Acute sub cm LEFT basal ganglia/internal capsule lacunar infarct, normal MRA.  . Morbid obesity (HCC)   . Tobacco abuse   . CKD (chronic kidney disease), stage III     Past Surgical History  Procedure Laterality Date  . Abdominal hysterectomy    . Cesarean section      Current Medications: Outpatient Prescriptions Prior to Visit  Medication Sig Dispense Refill  . albuterol (PROVENTIL HFA;VENTOLIN HFA) 108 (90 BASE) MCG/ACT inhaler Inhale 2 puffs into the lungs every 6 (six) hours as needed for wheezing or shortness of breath. 1 Inhaler 1  . amLODipine (NORVASC) 10 MG tablet Take 1 tablet (10 mg total) by mouth daily. 90 tablet 3  . clopidogrel (PLAVIX) 75 MG tablet Take 1 tablet (75 mg total) by mouth daily with breakfast. 90 tablet 3  . clotrimazole (LOTRIMIN) 1 % cream Apply 1 application topically 2 (two) times daily. 30 g 0  . hydrALAZINE (APRESOLINE) 25 MG tablet Take 1 tablet (25 mg total) by mouth 3 (three) times daily. 90 tablet 3  . indapamide (LOZOL) 2.5 MG tablet Take 1 tablet (2.5 mg total) by mouth daily. 90 tablet 3  . isosorbide mononitrate (IMDUR) 30 MG 24 hr tablet Take 1 tablet (30 mg total) by mouth daily. 90 tablet 3  . metoprolol succinate (TOPROL-XL) 100 MG 24 hr tablet Take 1 tablet (100 mg total) by mouth daily. Take with  or immediately following a meal. 90 tablet 3  . pantoprazole (PROTONIX) 40 MG tablet Take 1 tablet (40 mg total) by mouth daily. 30 tablet 3  . potassium chloride SA (K-DUR,KLOR-CON) 20 MEQ tablet Take 1 tablet (20 mEq total) by mouth daily. 90 tablet 3  . spironolactone (ALDACTONE) 25 MG tablet Take 1 tablet (25 mg total) by mouth daily. 90 tablet 3   No facility-administered medications prior to visit.     Allergies:   Altace and Aspirin   Social History   Social History  . Marital Status:  Single    Spouse Name: N/A  . Number of Children: N/A  . Years of Education: N/A   Social History Main Topics  . Smoking status: Former Smoker -- 0.50 packs/day for 30 years    Types: Cigarettes    Quit date: 07/10/2015  . Smokeless tobacco: Not on file  . Alcohol Use: No     Comment: occ  . Drug Use: No  . Sexual Activity: Not on file   Other Topics Concern  . Not on file   Social History Narrative     Family History:  The patient's family history includes Cancer in her mother; Hypertension in her father, maternal grandfather, maternal grandmother, mother, paternal grandfather, and paternal grandmother.   ROS:   Please see the history of present illness.    ROS All other systems reviewed and are negative.   Physical Exam:    VS:  There were no vitals taken for this visit.   GEN: Well nourished, well developed, in no acute distress HEENT: normal Neck: no JVD, no masses Cardiac: Normal S1/S2, RRR; no murmurs, rubs, or gallops, no edema;   carotid bruits,   Respiratory:  clear to auscultation bilaterally; no wheezing, rhonchi or rales GI: soft, nontender, nondistended, + BS MS: no deformity or atrophy Skin: warm and dry, no rash Neuro:  Bilateral strength equal, no focal deficits  Psych: Alert and oriented x 3, normal affect  Wt Readings from Last 3 Encounters:  10/31/15 283 lb (128.368 kg)  07/25/15 272 lb 1.6 oz (123.424 kg)  07/06/15 275 lb 9.6 oz (125.011 kg)      Studies/Labs Reviewed:     EKG:  EKG is  ordered today.  The ekg ordered today demonstrates   Recent Labs: 07/25/2015: Hemoglobin 13.3; Platelets 332 10/31/2015: BUN 37*; Creat 2.44*; Potassium 3.8; Sodium 138   Recent Lipid Panel    Component Value Date/Time   CHOL 198 10/03/2014 0730   TRIG 180* 10/03/2014 0730   HDL 35* 10/03/2014 0730   CHOLHDL 5.7 10/03/2014 0730   VLDL 36 10/03/2014 0730   LDLCALC 127* 10/03/2014 0730    Additional studies/ records that were reviewed today include:    Echo 10/29/15 Moderate LVH of the posterior wall, EF 50-55%, diffuse HK, grade 2 diastolic dysfunction, mild AI, trivial MR, mild LAE, normal RV function, increased LA pressure  Echo 01/16/15 Moderate LVH, EF 30-35%, normal wall motion, grade 2 diastolic dysfunction, moderate LAE, mild TR  Carotid US 10/03/14 Bilateral ICA 1-39%  ASSESSMENT:     No diagnosis found.  PLAN:     In order of problems listed above:  1. Hypertensive heart disease - Continue hydralazine, amlodipine, indapamide, isosorbide, metoprolol succinate, spironolactone.   2. Dilated cardiomyopathy - Likely related to uncontrolled hypertension. Recent echocardiogram demonstrates normal LV function. She is NYHA 2. She does not have any significant findings for volume excess on exam.  3. CKD - Recent creatinine  stable.  4. History of CVA - She denies significant residual deficits. Continue Plavix.  5. Tobacco Abuse - We discussed the importance of tobacco cessation.  6. Snoring - Split night sleep study is pending.    Medication Adjustments/Labs and Tests Ordered: Current medicines are reviewed at length with the patient today.  Concerns regarding medicines are outlined above.  Medication changes, Labs and Tests ordered today are outlined in the Patient Instructions noted below. There are no Patient Instructions on file for this visit. Signed, Tereso Newcomer, PA-C  01/06/2016 8:41 PM    [facility] [address] [place], [place], [place]  [address] Phone: [phone]; Fax: [phone]     This encounter was created in error - please disregard.

## 2016-01-07 ENCOUNTER — Encounter (HOSPITAL_BASED_OUTPATIENT_CLINIC_OR_DEPARTMENT_OTHER): Payer: Medicaid Other

## 2016-01-07 ENCOUNTER — Encounter: Payer: Medicaid Other | Admitting: Physician Assistant

## 2016-01-09 ENCOUNTER — Encounter: Payer: Self-pay | Admitting: Physician Assistant

## 2016-02-05 ENCOUNTER — Encounter (HOSPITAL_COMMUNITY): Payer: Self-pay | Admitting: Emergency Medicine

## 2016-02-05 ENCOUNTER — Emergency Department (HOSPITAL_COMMUNITY)
Admission: EM | Admit: 2016-02-05 | Discharge: 2016-02-05 | Disposition: A | Discharge status: Home / Self Care | Payer: Medicaid Other | Attending: Emergency Medicine | Admitting: Emergency Medicine

## 2016-02-05 DIAGNOSIS — N183 Chronic kidney disease, stage 3 (moderate): Secondary | ICD-10-CM | POA: Insufficient documentation

## 2016-02-05 DIAGNOSIS — M7981 Nontraumatic hematoma of soft tissue: Secondary | ICD-10-CM | POA: Insufficient documentation

## 2016-02-05 DIAGNOSIS — Z79899 Other long term (current) drug therapy: Secondary | ICD-10-CM | POA: Diagnosis not present

## 2016-02-05 DIAGNOSIS — Z7902 Long term (current) use of antithrombotics/antiplatelets: Secondary | ICD-10-CM | POA: Diagnosis not present

## 2016-02-05 DIAGNOSIS — Z87891 Personal history of nicotine dependence: Secondary | ICD-10-CM | POA: Diagnosis not present

## 2016-02-05 DIAGNOSIS — R2241 Localized swelling, mass and lump, right lower limb: Secondary | ICD-10-CM | POA: Diagnosis present

## 2016-02-05 DIAGNOSIS — J45909 Unspecified asthma, uncomplicated: Secondary | ICD-10-CM | POA: Insufficient documentation

## 2016-02-05 DIAGNOSIS — Z8673 Personal history of transient ischemic attack (TIA), and cerebral infarction without residual deficits: Secondary | ICD-10-CM | POA: Diagnosis not present

## 2016-02-05 DIAGNOSIS — I129 Hypertensive chronic kidney disease with stage 1 through stage 4 chronic kidney disease, or unspecified chronic kidney disease: Secondary | ICD-10-CM | POA: Diagnosis not present

## 2016-02-05 DIAGNOSIS — M7989 Other specified soft tissue disorders: Secondary | ICD-10-CM

## 2016-02-05 HISTORY — DX: Cerebral infarction, unspecified: I63.9

## 2016-02-05 LAB — CBC WITH DIFFERENTIAL/PLATELET
Basophils Absolute: 0 K/uL (ref 0.0–0.1)
Basophils Relative: 0 %
Eosinophils Absolute: 0.4 K/uL (ref 0.0–0.7)
Eosinophils Relative: 5 %
HCT: 40.7 % (ref 36.0–46.0)
Hemoglobin: 13.4 g/dL (ref 12.0–15.0)
Lymphocytes Relative: 35 %
Lymphs Abs: 2.7 K/uL (ref 0.7–4.0)
MCH: 29 pg (ref 26.0–34.0)
MCHC: 32.9 g/dL (ref 30.0–36.0)
MCV: 88.1 fL (ref 78.0–100.0)
Monocytes Absolute: 0.6 K/uL (ref 0.1–1.0)
Monocytes Relative: 8 %
Neutro Abs: 4.1 K/uL (ref 1.7–7.7)
Neutrophils Relative %: 52 %
Platelets: 247 K/uL (ref 150–400)
RBC: 4.62 MIL/uL (ref 3.87–5.11)
RDW: 15.5 % (ref 11.5–15.5)
WBC: 7.8 K/uL (ref 4.0–10.5)

## 2016-02-05 LAB — PROTIME-INR
INR: 0.96 (ref 0.00–1.49)
Prothrombin Time: 13 s (ref 11.6–15.2)

## 2016-02-05 LAB — COMPREHENSIVE METABOLIC PANEL WITH GFR
ALT: 20 U/L (ref 14–54)
AST: 19 U/L (ref 15–41)
Albumin: 4.1 g/dL (ref 3.5–5.0)
Alkaline Phosphatase: 66 U/L (ref 38–126)
Anion gap: 10 (ref 5–15)
BUN: 36 mg/dL — ABNORMAL HIGH (ref 6–20)
CO2: 24 mmol/L (ref 22–32)
Calcium: 9 mg/dL (ref 8.9–10.3)
Chloride: 105 mmol/L (ref 101–111)
Creatinine, Ser: 2.44 mg/dL — ABNORMAL HIGH (ref 0.44–1.00)
GFR calc Af Amer: 26 mL/min — ABNORMAL LOW
GFR calc non Af Amer: 22 mL/min — ABNORMAL LOW
Glucose, Bld: 103 mg/dL — ABNORMAL HIGH (ref 65–99)
Potassium: 3.6 mmol/L (ref 3.5–5.1)
Sodium: 139 mmol/L (ref 135–145)
Total Bilirubin: 0.2 mg/dL — ABNORMAL LOW (ref 0.3–1.2)
Total Protein: 8 g/dL (ref 6.5–8.1)

## 2016-02-05 MED ORDER — ENOXAPARIN SODIUM 150 MG/ML ~~LOC~~ SOLN
1.0000 mg/kg | Freq: Once | SUBCUTANEOUS | Status: AC
  Administered 2016-02-05: 130 mg via SUBCUTANEOUS
  Filled 2016-02-05: qty 0.85

## 2016-02-05 NOTE — ED Notes (Signed)
PA at bedside.

## 2016-02-05 NOTE — ED Provider Notes (Signed)
CSN: [id number]     Arrival date & time 02/05/16 [id number]   First MD Initiated Contact with Patient 02/05/16 2036     Chief Complaint  Patient presents with  . Leg Swelling  . Bleeding/Bruising     (Consider location/radiation/quality/duration/timing/severity/associated sxs/prior Treatment) HPI  Andrea Clements is a 50 y.o F with a pmhx of HTN, CVA, CK D Who presents to the ED complaining of right leg swelling and bruising. Patient states that last week she began noticing her right ankle what hurt when she walked. It then began to swell and now the swelling is also in her calf as well. Patient also noticed a small bruise on the back of her calf 2 days ago which has gotten progressively larger over the last 2 days. Patient denies trauma or injury to her leg. No associated chest pain or shortness of breath. No history of DVT or PE. Patient is currently on Plavix due to history of stroke.  Past Medical History  Diagnosis Date  . Hypertension   . Asthma   . Cardiomyopathy (HCC)     a. 09/2014 Echo: EF 30-35%, inflat, inf, infsept AK, Gr 2 DD-->No ischemic w/u 2/2 recent CVA and body habitus.  . CVA (cerebral infarction)     a. Acute sub cm LEFT basal ganglia/internal capsule lacunar infarct, normal MRA.  . Morbid obesity (HCC)   . Tobacco abuse   . CKD (chronic kidney disease), stage III   . Stroke [facility])    Past Surgical History  Procedure Laterality Date  . Abdominal hysterectomy    . Cesarean section     Family History  Problem Relation Age of Onset  . Hypertension Mother   . Cancer Mother   . Hypertension Father   . Hypertension Maternal Grandmother   . Hypertension Maternal Grandfather   . Hypertension Paternal Grandmother   . Hypertension Paternal Grandfather    Social History  Substance Use Topics  . Smoking status: Former Smoker -- 0.50 packs/day for 30 years    Types: Cigarettes    Quit date: 07/10/2015  . Smokeless tobacco: None  . Alcohol Use: No      Comment: occ   OB History    No data available     Review of Systems  All other systems reviewed and are negative.     Allergies  Altace and Aspirin  Home Medications   Prior to Admission medications   Medication Sig Start Date End Date Taking? Authorizing Provider  amLODipine (NORVASC) 10 MG tablet Take 1 tablet (10 mg total) by mouth daily. 04/30/15  Yes Tonny Bollman, MD  clopidogrel (PLAVIX) 75 MG tablet Take 1 tablet (75 mg total) by mouth daily with breakfast. 10/31/15  Yes Beatrice Lecher, PA-C  hydrALAZINE (APRESOLINE) 25 MG tablet Take 1 tablet (25 mg total) by mouth 3 (three) times daily. Patient taking differently: Take 25 mg by mouth daily.  10/31/15  Yes Scott Moishe Spice, PA-C  isosorbide mononitrate (IMDUR) 30 MG 24 hr tablet Take 1 tablet (30 mg total) by mouth daily. 10/31/15  Yes Beatrice Lecher, PA-C  metoprolol succinate (TOPROL-XL) 100 MG 24 hr tablet Take 1 tablet (100 mg total) by mouth daily. Take with or immediately following a meal. 10/31/15  Yes Scott T Alben Spittle, PA-C  spironolactone (ALDACTONE) 25 MG tablet Take 1 tablet (25 mg total) by mouth daily. 04/30/15  Yes Tonny Bollman, MD  albuterol (PROVENTIL HFA;VENTOLIN HFA) 108 (90 BASE) MCG/ACT inhaler Inhale 2 puffs  into the lungs every 6 (six) hours as needed for wheezing or shortness of breath. 10/10/14   Doris Cheadle, MD  clotrimazole (LOTRIMIN) 1 % cream Apply 1 application topically 2 (two) times daily. Patient not taking: Reported on 02/05/2016 07/06/15   Barbaraann Barthel, MD  indapamide (LOZOL) 2.5 MG tablet Take 1 tablet (2.5 mg total) by mouth daily. Patient not taking: Reported on 02/05/2016 04/30/15   Tonny Bollman, MD  pantoprazole (PROTONIX) 40 MG tablet Take 1 tablet (40 mg total) by mouth daily. Patient taking differently: Take 40 mg by mouth daily as needed (heartburn).  08/28/15   Tonny Bollman, MD  potassium chloride SA (K-DUR,KLOR-CON) 20 MEQ tablet Take 1 tablet (20 mEq total) by mouth  daily. Patient not taking: Reported on 02/05/2016 04/30/15   Tonny Bollman, MD   BP 118/92 mmHg  Pulse 84  Temp(Src) 97.9 F (36.6 C) (Oral)  Resp 15  Wt 127.461 kg  SpO2 97% Physical Exam  Constitutional: She is oriented to person, place, and time. She appears well-developed and well-nourished. No distress.  Obese   HENT:  Head: Normocephalic and atraumatic.  Mouth/Throat: No oropharyngeal exudate.  Eyes: Conjunctivae and EOM are normal. Pupils are equal, round, and reactive to light. Right eye exhibits no discharge. Left eye exhibits no discharge. No scleral icterus.  Cardiovascular: Normal rate, regular rhythm, normal heart sounds and intact distal pulses.  Exam reveals no gallop and no friction rub.   No murmur heard. Pulmonary/Chest: Effort normal and breath sounds normal. No respiratory distress. She has no wheezes. She has no rales. She exhibits no tenderness.  Abdominal: Soft. She exhibits no distension. There is no tenderness. There is no guarding.  Musculoskeletal: Normal range of motion. She exhibits edema.  Ecchymosis present over R calf. NTTP. Non-pitting edema in RLE below knee. Skin is warm. Intact distal pulses. Good cap refill.   Neurological: She is alert and oriented to person, place, and time.  Skin: Skin is warm and dry. No rash noted. She is not diaphoretic. No erythema. No pallor.  Psychiatric: She has a normal mood and affect. Her behavior is normal.  Nursing note and vitals reviewed.   ED Course  Procedures (including critical care time) Labs Review Labs Reviewed  COMPREHENSIVE METABOLIC PANEL - Abnormal; Notable for the following:    Glucose, Bld 103 (*)    BUN 36 (*)    Creatinine, Ser 2.44 (*)    Total Bilirubin 0.2 (*)    GFR calc non Af Amer 22 (*)    GFR calc Af Amer 26 (*)    All other components within normal limits  CBC WITH DIFFERENTIAL/PLATELET  PROTIME-INR    Imaging Review No results found. I have personally reviewed and evaluated  these images and lab results as part of my medical decision-making.   EKG Interpretation None      MDM   Final diagnoses:  Swelling of lower extremity    50 y.o F with multiple co-morbidities presents to the ED with atraumatic RLE swelling and bruising to back of R calf. Pt is currently on Plavix for previous CVA. Pt appears well in ED, in NAD. Concern for DVT. Unfortunately vascular US is not currently available. Will order lab work to evaluate for coagulopathy and thrombocytopenia given atraumatic ecchymosis. INR wnl. No thrombocytopenia. Will order prophylactic lovenox injection and have pt follow up tomorrow morning to have LE venous US performed. No hypoxia, no tacycardia. No CP or SOB. Doubt PE.  Ankle brace given  in ED for support. BUN and Cr elevated, this is pts baseline due to CKD. Return precautions outlined in patient discharge instructions.   Case discussed with Dr. Clarene Duke who agrees with treatment plan.    Dub Mikes, PA-C 02/07/16 1555  Samuel Jester, DO 02/08/16 [phone]

## 2016-02-05 NOTE — ED Notes (Signed)
Hx of stroke, on plavix currently. Pt states two days ago noticed small bruise to right lower calf. Over the last two days right lower leg and foot have been swelling and having worsening pain. States today she can't walk d/t the pain, obvious swelling at ankle, states she did nothing to injure area. 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 Denies CP, SOB

## 2016-02-05 NOTE — Discharge Instructions (Signed)
Edema °Edema is an abnormal buildup of fluids in your body tissues. Edema is somewhat dependent on gravity to pull the fluid to the lowest place in your body. That makes the condition more common in the legs and thighs (lower extremities). Painless swelling of the feet and ankles is common and becomes more likely as you get older. It is also common in looser tissues, like around your eyes.  °When the affected area is squeezed, the fluid may move out of that spot and leave a dent for a few moments. This dent is called pitting.  °CAUSES  °There are many possible causes of edema. Eating too much salt and being on your feet or sitting for a long time can cause edema in your legs and ankles. Hot weather may make edema worse. Common medical causes of edema include: °· Heart failure. °· Liver disease. °· Kidney disease. °· Weak blood vessels in your legs. °· Cancer. °· An injury. °· Pregnancy. °· Some medications. °· Obesity.  °SYMPTOMS  °Edema is usually painless. Your skin may look swollen or shiny.  °DIAGNOSIS  °Your health care provider may be able to diagnose edema by asking about your medical history and doing a physical exam. You may need to have tests such as X-rays, an electrocardiogram, or blood tests to check for medical conditions that may cause edema.  °TREATMENT  °Edema treatment depends on the cause. If you have heart, liver, or kidney disease, you need the treatment appropriate for these conditions. General treatment may include: °· Elevation of the affected body part above the level of your heart. °· Compression of the affected body part. Pressure from elastic bandages or support stockings squeezes the tissues and forces fluid back into the blood vessels. This keeps fluid from entering the tissues. °· Restriction of fluid and salt intake. °· Use of a water pill (diuretic). These medications are appropriate only for some types of edema. They pull fluid out of your body and make you urinate more often. This  gets rid of fluid and reduces swelling, but diuretics can have side effects. Only use diuretics as directed by your health care provider. °HOME CARE INSTRUCTIONS  °· Keep the affected body part above the level of your heart when you are lying down.   °· Do not sit still or stand for prolonged periods.   °· Do not put anything directly under your knees when lying down. °· Do not wear constricting clothing or garters on your upper legs.   °· Exercise your legs to work the fluid back into your blood vessels. This may help the swelling go down.   °· Wear elastic bandages or support stockings to reduce ankle swelling as directed by your health care provider.   °· Eat a low-salt diet to reduce fluid if your health care provider recommends it.   °· Only take medicines as directed by your health care provider.  °SEEK MEDICAL CARE IF:  °· Your edema is not responding to treatment. °· You have heart, liver, or kidney disease and notice symptoms of edema. °· You have edema in your legs that does not improve after elevating them.   °· You have sudden and unexplained weight gain. °SEEK IMMEDIATE MEDICAL CARE IF:  °· You develop shortness of breath or chest pain.   °· You cannot breathe when you lie down. °· You develop pain, redness, or warmth in the swollen areas.   °· You have heart, liver, or kidney disease and suddenly get edema. °· You have a fever and your symptoms suddenly get worse. °MAKE SURE YOU:  °·   Understand these instructions.  Will watch your condition.  Will get help right away if you are not doing well or get worse.   This information is not intended to replace advice given to you by your health care provider. Make sure you discuss any questions you have with your health care provider.  Return to [facility] tomorrow for ultrasound study of your right lower extremity. Return to the ED sooner if you experience chest pain, shortness of breath, dizziness or loss of consciousness.

## 2016-02-05 NOTE — ED Notes (Signed)
Instructed patient to remain in wheelchair, to walk on leg as little as possible, and not to massage the area incase it is a blood clot.

## 2016-02-06 ENCOUNTER — Ambulatory Visit (HOSPITAL_COMMUNITY): Admission: RE | Admit: 2016-02-06 | Payer: Medicaid Other | Source: Ambulatory Visit

## 2016-02-06 ENCOUNTER — Ambulatory Visit (HOSPITAL_COMMUNITY)
Admission: RE | Admit: 2016-02-06 | Discharge: 2016-02-06 | Disposition: A | Discharge status: Home / Self Care | Payer: Medicaid Other | Source: Ambulatory Visit | Attending: Emergency Medicine | Admitting: Emergency Medicine

## 2016-02-06 DIAGNOSIS — M79604 Pain in right leg: Secondary | ICD-10-CM | POA: Insufficient documentation

## 2016-02-06 DIAGNOSIS — M7989 Other specified soft tissue disorders: Secondary | ICD-10-CM | POA: Diagnosis not present

## 2016-02-06 DIAGNOSIS — M79609 Pain in unspecified limb: Secondary | ICD-10-CM | POA: Diagnosis not present

## 2016-02-06 NOTE — Progress Notes (Signed)
VASCULAR LAB PRELIMINARY  PRELIMINARY  PRELIMINARY  PRELIMINARY  Right lower extremity venous duplex completed.    Right leg-  No evidence of DVT, superficial thrombosis, or Baker's Cyst. Lymph node identified in the right groin area.  Marji Kuehnel, RVT, RDMS 02/06/2016, 1:28 PM

## 2016-02-22 ENCOUNTER — Telehealth: Payer: Self-pay | Admitting: Cardiovascular Disease

## 2016-02-22 NOTE — Telephone Encounter (Signed)
New message   Patient calling    Pt C/O medication issue:  1. Name of Medication: Plavix 75 mg   2. How are you currently taking this medication (dosage and times per day)? One time a day - in the afternoon   3. Are you having a reaction (difficulty breathing--STAT)? Bruising back of both legs / pain   4. What is your medication issue? Wants to discuss with nurse.    Patient verbalize she need to have a blood pressure check

## 2016-02-22 NOTE — Telephone Encounter (Signed)
Patient stated that couple of weeks ago she was swelling, pain, and bruising in her right leg Went to ED and DVT was ruled out For the last week patient has had right leg pain, swelling in knew, and bruising. 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

## 2016-02-26 ENCOUNTER — Ambulatory Visit (INDEPENDENT_AMBULATORY_CARE_PROVIDER_SITE_OTHER): Payer: Medicaid Other | Admitting: Pharmacist

## 2016-02-26 VITALS — BP 154/92 | HR 63 | Ht 61.0 in | Wt 281.2 lb

## 2016-02-26 DIAGNOSIS — I10 Essential (primary) hypertension: Secondary | ICD-10-CM | POA: Diagnosis not present

## 2016-02-26 NOTE — Progress Notes (Signed)
Patient ID: [name]                 DOB: 06-Nov-1966, 50 yo                        MRN: [id number]     HPI: Andrea Clements is a 50 y.o. female who is well established with the HTN clinic who presents today for follow up. PMH significant for CVA, cardiomyopathy, and CKD stage 3.  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 She needs her DOT card and clearance for physical fitness includes a requirement for BP controlled < 140/73mmHg by June. Advised pt to contact PCP for overall clearance and we will work on BP control here. She has not taken any of her BP medications yet today - she takes them at night since she reports they make her sleepy.   Current HTN meds: Hydralazine  once daily, indapamide 2.5mg  daily, spironolactone  daily, isosorbide mononitrate  daily, amlodipine  daily Previously tried: Clonidine patch and pill (dry mouth, lethargy, and skin irritation), ACEi (anaphylaxis) BP goal: <140/85mmHg  Family History: Mother with HTN and cancer, father with HTN, all 4 grandparents with HTN  Social History: Former smoker - 1/2 PPD for 30 years. Quit 07/10/2015. Pt reports that she occasionally drinks alcohol and does not use illicit drugs.  Diet: Does not add salt to food. Eats black beans and rice mainly. 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  Exercise: Walks 0.5 miles a few times a month at a leisurely pace.   Home BP readings: Does not check at home.  Wt Readings from Last 3 Encounters:  02/26/16 281 lb 4 oz (127.574 kg)  02/05/16 281 lb (127.461 kg)  10/31/15 283 lb (128.368 kg)   BP Readings from Last 3 Encounters:  02/05/16 140/77  10/31/15 142/102  07/31/15 152/88   Pulse Readings from Last 3 Encounters:  02/05/16 76  10/31/15 80  07/25/15 78    Renal function: Estimated Creatinine  Clearance: 35.9 mL/min (by C-G formula based on Cr of 2.44).  Past Medical History  Diagnosis Date  . Hypertension   . Asthma   . Cardiomyopathy (HCC)     a. 09/2014 Echo: EF 30-35%, inflat, inf, infsept AK, Gr 2 DD-->No ischemic w/u 2/2 recent CVA and body habitus.  . CVA (cerebral infarction)     a. Acute sub cm LEFT basal ganglia/internal capsule lacunar infarct, normal MRA.  . Morbid obesity (HCC)   . Tobacco abuse   . CKD (chronic kidney disease), stage III   . Stroke [facility])     Current Outpatient Prescriptions on File Prior to Visit  Medication Sig Dispense Refill  . albuterol (PROVENTIL HFA;VENTOLIN HFA) 108 (90 BASE) MCG/ACT inhaler Inhale 2 puffs into the lungs every 6 (six) hours as needed for wheezing or shortness of breath. 1 Inhaler 1  . amLODipine (NORVASC) 10 MG tablet Take 1 tablet (10 mg total) by mouth daily. 90 tablet 3  . clopidogrel (PLAVIX) 75 MG tablet Take 1 tablet (75 mg total) by mouth daily with breakfast. 90 tablet 3  . clotrimazole (LOTRIMIN) 1 % cream Apply 1 application topically 2 (two) times daily. (Patient not taking: Reported on 02/05/2016) 30 g 0  . hydrALAZINE (APRESOLINE) 25 MG tablet Take  1 tablet (25 mg total) by mouth 3 (three) times daily. (Patient taking differently: Take 25 mg by mouth daily. ) 90 tablet 3  . indapamide (LOZOL) 2.5 MG tablet Take 1 tablet (2.5 mg total) by mouth daily. (Patient not taking: Reported on 02/05/2016) 90 tablet 3  . isosorbide mononitrate (IMDUR) 30 MG 24 hr tablet Take 1 tablet (30 mg total) by mouth daily. 90 tablet 3  . metoprolol succinate (TOPROL-XL) 100 MG 24 hr tablet Take 1 tablet (100 mg total) by mouth daily. Take with or immediately following a meal. 90 tablet 3  . pantoprazole (PROTONIX) 40 MG tablet Take 1 tablet (40 mg total) by mouth daily. (Patient taking differently: Take 40 mg by mouth daily as needed (heartburn). ) 30 tablet 3  . potassium chloride SA (K-DUR,KLOR-CON) 20 MEQ tablet Take 1 tablet (20  mEq total) by mouth daily. (Patient not taking: Reported on 02/05/2016) 90 tablet 3  . spironolactone (ALDACTONE) 25 MG tablet Take 1 tablet (25 mg total) by mouth daily. 90 tablet 3  . [DISCONTINUED] simvastatin (ZOCOR) 20 MG tablet Take 1 tablet (20 mg total) by mouth at bedtime. (Patient not taking: Reported on 04/30/2015) 30 tablet 4   No current facility-administered medications on file prior to visit.    Allergies  Allergen Reactions  . Altace [Ramipril] Anaphylaxis  . Aspirin Anaphylaxis     Assessment/Plan:  1. Hypertension - BP 154/61mmHg above goal <140/89mmHg. Will increase hydralazine to 25mg  TID and continue other BP medications. Discussed lifestyle changes; patient agrees to start walking for 30 minutes 3-5 times per week. She will start checking her BP at home and recording readings in a log and bring them to next visit. F/u in HTN clinic in 3 weeks.   Annely Sliva E. Mikhail Hallenbeck, PharmD, CPP [facility] [address] [place], [place] [address] Phone: [phone]; Fax: [phone] 02/26/2016 4:15 PM

## 2016-02-26 NOTE — Patient Instructions (Signed)
Start taking your hydralazine 25mg  3 times a day. Continue your other blood pressure medications. Start walking for 30 minutes 3-5 times a week. Try to eat smaller more frequent meals throughout the day. Please record blood pressure readings at home and bring them in at next visit along with your blood pressure cuff.

## 2016-03-09 ENCOUNTER — Encounter (HOSPITAL_BASED_OUTPATIENT_CLINIC_OR_DEPARTMENT_OTHER): Payer: Medicaid Other

## 2016-03-17 ENCOUNTER — Ambulatory Visit: Payer: Medicaid Other | Attending: Internal Medicine | Admitting: Internal Medicine

## 2016-03-17 ENCOUNTER — Encounter: Payer: Self-pay | Admitting: Internal Medicine

## 2016-03-17 VITALS — BP 146/86 | HR 67 | Temp 98.4°F | Resp 18 | Ht 61.0 in | Wt 279.0 lb

## 2016-03-17 DIAGNOSIS — I129 Hypertensive chronic kidney disease with stage 1 through stage 4 chronic kidney disease, or unspecified chronic kidney disease: Secondary | ICD-10-CM | POA: Diagnosis not present

## 2016-03-17 DIAGNOSIS — N184 Chronic kidney disease, stage 4 (severe): Secondary | ICD-10-CM

## 2016-03-17 DIAGNOSIS — Z79899 Other long term (current) drug therapy: Secondary | ICD-10-CM | POA: Diagnosis not present

## 2016-03-17 DIAGNOSIS — I429 Cardiomyopathy, unspecified: Secondary | ICD-10-CM | POA: Diagnosis not present

## 2016-03-17 DIAGNOSIS — J45909 Unspecified asthma, uncomplicated: Secondary | ICD-10-CM | POA: Insufficient documentation

## 2016-03-17 DIAGNOSIS — Z8673 Personal history of transient ischemic attack (TIA), and cerebral infarction without residual deficits: Secondary | ICD-10-CM | POA: Diagnosis not present

## 2016-03-17 DIAGNOSIS — R7303 Prediabetes: Secondary | ICD-10-CM | POA: Diagnosis not present

## 2016-03-17 DIAGNOSIS — I10 Essential (primary) hypertension: Secondary | ICD-10-CM | POA: Diagnosis not present

## 2016-03-17 DIAGNOSIS — E8881 Metabolic syndrome: Secondary | ICD-10-CM

## 2016-03-17 DIAGNOSIS — Z888 Allergy status to other drugs, medicaments and biological substances status: Secondary | ICD-10-CM | POA: Insufficient documentation

## 2016-03-17 DIAGNOSIS — Z90710 Acquired absence of both cervix and uterus: Secondary | ICD-10-CM | POA: Diagnosis not present

## 2016-03-17 DIAGNOSIS — Z87891 Personal history of nicotine dependence: Secondary | ICD-10-CM | POA: Insufficient documentation

## 2016-03-17 LAB — BASIC METABOLIC PANEL WITH GFR
BUN: 29 mg/dL — ABNORMAL HIGH (ref 7–25)
CO2: 28 mmol/L (ref 20–31)
Calcium: 9.5 mg/dL (ref 8.6–10.2)
Chloride: 103 mmol/L (ref 98–110)
Creat: 2.11 mg/dL — ABNORMAL HIGH (ref 0.50–1.10)
GFR, EST AFRICAN AMERICAN: 31 mL/min — AB (ref >=60)
GFR, EST NON AFRICAN AMERICAN: 27 mL/min — AB (ref >=60)
Glucose, Bld: 98 mg/dL (ref 65–99)
POTASSIUM: 4.4 mmol/L (ref 3.5–5.3)
Sodium: 140 mmol/L (ref 135–146)

## 2016-03-17 LAB — LIPID PANEL
CHOL/HDL RATIO: 6.5 ratio — AB (ref <=5.0)
CHOLESTEROL: 220 mg/dL — AB (ref 125–200)
HDL: 34 mg/dL — AB (ref >=46)
LDL Cholesterol: 150 mg/dL — ABNORMAL HIGH (ref <130)
Triglycerides: 178 mg/dL — ABNORMAL HIGH (ref <150)
VLDL: 36 mg/dL — ABNORMAL HIGH (ref <30)

## 2016-03-17 LAB — TSH: TSH: 4.5 mIU/L

## 2016-03-17 LAB — POCT GLYCOSYLATED HEMOGLOBIN (HGB A1C): HEMOGLOBIN A1C: 6.4

## 2016-03-17 MED ORDER — METFORMIN HCL 1000 MG PO TABS: 1000.0000 mg | ORAL_TABLET | Freq: Two times a day (BID) | ORAL | Status: DC

## 2016-03-17 MED ORDER — TRUEPLUS LANCETS 26G MISC: 1.0000 | Freq: Three times a day (TID) | Status: DC | PRN

## 2016-03-17 MED ORDER — TRUE METRIX GO GLUCOSE METER W/DEVICE KIT: 1.0000 | PACK | Freq: Three times a day (TID) | Status: DC | PRN

## 2016-03-17 MED ORDER — GLUCOSE BLOOD VI STRP: ORAL_STRIP | Status: DC

## 2016-03-17 MED FILL — TRUE METRIX TEST STRIP: 25 days supply | Qty: 100 | Fill #0

## 2016-03-17 MED FILL — !TRUE METRIX BLOOD GLUCOSE: 1 days supply | Qty: 1 | Fill #0

## 2016-03-17 MED FILL — TRUEplus LANCETS 28G MISC: 25 days supply | Qty: 100 | Fill #0

## 2016-03-17 MED FILL — ?METFORMIN HCL 1,000 MG TAB: 1000 | 30 days supply | Qty: 60 | Fill #0

## 2016-03-17 NOTE — Progress Notes (Addendum)
Andrea Clements, is a 50 y.o. female  ZOX:[id number]  [id number]:[id number]  DOB - 1966/05/19  CC:  Chief Complaint  Patient presents with  . Establish Care    HTN       HPI: Andrea Clements is a 50 y.o. female w/ Signif PMHx of cardiomyopathy (echo ef 30-35% 2015, repeat ef 50-55% 12/16) , cva involving left basal ganglia 2015, ckd 3, htn, morbid obesity,  here today to establish medical care., last seen in clinic 07/06/15.  Pt states she is doing well, but only taking her Hydralazine bid since it give her headache.  She states she does watch her salt intake as well. Not exercising/walking as much these days though.  She has been following mainly w/ cardiology for her htn.   Sp hysterectomy for menorrhagia.   Patient has No headache, No chest pain, No abdominal pain - No Nausea, No new weakness tingling or numbness, No Cough - SOB.  +  intermittant back pain, better when she takes Naproxen sometimes.  Allergies  Allergen Reactions  . Altace [Ramipril] Anaphylaxis  . Aspirin Anaphylaxis   Past Medical History  Diagnosis Date  . Hypertension   . Asthma   . Cardiomyopathy (HCC)     a. 09/2014 Echo: EF 30-35%, inflat, inf, infsept AK, Gr 2 DD-->No ischemic w/u 2/2 recent CVA and body habitus.  . CVA (cerebral infarction)     a. Acute sub cm LEFT basal ganglia/internal capsule lacunar infarct, normal MRA.  . Morbid obesity (HCC)   . Tobacco abuse   . CKD (chronic kidney disease), stage III   . Stroke [facility])    Current Outpatient Prescriptions on File Prior to Visit  Medication Sig Dispense Refill  . albuterol (PROVENTIL HFA;VENTOLIN HFA) 108 (90 BASE) MCG/ACT inhaler Inhale 2 puffs into the lungs every 6 (six) hours as needed for wheezing or shortness of breath. 1 Inhaler 1  . amLODipine (NORVASC) 10 MG tablet Take 1 tablet (10 mg total) by mouth daily. 90 tablet 3  . clopidogrel (PLAVIX) 75 MG tablet Take 1 tablet (75 mg total) by mouth daily with breakfast. 90 tablet 3  .  hydrALAZINE (APRESOLINE) 25 MG tablet Take 1 tablet (25 mg total) by mouth 3 (three) times daily. (Patient taking differently: Take 25 mg by mouth daily. ) 90 tablet 3  . isosorbide mononitrate (IMDUR) 30 MG 24 hr tablet Take 1 tablet (30 mg total) by mouth daily. 90 tablet 3  . metoprolol succinate (TOPROL-XL) 100 MG 24 hr tablet Take 1 tablet (100 mg total) by mouth daily. Take with or immediately following a meal. 90 tablet 3  . pantoprazole (PROTONIX) 40 MG tablet Take 1 tablet (40 mg total) by mouth daily. (Patient taking differently: Take 40 mg by mouth daily as needed (heartburn). ) 30 tablet 3  . potassium chloride SA (K-DUR,KLOR-CON) 20 MEQ tablet Take 1 tablet (20 mEq total) by mouth daily. 90 tablet 3  . spironolactone (ALDACTONE) 25 MG tablet Take 1 tablet (25 mg total) by mouth daily. 90 tablet 3  . clotrimazole (LOTRIMIN) 1 % cream Apply 1 application topically 2 (two) times daily. (Patient not taking: Reported on 02/05/2016) 30 g 0  . indapamide (LOZOL) 2.5 MG tablet Take 1 tablet (2.5 mg total) by mouth daily. (Patient not taking: Reported on 02/05/2016) 90 tablet 3  . [DISCONTINUED] simvastatin (ZOCOR) 20 MG tablet Take 1 tablet (20 mg total) by mouth at bedtime. (Patient not taking: Reported on 04/30/2015) 30 tablet 4   No  current facility-administered medications on file prior to visit.   Family History  Problem Relation Age of Onset  . Hypertension Mother   . Cancer Mother   . Hypertension Father   . Hypertension Maternal Grandmother   . Hypertension Maternal Grandfather   . Hypertension Paternal Grandmother   . Hypertension Paternal Grandfather    Social History   Social History  . Marital Status: Single    Spouse Name: N/A  . Number of Children: N/A  . Years of Education: N/A   Occupational History  . Not on file.   Social History Main Topics  . Smoking status: Former Smoker -- 0.50 packs/day for 30 years    Types: Cigarettes    Quit date: 07/10/2015  .  Smokeless tobacco: Not on file  . Alcohol Use: No     Comment: occ  . Drug Use: No  . Sexual Activity: Not on file   Other Topics Concern  . Not on file   Social History Narrative    Review of Systems: Constitutional: Negative for fever, chills, diaphoresis, activity change, appetite change and fatigue. HENT: Negative for ear pain, nosebleeds, congestion, facial swelling, rhinorrhea, neck pain, neck stiffness and ear discharge.  Eyes: Negative for pain, discharge, redness, itching and visual disturbance. Respiratory: Negative for cough, choking, chest tightness, shortness of breath, wheezing and stridor.  Cardiovascular: Negative for chest pain, palpitations and leg swelling. Gastrointestinal: Negative for abdominal distention. Genitourinary: Negative for dysuria, urgency, frequency, hematuria, flank pain, decreased urine volume, difficulty urinating and dyspareunia.  Musculoskeletal: Negative joint swelling, arthralgia and gait problem.  + intermittent back pain, improves w/ naproxen prn from friend.  Attempt disability ppwk but denied x 2 in past. Neurological: Negative for dizziness, tremors, seizures, syncope, facial asymmetry, speech difficulty, weakness, light-headedness, numbness and headaches.  Hematological: Negative for adenopathy. Does not bruise/bleed easily. Psychiatric/Behavioral: Negative for hallucinations, behavioral problems, confusion, dysphoric mood, decreased concentration and agitation.    Objective:   Filed Vitals:   03/17/16 1039  BP: 146/86  Pulse: 67  Temp: 98.4 F (36.9 C)  Resp: 18    Filed Weights   03/17/16 1039  Weight: 279 lb (126.554 kg)    BP Readings from Last 3 Encounters:  03/17/16 146/86  02/26/16 154/92  02/05/16 140/77   bmi 53  Physical Exam: Constitutional: Patient appears well-developed and well-nourished. No distress. AAOx3, morbid obese HENT: Normocephalic, atraumatic, External right and left ear normal. Oropharynx is  clear and moist.  Eyes: Conjunctivae and EOM are normal. PERRL, no scleral icterus. Neck: Normal ROM. Neck supple. No JVD.  CVS: RRR, S1/S2 +, no murmurs, no gallops, no carotid bruit.  Pulmonary: Effort and breath sounds normal, no stridor, rhonchi, wheezes, rales.  Abdominal: Soft. Obese,  BS +, no distension, tenderness, rebound or guarding.  Musculoskeletal: Normal range of motion. No edema and no tenderness.  LE: bilat/ no c/c/e, pulses 2+ bilateral. Lymphadenopathy: No lymphadenopathy noted, cervical Neuro: Alert.  muscle tone coordination. No cranial nerve deficit grossly. Skin: Skin is warm and dry. No rash noted. Not diaphoretic. No erythema. No pallor. Psychiatric: Normal mood and affect. Behavior, judgment, thought content normal.  Lab Results  Component Value Date   WBC 7.8 02/05/2016   HGB 13.4 02/05/2016   HCT 40.7 02/05/2016   MCV 88.1 02/05/2016   PLT 247 02/05/2016   Lab Results  Component Value Date   CREATININE 2.44* 02/05/2016   BUN 36* 02/05/2016   NA 139 02/05/2016   K 3.6 02/05/2016  CL 105 02/05/2016   CO2 24 02/05/2016    Lab Results  Component Value Date   HGBA1C 6.4 03/17/2016   Lipid Panel     Component Value Date/Time   CHOL 198 10/03/2014 0730   TRIG 180* 10/03/2014 0730   HDL 35* 10/03/2014 0730   CHOLHDL 5.7 10/03/2014 0730   VLDL 36 10/03/2014 0730   LDLCALC 127* 10/03/2014 0730       Depression screen PHQ 2/9 03/17/2016 10/10/2014  Decreased Interest 0 0  Down, Depressed, Hopeless 0 -  PHQ - 2 Score 0 0    Assessment and plan:   1. Essential hypertension -on numerous medications at this time, including norvasc 10, taking hydralazine 25 bid (rather than tid) due to headaches, toprol xl  qd, imdur  qd, spironolactone 25 qd - not much room to chg norvasc and bb given hr, advised her to try taking hydralazine 25tid for while, in hopes headaches resolved - emphasized low salt diet and exercise today. - BASIC METABOLIC  PANEL WITH GFR - Lipid panel - fasting today  2. Prediabetes, in setting of htn/morbid obesity, +metabolic syndrome.  -dm education given, will start metformin 1000bid, in hopes of weightloss as well, cautioned pt on gi effects of metformin. - POCT A1C 6.4  3. CKD stage 4 secondary to hypertension (HCC) - concerning labs on 3/17, for ckd4 - needs tight control of bp and predm - Ambulatory referral to Nephrology - made  - VAS US RENAL ARTERY DUPLEX; Future - r/o RAS - US Renal; Future - further eval - bmp today  4. Morbid obesity, unspecified obesity type (HCC) - weightloss counseling, increase diet, discussed today - TSH eval,  5. ?osa - has sleep study pending 5/22, encouraged to keep, will follow.  6. Post hysterectomy - no need for papsmear.  Fu 2 wks w/ Misty Stanley / pharm/ for dm/htn  Return in about 2 months (around 05/17/2016), or if symptoms worsen or fail to improve, for htn/ckd.  The patient was given clear instructions to go to ER or return to medical center if symptoms don't improve, worsen or new problems develop. The patient verbalized understanding. The patient was told to call to get lab results if they haven't heard anything in the next week.      Pete Glatter, MD, MBA/MHA [facility] [place], [place] [phone]   03/17/2016, 11:08 AM

## 2016-03-17 NOTE — Patient Instructions (Signed)
Low-Sodium Eating Plan Sodium raises blood pressure and causes water to be held in the body. Getting less sodium from food will help lower your blood pressure, reduce any swelling, and protect your heart, liver, and kidneys. We get sodium by adding salt (sodium chloride) to food. Most of our sodium comes from canned, boxed, and frozen foods. Restaurant foods, fast foods, and pizza are also very high in sodium. Even if you take medicine to lower your blood pressure or to reduce fluid in your body, getting less sodium from your food is important. WHAT IS MY PLAN? Most people should limit their sodium intake to 2,300 mg a day. Your health care provider recommends that you limit your sodium intake to __________ a day.  WHAT DO I NEED TO KNOW ABOUT THIS EATING PLAN? For the low-sodium eating plan, you will follow these general guidelines:  Choose foods with a % Daily Value for sodium of less than 5% (as listed on the food label).   Use salt-free seasonings or herbs instead of table salt or sea salt.   Check with your health care provider or pharmacist before using salt substitutes.   Eat fresh foods.  Eat more vegetables and fruits.  Limit canned vegetables. If you do use them, rinse them well to decrease the sodium.   Limit cheese to 1 oz (28 g) per day.   Eat lower-sodium products, often labeled as "lower sodium" or "no salt added."  Avoid foods that contain monosodium glutamate (MSG). MSG is sometimes added to [place] food and some canned foods.  Check food labels (Nutrition Facts labels) on foods to learn how much sodium is in one serving.  Eat more home-cooked food and less restaurant, buffet, and fast food.  When eating at a restaurant, ask that your food be prepared with less salt, or no salt if possible.  HOW DO I READ FOOD LABELS FOR SODIUM INFORMATION? The Nutrition Facts label lists the amount of sodium in one serving of the food. If you eat more than one serving, you  must multiply the listed amount of sodium by the number of servings. Food labels may also identify foods as:  Sodium free--Less than 5 mg in a serving.  Very low sodium--35 mg or less in a serving.  Low sodium--140 mg or less in a serving.  Light in sodium--50% less sodium in a serving. For example, if a food that usually has 300 mg of sodium is changed to become light in sodium, it will have 150 mg of sodium.  Reduced sodium--25% less sodium in a serving. For example, if a food that usually has 400 mg of sodium is changed to reduced sodium, it will have 300 mg of sodium. WHAT FOODS CAN I EAT? Grains Low-sodium cereals, including oats, puffed wheat and rice, and shredded wheat cereals. Low-sodium crackers. Unsalted rice and pasta. Lower-sodium bread.  Vegetables Frozen or fresh vegetables. Low-sodium or reduced-sodium canned vegetables. Low-sodium or reduced-sodium tomato sauce and paste. Low-sodium or reduced-sodium tomato and vegetable juices.  Fruits Fresh, frozen, and canned fruit. Fruit juice.  Meat and Other Protein Products Low-sodium canned tuna and salmon. Fresh or frozen meat, poultry, seafood, and fish. Lamb. Unsalted nuts. Dried beans, peas, and lentils without added salt. Unsalted canned beans. Homemade soups without salt. Eggs.  Dairy Milk. Soy milk. Ricotta cheese. Low-sodium or reduced-sodium cheeses. Yogurt.  Condiments Fresh and dried herbs and spices. Salt-free seasonings. Onion and garlic powders. Low-sodium varieties of mustard and ketchup. Fresh or refrigerated horseradish. [place]  juice.  Fats and Oils Reduced-sodium salad dressings. Unsalted butter.  Other Unsalted popcorn and pretzels.  The items listed above may not be a complete list of recommended foods or beverages. Contact your dietitian for more options. WHAT FOODS ARE NOT RECOMMENDED? Grains Instant hot cereals. Bread stuffing, pancake, and biscuit mixes. Croutons. Seasoned rice or pasta mixes.  Noodle soup cups. Boxed or frozen macaroni and cheese. Self-rising flour. Regular salted crackers. Vegetables Regular canned vegetables. Regular canned tomato sauce and paste. Regular tomato and vegetable juices. Frozen vegetables in sauces. Salted [place] fries. Olives. [place]. Relishes. Sauerkraut. Salsa. Meat and Other Protein Products Salted, canned, smoked, spiced, or pickled meats, seafood, or fish. Bacon, ham, sausage, hot dogs, corned beef, chipped beef, and packaged luncheon meats. Salt pork. Jerky. Pickled herring. Anchovies, regular canned tuna, and sardines. Salted nuts. Dairy Processed cheese and cheese spreads. Cheese curds. Blue cheese and cottage cheese. Buttermilk.  Condiments Onion and garlic salt, seasoned salt, table salt, and sea salt. Canned and packaged gravies. Worcestershire sauce. Tartar sauce. Barbecue sauce. Teriyaki sauce. Soy sauce, including reduced sodium. Steak sauce. Fish sauce. Oyster sauce. Cocktail sauce. Horseradish that you find on the shelf. Regular ketchup and mustard. Meat flavorings and tenderizers. Bouillon cubes. Hot sauce. Tabasco sauce. Marinades. Taco seasonings. Relishes. Fats and Oils Regular salad dressings. Salted butter. Margarine. Ghee. Bacon fat.  Other Potato and tortilla chips. Corn chips and puffs. Salted popcorn and pretzels. Canned or dried soups. Pizza. Frozen entrees and pot pies.  The items listed above may not be a complete list of foods and beverages to avoid. Contact your dietitian for more information.   This information is not intended to replace advice given to you by your health care provider. Make sure you discuss any questions you have with your health care provider.   Document Released: 04/18/2002 Document Revised: 11/17/2014 Document Reviewed: 08/31/2013 Elsevier Interactive Patient Education 2016 [employer] Inc[employer] - Blood Glucose Monitoring, Adult Monitoring your blood glucose (also know as blood sugar) helps you to  manage your diabetes. It also helps you and your health care provider monitor your diabetes and determine how well your treatment plan is working. WHY SHOULD YOU MONITOR YOUR BLOOD GLUCOSE?  It can help you understand how food, exercise, and medicine affect your blood glucose.  It allows you to know what your blood glucose is at any given moment. You can quickly tell if you are having low blood glucose (hypoglycemia) or high blood glucose (hyperglycemia).  It can help you and your health care provider know how to adjust your medicines.  It can help you understand how to manage an illness or adjust medicine for exercise. WHEN SHOULD YOU TEST? Your health care provider will help you decide how often you should check your blood glucose. This may depend on the type of diabetes you have, your diabetes control, or the types of medicines you are taking. Be sure to write down all of your blood glucose readings so that this information can be reviewed with your health care provider. See below for examples of testing times that your health care provider may suggest. Type 1 Diabetes  Test at least 2 times per day if your diabetes is well controlled, if you are using an insulin pump, or if you perform multiple daily injections.  If your diabetes is not well controlled or if you are sick, you may need to test more often.  It is a good idea to also test:  Before every insulin injection.  Before  and after exercise.  Between meals and 2 hours after a meal.  Occasionally between 2:00 a.m. and 3:00 a.m. Type 2 Diabetes  If you are taking insulin, test at least 2 times per day. However, it is best to test before every insulin injection.  If you take medicines by mouth (orally), test 2 times a day.  If you are on a controlled diet, test once a day.  If your diabetes is not well controlled or if you are sick, you may need to monitor more often. HOW TO MONITOR YOUR BLOOD GLUCOSE Supplies  Needed  Blood glucose meter.  Test strips for your meter. Each meter has its own strips. You must use the strips that go with your own meter.  A pricking needle (lancet).  A device that holds the lancet (lancing device).  A journal or log book to write down your results. Procedure  Wash your hands with soap and water. Alcohol is not preferred.  Prick the side of your finger (not the tip) with the lancet.  Gently milk the finger until a small drop of blood appears.  Follow the instructions that come with your meter for inserting the test strip, applying blood to the strip, and using your blood glucose meter. Other Areas to Get Blood for Testing Some meters allow you to use other areas of your body (other than your finger) to test your blood. These areas are called alternative sites. The most common alternative sites are:  The forearm.  The thigh.  The back area of the lower leg.  The palm of the hand. The blood flow in these areas is slower. Therefore, the blood glucose values you get may be delayed, and the numbers are different from what you would get from your fingers. Do not use alternative sites if you think you are having hypoglycemia. Your reading will not be accurate. Always use a finger if you are having hypoglycemia. Also, if you cannot feel your lows (hypoglycemia unawareness), always use your fingers for your blood glucose checks. ADDITIONAL TIPS FOR GLUCOSE MONITORING  Do not reuse lancets.  Always carry your supplies with you.  All blood glucose meters have a 24-hour "hotline" number to call if you have questions or need help.  Adjust (calibrate) your blood glucose meter with a control solution after finishing a few boxes of strips. BLOOD GLUCOSE RECORD KEEPING It is a good idea to keep a daily record or log of your blood glucose readings. Most glucose meters, if not all, keep your glucose records stored in the meter. Some meters come with the ability to download  your records to your home computer. Keeping a record of your blood glucose readings is especially helpful if you are wanting to look for patterns. Make notes to go along with the blood glucose readings because you might forget what happened at that exact time. Keeping good records helps you and your health care provider to work together to achieve good diabetes management.    This information is not intended to replace advice given to you by your health care provider. Make sure you discuss any questions you have with your health care provider.   Document Released: 10/30/2003 Document Revised: 11/17/2014 Document Reviewed: 03/21/2013 Elsevier Interactive Patient Education 2016 [employer].  - Diabetes and Exercise Exercising regularly is important. It is not just about losing weight. It has many health benefits, such as:  Improving your overall fitness, flexibility, and endurance.  Increasing your bone density.  Helping with weight  control.  Decreasing your body fat.  Increasing your muscle strength.  Reducing stress and tension.  Improving your overall health. People with diabetes who exercise gain additional benefits because exercise:  Reduces appetite.  Improves the body's use of blood sugar (glucose).  Helps lower or control blood glucose.  Decreases blood pressure.  Helps control blood lipids (such as cholesterol and triglycerides).  Improves the body's use of the hormone insulin by:  Increasing the body's insulin sensitivity.  Reducing the body's insulin needs.  Decreases the risk for heart disease because exercising:  Lowers cholesterol and triglycerides levels.  Increases the levels of good cholesterol (such as high-density lipoproteins [HDL]) in the body.  Lowers blood glucose levels. YOUR ACTIVITY PLAN  Choose an activity that you enjoy, and set realistic goals. To exercise safely, you should begin practicing any new physical activity slowly, and gradually  increase the intensity of the exercise over time. Your health care provider or diabetes educator can help create an activity plan that works for you. General recommendations include:  Encouraging children to engage in at least 60 minutes of physical activity each day.  Stretching and performing strength training exercises, such as yoga or weight lifting, at least 2 times per week.  Performing a total of at least 150 minutes of moderate-intensity exercise each week, such as brisk walking or water aerobics.  Exercising at least 3 days per week, making sure you allow no more than 2 consecutive days to pass without exercising.  Avoiding long periods of inactivity (90 minutes or more). When you have to spend an extended period of time sitting down, take frequent breaks to walk or stretch. RECOMMENDATIONS FOR EXERCISING WITH TYPE 1 OR TYPE 2 DIABETES   Check your blood glucose before exercising. If blood glucose levels are greater than 240 mg/dL, check for urine ketones. Do not exercise if ketones are present.  Avoid injecting insulin into areas of the body that are going to be exercised. For example, avoid injecting insulin into:  The arms when playing tennis.  The legs when jogging.  Keep a record of:  Food intake before and after you exercise.  Expected peak times of insulin action.  Blood glucose levels before and after you exercise.  The type and amount of exercise you have done.  Review your records with your health care provider. Your health care provider will help you to develop guidelines for adjusting food intake and insulin amounts before and after exercising.  If you take insulin or oral hypoglycemic agents, watch for signs and symptoms of hypoglycemia. They include:  Dizziness.  Shaking.  Sweating.  Chills.  Confusion.  Drink plenty of water while you exercise to prevent dehydration or heat stroke. Body water is lost during exercise and must be replaced.  Talk to  your health care provider before starting an exercise program to make sure it is safe for you. Remember, almost any type of activity is better than none.   This information is not intended to replace advice given to you by your health care provider. Make sure you discuss any questions you have with your health care provider.   Document Released: 01/17/2004 Document Revised: 03/13/2015 Document Reviewed: 04/05/2013 Elsevier Interactive Patient Education 2016 Elsevier Inc.  - Diabetes Mellitus and Food It is important for you to manage your blood sugar (glucose) level. Your blood glucose level can be greatly affected by what you eat. Eating healthier foods in the appropriate amounts throughout the day at about the same time  each day will help you control your blood glucose level. It can also help slow or prevent worsening of your diabetes mellitus. Healthy eating may even help you improve the level of your blood pressure and reach or maintain a healthy weight.  General recommendations for healthful eating and cooking habits include:  Eating meals and snacks regularly. Avoid going long periods of time without eating to lose weight.  Eating a diet that consists mainly of plant-based foods, such as fruits, vegetables, nuts, legumes, and whole grains.  Using low-heat cooking methods, such as baking, instead of high-heat cooking methods, such as deep frying. Work with your dietitian to make sure you understand how to use the Nutrition Facts information on food labels. HOW CAN FOOD AFFECT ME? Carbohydrates Carbohydrates affect your blood glucose level more than any other type of food. Your dietitian will help you determine how many carbohydrates to eat at each meal and teach you how to count carbohydrates. Counting carbohydrates is important to keep your blood glucose at a healthy level, especially if you are using insulin or taking certain medicines for diabetes mellitus. Alcohol Alcohol can cause  sudden decreases in blood glucose (hypoglycemia), especially if you use insulin or take certain medicines for diabetes mellitus. Hypoglycemia can be a life-threatening condition. Symptoms of hypoglycemia (sleepiness, dizziness, and disorientation) are similar to symptoms of having too much alcohol.  If your health care provider has given you approval to drink alcohol, do so in moderation and use the following guidelines:  Women should not have more than one drink per day, and men should not have more than two drinks per day. One drink is equal to:  12 oz of beer.  5 oz of wine.  1 oz of hard liquor.  Do not drink on an empty stomach.  Keep yourself hydrated. Have water, diet soda, or unsweetened iced tea.  Regular soda, juice, and other mixers might contain a lot of carbohydrates and should be counted. WHAT FOODS ARE NOT RECOMMENDED? As you make food choices, it is important to remember that all foods are not the same. Some foods have fewer nutrients per serving than other foods, even though they might have the same number of calories or carbohydrates. It is difficult to get your body what it needs when you eat foods with fewer nutrients. Examples of foods that you should avoid that are high in calories and carbohydrates but low in nutrients include:  Trans fats (most processed foods list trans fats on the Nutrition Facts label).  Regular soda.  Juice.  Candy.  Sweets, such as cake, pie, doughnuts, and cookies.  Fried foods. WHAT FOODS CAN I EAT? Eat nutrient-rich foods, which will nourish your body and keep you healthy. The food you should eat also will depend on several factors, including:  The calories you need.  The medicines you take.  Your weight.  Your blood glucose level.  Your blood pressure level.  Your cholesterol level. You should eat a variety of foods, including:  Protein.  Lean cuts of meat.  Proteins low in saturated fats, such as fish, egg whites,  and beans. Avoid processed meats.  Fruits and vegetables.  Fruits and vegetables that may help control blood glucose levels, such as apples, mangoes, and yams.  Dairy products.  Choose fat-free or low-fat dairy products, such as milk, yogurt, and cheese.  Grains, bread, pasta, and rice.  Choose whole grain products, such as multigrain bread, whole oats, and brown rice. These foods may help control  blood pressure.  Fats.  Foods containing healthful fats, such as nuts, avocado, olive oil, canola oil, and fish. DOES EVERYONE WITH DIABETES MELLITUS HAVE THE SAME MEAL PLAN? Because every person with diabetes mellitus is different, there is not one meal plan that works for everyone. It is very important that you meet with a dietitian who will help you create a meal plan that is just right for you.   This information is not intended to replace advice given to you by your health care provider. Make sure you discuss any questions you have with your health care provider.   Document Released: 07/24/2005 Document Revised: 11/17/2014 Document Reviewed: 09/23/2013 Elsevier Interactive Patient Education 2016 Elsevier Inc.   - Type 2 Diabetes Mellitus, Adult Type 2 diabetes mellitus is a long-term (chronic) disease. In type 2 diabetes:  The pancreas does not make enough of a hormone called insulin.  The cells in the body do not respond as well to the insulin that is made.  Both of the above can happen. Normally, insulin moves sugars from food into tissue cells. This gives you energy. If you have type 2 diabetes, sugars cannot be moved into tissue cells. This causes high blood sugar (hyperglycemia).  Your doctors will set personal treatment goals for you based on your age, your medicines, how long you have had diabetes, and any other medical conditions you have. Generally, the goal of treatment is to maintain the following blood glucose levels:  Before meals (preprandial): 80-130  mg/dL.  After meals (postprandial): below 180 mg/dL.  A1c: less than 6.5-7%. HOME CARE  Have your hemoglobin A1c level checked twice a year. The level shows if your diabetes is under control or out of control.  Test your blood sugar level every day as told by your doctor.  Check your ketone levels by testing your pee (urine) when you are sick and as told.  Take your diabetes or insulin medicine as told by your doctor.  Never run out of insulin.  Adjust how much insulin you give yourself based on how many carbs (carbohydrates) you eat. Carbs are in many foods, such as fruits, vegetables, whole grains, and dairy products.  Have a healthy snack between every healthy meal. Have 3 meals and 3 snacks a day.  Lose weight if you are overweight.  Carry a medical alert card or wear your medical alert jewelry.  Carry a 15-gram carb snack with you at all times. Examples include:  Glucose pills, 3 or 4.  Glucose gel, 15-gram tube.  Raisins, 2 tablespoons (24 grams).  Jelly beans, 6.  Animal crackers, 8.  Regular (not diet) pop, 4 ounces (120 milliliters).  Gummy treats, 9.  Notice low blood sugar (hypoglycemia) symptoms, such as:  Shaking (tremors).  Trouble thinking clearly.  Sweating.  Faster heart rate.  Headache.  Dry mouth.  Hunger.  Crabbiness (irritability).  Being worried or tense (anxious).  Restless sleep.  A change in speech or coordination.  Confusion.  Treat low blood sugar right away. If you are alert and can swallow, follow the 15:15 rule:  Take 15-20 grams of a rapid-acting glucose or carb. This includes glucose gel, glucose pills, or 4 ounces (120 milliliters) of fruit juice, regular pop, or low-fat milk.  Check your blood sugar level 15 minutes after taking the glucose.  Take 15-20 grams more of glucose if the repeat blood sugar level is still 70 mg/dL (milligrams/deciliter) or below.  Eat a meal or snack within 1 hour of the  blood sugar  levels going back to normal.  Notice early symptoms of high blood sugar, such as:  Being really thirsty or drinking a lot (polydipsia).  Peeing a lot (polyuria).  Do at least 150 minutes of physical activity a week or as told.  Split the 150 minutes of activity up during the week. Do not do 150 minutes of activity in one day.  Perform exercises, such as weight lifting, at least 2 times a week or as told.  Spend no more than 90 minutes at one time inactive.  Adjust your insulin or food intake as needed if you start a new exercise or sport.  Follow your sick-day plan when you are not able to eat or drink as usual.  Do not smoke, chew tobacco, or use electronic cigarettes.  Women who are not pregnant should drink no more than 1 drink a day. Men should drink no more than 2 drinks a day.  Only drink alcohol with food.  Ask your doctor if alcohol is safe for you.  Tell your doctor if you drink alcohol several times during the week.  See your doctor regularly.  Schedule an eye exam soon after you are told you have diabetes. Schedule exams once every year.  Check your skin and feet every day. Check for cuts, bruises, redness, nail problems, bleeding, blisters, or sores. A doctor should do a foot exam once a year.  Brush your teeth and gums twice a day. Floss once a day. Visit your dentist regularly.  Share your diabetes plan with your workplace or school.  Keep your shots that fight diseases (vaccines) up to date.  Get a flu (influenza) shot every year.  Get a pneumonia shot. If you are 32 years of age or older and you have never gotten a pneumonia shot, you might need to get two shots.  Ask your doctor which other shots you should get.  Learn how to deal with stress.  Get diabetes education and support as needed.  Ask your doctor for special help if:  You need help to maintain or improve how you do things on your own.  You need help to maintain or improve the quality  of your life.  You have foot or hand problems.  You have trouble cleaning yourself, dressing, eating, or doing physical activity. GET HELP IF:  You are unable to eat or drink for more than 6 hours.  You feel sick to your stomach (nauseous) or throw up (vomit) for more than 6 hours.  Your blood sugar level is over 240 mg/dL.  There is a change in mental status.  You get another serious illness.  You have watery poop (diarrhea) for more than 6 hours.  You have been sick or have had a fever for 2 or more days and are not getting better.  You have pain when you are active. GET HELP RIGHT AWAY IF:  You have trouble breathing.  Your ketone levels are higher than your doctor says they should be. MAKE SURE YOU:  Understand these instructions.  Will watch your condition.  Will get help right away if you are not doing well or get worse.   This information is not intended to replace advice given to you by your health care provider. Make sure you discuss any questions you have with your health care provider.   Document Released: 08/05/2008 Document Revised: 03/13/2015 Document Reviewed: 05/28/2012 Elsevier Interactive Patient Education [employer]

## 2016-03-17 NOTE — Progress Notes (Signed)
Patient is here to Establish Care for HTN  Patient denies pain at this time.  Patient complains of intermittent pain in her lower extremities.  Patient has taken medication today. Patient has not eaten today.

## 2016-03-18 ENCOUNTER — Other Ambulatory Visit: Payer: Self-pay | Admitting: Internal Medicine

## 2016-03-18 ENCOUNTER — Ambulatory Visit (INDEPENDENT_AMBULATORY_CARE_PROVIDER_SITE_OTHER): Payer: Medicaid Other | Admitting: Pharmacist

## 2016-03-18 ENCOUNTER — Telehealth: Payer: Self-pay | Admitting: Pharmacist

## 2016-03-18 ENCOUNTER — Telehealth: Payer: Self-pay | Admitting: *Deleted

## 2016-03-18 VITALS — BP 148/92 | HR 77

## 2016-03-18 DIAGNOSIS — E785 Hyperlipidemia, unspecified: Secondary | ICD-10-CM

## 2016-03-18 DIAGNOSIS — I10 Essential (primary) hypertension: Secondary | ICD-10-CM

## 2016-03-18 DIAGNOSIS — I110 Hypertensive heart disease with heart failure: Secondary | ICD-10-CM | POA: Diagnosis not present

## 2016-03-18 MED ORDER — ATORVASTATIN CALCIUM 40 MG PO TABS: 40.0000 mg | ORAL_TABLET | Freq: Every day | ORAL | Status: DC

## 2016-03-18 MED ORDER — CHLORTHALIDONE 25 MG PO TABS: 25.0000 mg | ORAL_TABLET | Freq: Every day | ORAL | Status: DC

## 2016-03-18 MED ORDER — SIMVASTATIN 40 MG PO TABS: 40.0000 mg | ORAL_TABLET | Freq: Every day | ORAL | Status: DC

## 2016-03-18 MED FILL — ?ATORVASTATIN 40MG TABLET: 40 | 30 days supply | Qty: 30 | Fill #0

## 2016-03-18 MED FILL — ?CHLORTHALIDONE 25 MG TABLE: 25 | 30 days supply | Qty: 30 | Fill #0

## 2016-03-18 NOTE — Telephone Encounter (Signed)
-----   Message from Pete Glatter, MD sent at 03/18/2016  9:21 AM EDT ----- Please call w/ results.  Thyroid normal range, no treatment.  Kidney function still looking bad, but bit better than 1 month ago.  Need to keep those U/S studies and f/u w/ renal md.   Cholesterol all bad ranges.  To address this please limit saturated fat to no more than 7% of your calories, limit cholesterol to 200 mg/day, increase fiber and exercise as tolerated .I started her on cholesterol lowering medication to your regimen, simvastatin 40 day.  thanks

## 2016-03-18 NOTE — Telephone Encounter (Signed)
Received a call from Sherle Poe, PharmD, who is seeing the patient today with Margaretmary Dys, PharmD. Patient on metformin with renal impairment. Will stop metformin. Patient will be told at visit today.

## 2016-03-18 NOTE — Progress Notes (Signed)
Patient ID: Andrea Clements                 DOB: 27-Sep-1966                      MRN: [id number]     HPI: Andrea Clements is a 50 y.o. female referred by Dr. Burt Knack to HTN clinic who presents today for follow up. PMH significant for HTN, pre-DM, CVA, cardiomyopathy, and CKD stage 3.  Pt has only been taking her hydralazine BID rather than TID because TID dosing gave her headaches. Pt recently established with PCP at [facility]. Checks BP at home 2-3 times per week.   Current HTN meds: Hydralazine 69m BID, indapamide 2.520mdaily, spironolactone 2552maily, isosorbide mononitrate 16m64mily, amlodipine 10mg40mly Previously tried: Clonidine patch and pill (dry mouth, lethargy, and skin irritation), ACEi (anaphylaxis) BP goal: <140/90mmH35mamily History: Mother with HTN and cancer, father with HTN, all 4 grandparents with HTN  Social History: Former smoker - 1/2 PPD for 30 years. Quit 07/10/2015. Pt reports that she occasionally drinks alcohol and does not use illicit drugs.  Diet: Does not add salt to food. Eats black beans and rice mainly. Eats 1 large meal in the evening.  Exercise: Walks 0.5 miles a few times a month at a leisurely pace.   Home BP readings: SBP generally in the 140s; unsure of DBP readings  Wt Readings from Last 3 Encounters:  03/17/16 279 lb (126.554 kg)  02/26/16 281 lb 4 oz (127.574 kg)  02/05/16 281 lb (127.461 kg)   BP Readings from Last 3 Encounters:  03/17/16 146/86  02/26/16 154/92  02/05/16 140/77   Pulse Readings from Last 3 Encounters:  03/17/16 67  02/26/16 63  02/05/16 76    Renal function: Estimated Creatinine Clearance: 41.3 mL/min (by C-G formula based on Cr of 2.11).  Past Medical History  Diagnosis Date  . Hypertension   . Asthma   . Cardiomyopathy (HCC)  [place] 09/2014 Echo: EF 30-35%, inflat, inf, infsept [place], Gr 2 DD-->No ischemic w/u 2/2 recent CVA and body habitus.  . CVA (cerebral infarction)     a. Acute  sub cm LEFT basal ganglia/internal capsule lacunar infarct, normal MRA.  . Morbid obesity (HCC)  [place]   . CKD (chronic kidney disease), stage III   . St[facility] (HCC) [facility]  Medication Sig Dispense Refill  . albuterol (PROVENTIL HFA;VENTOLIN HFA) 108 (90 BASE) MCG/ACT inhaler Inhale 2 puffs into the lungs every 6 (six) hours as needed for wheezing or shortness of breath. 1 Inhaler 1  . amLODipine (NORVASC) 10 MG tablet Take 1 tablet (10 mg total) by mouth daily. 90 tablet 3  . Blood Glucose Monitoring Suppl (TRUE METRIX GO GLUCOSE METER) w/Device KIT 1 each by Does not apply route every 8 (eight) hours as needed. 1 kit 0  . clopidogrel (PLAVIX) 75 MG tablet Take 1 tablet (75 mg total) by mouth daily with breakfast. 90 tablet 3  . clotrimazole (LOTRIMIN) 1 % cream Apply 1 application topically 2 (two) times daily. (Patient not taking: Reported on 02/05/2016) 30 g 0  . glucose blood test strip Use as instructed 100 each 12  . hydrALAZINE (APRESOLINE) 25 MG tablet Take 1 tablet (25 mg total) by mouth 3 (three) times daily. (Patient taking differently: Take 25 mg by mouth daily. ) 90  tablet 3  . indapamide (LOZOL) 2.5 MG tablet Take 1 tablet (2.5 mg total) by mouth daily. (Patient not taking: Reported on 02/05/2016) 90 tablet 3  . isosorbide mononitrate (IMDUR) 30 MG 24 hr tablet Take 1 tablet (30 mg total) by mouth daily. 90 tablet 3  . metoprolol succinate (TOPROL-XL) 100 MG 24 hr tablet Take 1 tablet (100 mg total) by mouth daily. Take with or immediately following a meal. 90 tablet 3  . pantoprazole (PROTONIX) 40 MG tablet Take 1 tablet (40 mg total) by mouth daily. (Patient taking differently: Take 40 mg by mouth daily as needed (heartburn). ) 30 tablet 3  . potassium chloride SA (K-DUR,KLOR-CON) 20 MEQ tablet Take 1 tablet (20 mEq total) by mouth daily. 90 tablet 3  . spironolactone (ALDACTONE) 25 MG tablet Take 1 tablet (25 mg total)  by mouth daily. 90 tablet 3  . TRUEPLUS LANCETS 26G MISC 1 each by Does not apply route every 8 (eight) hours as needed. 100 each 12   No current facility-administered medications on file prior to visit.    Allergies  Allergen Reactions  . Altace [Ramipril] Anaphylaxis  . Aspirin Anaphylaxis     Assessment/Plan:  1. Hypertension - BP slightly improved to 148/104mHg but still above goal < 140/955mg. Advised pt to titrate up her hydralazine as much as tolerated. She will first try 2540mID, if she develops a headache will try 28m68mD since she has been doing better on BID dosing. If not, will continue on current 25mg34m dosing. Will also d/c indapamide 2.5mg a56mstart chlorthalidone 25mg d78m since dose titration safer with chlorthalidone given CKD4. Will f/u in HTN clinic in 3 weeks.  2. Hyperlipidemia - LDL 150 above goal < 70mg/dL2men hx of stroke. Pt was started on simvastatin 40mg yes97may but had not picked up yet. Drug interaction with amlodipine where max dose simvastatin is 20mg. Wil85mc and start atorvastatin 40mg to av57minteraction and for better lipid lowering and safety in CKD. Communicated this change with [facility]. F/u lipid panel in 3 months.  3. Pre-DM - Pt was prescribed metformin 1g BID yesterday. Upon reviewing labs, eGFR 30 and metformin contraindicated. Communicated this to [facility]'ed metformin, pt is aware. Will work on diet and exercise to help control pre-DM for now and f/u with PCP as needed.

## 2016-03-18 NOTE — Patient Instructions (Addendum)
It was nice to see you today in clinic.  Please do not take your metformin or simvastatin. Pick up prescription for atorvastatin 40mg  to start taking once daily. Stop taking indapamide. Pick up prescription for chlorthalidone 25mg  to start taking once daily in the morning. Try to increase your hydralazine to 25mg  3 times a day. If this gives you a headache, try taking 2 of the tablets twice a day. If this still gives you a headache, it is ok to continue taking it 25mg  twice a day as you have been.  Continue to make progress with controlling your portion sizes. Try not to skip meals and start adding more leafy green vegetables and fiber to your diet. You're moving in the right direction!  Recheck blood pressure in 3 weeks.

## 2016-03-18 NOTE — Telephone Encounter (Signed)
Good catch. Thank you. Please call pt and tell her to not start the metformin,

## 2016-03-18 NOTE — Telephone Encounter (Signed)
Patient verified DOB Patient is aware of thyroid being normal and not requiring treatment. Patient is also aware of Cholesterol being out of range and kidney function being abnormal though better than 1 month ago. Patient advised to limit saturated fat in take and increase fiber and exercise as tolerated. Patient is aware of atorvastatin being ordered at [facility] pharmacy for patient to take once daily. Patient expressed her understanding and had no further questions at this time.

## 2016-03-19 ENCOUNTER — Ambulatory Visit (HOSPITAL_COMMUNITY)
Admission: RE | Admit: 2016-03-19 | Discharge: 2016-03-19 | Disposition: A | Discharge status: Home / Self Care | Payer: Medicaid Other | Source: Ambulatory Visit | Attending: Internal Medicine | Admitting: Internal Medicine

## 2016-03-19 DIAGNOSIS — N184 Chronic kidney disease, stage 4 (severe): Secondary | ICD-10-CM | POA: Insufficient documentation

## 2016-03-19 DIAGNOSIS — I129 Hypertensive chronic kidney disease with stage 1 through stage 4 chronic kidney disease, or unspecified chronic kidney disease: Secondary | ICD-10-CM | POA: Insufficient documentation

## 2016-03-19 NOTE — Progress Notes (Signed)
*  PRELIMINARY RESULTS* Vascular Ultrasound Renal Artery Duplex has been completed.   Study was technically difficult and limited due to patient body habitus and overlying bowel gas. There is no obvious evidence of hemodynamically significant renal artery stenosis bilaterally involving visualized segments.  03/19/2016 10:07 AM Gertie Fey, RVT, RDCS, RDMS

## 2016-03-24 ENCOUNTER — Ambulatory Visit (HOSPITAL_COMMUNITY): Payer: Medicaid Other

## 2016-03-31 ENCOUNTER — Ambulatory Visit (HOSPITAL_COMMUNITY): Payer: Medicaid Other

## 2016-04-08 ENCOUNTER — Encounter: Payer: Self-pay | Admitting: Pharmacist

## 2016-04-08 ENCOUNTER — Ambulatory Visit (INDEPENDENT_AMBULATORY_CARE_PROVIDER_SITE_OTHER): Payer: Medicaid Other | Admitting: Pharmacist

## 2016-04-08 VITALS — BP 138/86 | HR 75 | Wt 278.0 lb

## 2016-04-08 DIAGNOSIS — I110 Hypertensive heart disease with heart failure: Secondary | ICD-10-CM | POA: Diagnosis not present

## 2016-04-08 DIAGNOSIS — I10 Essential (primary) hypertension: Secondary | ICD-10-CM | POA: Diagnosis not present

## 2016-04-08 LAB — BASIC METABOLIC PANEL
BUN: 34 mg/dL — ABNORMAL HIGH (ref 7–25)
CHLORIDE: 102 mmol/L (ref 98–110)
CO2: 27 mmol/L (ref 20–31)
CREATININE: 2.42 mg/dL — AB (ref 0.50–1.10)
Calcium: 9 mg/dL (ref 8.6–10.2)
Glucose, Bld: 94 mg/dL (ref 65–99)
Potassium: 3.8 mmol/L (ref 3.5–5.3)
Sodium: 139 mmol/L (ref 135–146)

## 2016-04-08 MED ORDER — HYDRALAZINE HCL 50 MG PO TABS: 50.0000 mg | ORAL_TABLET | Freq: Two times a day (BID) | ORAL | Status: DC

## 2016-04-08 MED ORDER — HYDRALAZINE HCL 25 MG PO TABS: 50.0000 mg | ORAL_TABLET | Freq: Two times a day (BID) | ORAL | Status: DC

## 2016-04-08 NOTE — Progress Notes (Signed)
Patient ID: Andrea Clements                 DOB: 26-Aug-1966                      MRN: [id number]     HPI: Andrea Clements is a 50 y.o. female referred by Dr. Burt Knack to HTN clinic who presents today for follow up. PMH significant for HTN, pre-DM, CVA, cardiomyopathy, and CKD stage 3.  Pt was instructed at last visit (03/18/16) to increase hydralazine as tolerated to 8m TID (559mBID if unable to tolerate) and start taking chlorthalidone 2567maily. Able to tolerate hydralazine 45m26mD only. Reports some dizziness when rising but episodes are infrequent and of short duration. Also reports urinating more often and occasional visual blurriness. Wears glasses for reading and driving. Checking BP at home 2-3 times per week. Reports SBP now in the 130s-140s; unsure of DBP readings.    Current HTN meds: Hydralazine 45mg20m, chlorthalidone 25mg 4my, spironolactone 25mg d48m, isosorbide mononitrate 30mg da74m amlodipine 10mg dai21mToprol 100mg dail48meviously tried: Clonidine patch and pill (dry mouth, lethargy, and skin irritation), ACEi (anaphylaxis) BP goal: <140/90mmHg  Fa32m History: Mother with HTN and cancer, father with HTN, all 4 grandparents with HTN  Social History: Former smoker - 1/2 PPD for 30 years. Quit 07/10/2015. Pt reports that she occasionally drinks alcohol and does not use illicit drugs.  Diet: Does not add salt to food. Eats black beans and rice mainly. Eats 1 large meal in the evening.  Exercise: Walks 0.5 miles a few times a month at a leisurely pace.    Wt Readings from Last 3 Encounters:  03/17/16 279 lb (126.554 kg)  02/26/16 281 lb 4 oz (127.574 kg)  02/05/16 281 lb (127.461 kg)   BP Readings from Last 3 Encounters:  04/08/16 138/86  03/18/16 148/92  03/17/16 146/86   Pulse Readings from Last 3 Encounters:  04/08/16 75  03/18/16 77  03/17/16 67    Renal function: CrCl cannot be calculated (Unknown ideal weight.).  Past Medical History  Diagnosis  Date  . Hypertension   . Asthma   . Cardiomyopathy (HCC)     a.[place]/2015 Echo: EF 30-35%, inflat, inf, infsept AK, Gr 2 DD-->No ischemic w/u 2/2 recent CVA and body habitus.  . CVA (cerebral infarction)     a. Acute sub cm LEFT basal ganglia/internal capsule lacunar infarct, normal MRA.  . Morbid obesity (HCC)   . [place]   . CKD (chronic kidney disease), stage III   . Stroke (HCC)     [facility]  Medication Sig Dispense Refill  . albuterol (PROVENTIL HFA;VENTOLIN HFA) 108 (90 BASE) MCG/ACT inhaler Inhale 2 puffs into the lungs every 6 (six) hours as needed for wheezing or shortness of breath. 1 Inhaler 1  . amLODipine (NORVASC) 10 MG tablet Take 1 tablet (10 mg total) by mouth daily. 90 tablet 3  . atorvastatin (LIPITOR) 40 MG tablet Take 1 tablet (40 mg total) by mouth daily. 30 tablet 11  . Blood Glucose Monitoring Suppl (TRUE METRIX GO GLUCOSE METER) w/Device KIT 1 each by Does not apply route every 8 (eight) hours as needed. 1 kit 0  . chlorthalidone (HYGROTON) 25 MG tablet Take 1 tablet (25 mg total) by mouth daily. 30 tablet 11  . clopidogrel (PLAVIX) 75 MG tablet Take 1 tablet (75 mg total) by mouth daily with breakfast.  90 tablet 3  . clotrimazole (LOTRIMIN) 1 % cream Apply 1 application topically 2 (two) times daily. (Patient not taking: Reported on 02/05/2016) 30 g 0  . glucose blood test strip Use as instructed 100 each 12  . isosorbide mononitrate (IMDUR) 30 MG 24 hr tablet Take 1 tablet (30 mg total) by mouth daily. 90 tablet 3  . metoprolol succinate (TOPROL-XL) 100 MG 24 hr tablet Take 1 tablet (100 mg total) by mouth daily. Take with or immediately following a meal. 90 tablet 3  . pantoprazole (PROTONIX) 40 MG tablet Take 1 tablet (40 mg total) by mouth daily. (Patient taking differently: Take 40 mg by mouth daily as needed (heartburn). ) 30 tablet 3  . potassium chloride SA (K-DUR,KLOR-CON) 20 MEQ tablet Take 1 tablet (20  mEq total) by mouth daily. 90 tablet 3  . spironolactone (ALDACTONE) 25 MG tablet Take 1 tablet (25 mg total) by mouth daily. 90 tablet 3  . TRUEPLUS LANCETS 26G MISC 1 each by Does not apply route every 8 (eight) hours as needed. 100 each 12   No current facility-administered medications on file prior to visit.    Allergies  Allergen Reactions  . Altace [Ramipril] Anaphylaxis  . Aspirin Anaphylaxis     Assessment/Plan:  1. HTN: BP today 138/86 at goal of <140/90 mmHg. Overall tolerating new regimen well; experiencing some dizziness when rising although infrequent and of short duration. Will order BMET today to direct further BP medication titration. Encouraged pt to stay well hydrated as she is now urinating more frequently without increased thirst. Follow up clinic visit in one month.  Stephens November, PharmD Clinical Pharmacy Resident 2:47 PM, 04/08/2016

## 2016-04-08 NOTE — Patient Instructions (Addendum)
Continue taking your blood pressure medications as you currently are. Your new medication list is attached.  We will make further changes to your blood pressure medications based on your lab results.   See you again in one month!

## 2016-04-11 ENCOUNTER — Ambulatory Visit (HOSPITAL_COMMUNITY): Payer: Medicaid Other

## 2016-04-14 MED FILL — CHLORTHALIDONE 25 MG TABLET: 25 | 30 days supply | Qty: 30 | Fill #1

## 2016-04-14 MED FILL — ?ATORVASTATIN 40MG TABLET: 40 | 30 days supply | Qty: 30 | Fill #1

## 2016-04-21 ENCOUNTER — Ambulatory Visit (HOSPITAL_COMMUNITY): Payer: Medicaid Other

## 2016-05-01 ENCOUNTER — Ambulatory Visit (HOSPITAL_BASED_OUTPATIENT_CLINIC_OR_DEPARTMENT_OTHER): Payer: Medicaid Other | Attending: Physician Assistant

## 2016-05-06 ENCOUNTER — Encounter: Payer: Medicaid Other | Admitting: Pharmacist

## 2016-05-06 NOTE — Progress Notes (Signed)
Patient ID: Andrea Clements                 DOB: 1966-05-28                      MRN: [id number]     HPI: Andrea Clements is a 50 y.o. female referred by Dr. Burt Knack to HTN clinic who presents today for follow up. PMH significant for HTN, pre-DM, CVA, cardiomyopathy, and CKD stage 3.  Able to tolerate hydralazine 27m BID only. Reports some dizziness when rising but episodes are infrequent and of short duration. Also reports urinating more often and occasional visual blurriness. Wears glasses for reading and driving. Checking BP at home 2-3 times per week. Reports SBP now in the 130s-140s; unsure of DBP readings.   [place] [facility] [place] [facility] C[place]al Maes Pt had a renal artery scan done on 03/19/16 which did not show any significant renal artery stenosis. MD suspects hypertensive nephrosclerosis since study was technically difficult. Reported need for BP control to delay further worsening of kidney function. Pt has been CKD stage 3 borderline 4 for the past year, however most recent SCr from 03/17/16 improved to 2.11 with GFR of 31.  Current HTN meds: Hydralazine 516mBID, chlorthalidone 2543maily, spironolactone 62m71mily, isosorbide mononitrate 30mg49mly, amlodipine 10mg 41my, Toprol 100mg d16m Previously tried: Clonidine patch and pill (dry mouth, lethargy, and skin irritation), ACEi (anaphylaxis) BP goal: <140/90mmHg 51mily History: Mother with HTN and cancer, father with HTN, all 4 grandparents with HTN  Social History: Former smoker - 1/2 PPD for 30 years. Quit 07/10/2015. Pt reports that she occasionally drinks alcohol and does not use illicit drugs.  Diet: Does not add salt to food. Eats black beans and rice mainly. Eats 1 large meal in the evening.  Exercise: Walks 0.5 miles a few times a month at a leisurely pace.  Wt Readings from Last 3 Encounters:  04/08/16 278 lb (126.1 kg)  03/17/16 279 lb (126.554 kg)  02/26/16 281 lb 4 oz  (127.574 kg)   BP Readings from Last 3 Encounters:  04/08/16 138/86  03/18/16 148/92  03/17/16 146/86   Pulse Readings from Last 3 Encounters:  04/08/16 75  03/18/16 77  03/17/16 67    Renal function: CrCl cannot be calculated (Unknown ideal weight.).  Past Medical History  Diagnosis Date  . Hypertension   . Asthma   . Cardiomyopathy (HCC)    [place] 09/2014 Echo: EF 30-35%, inflat, inf, infsept AK, Gr 2 DD-->No ischemic w/u 2/2 recent CVA and body habitus.  . CVA (cerebral infarction)     a. Acute sub cm LEFT basal ganglia/internal capsule lacunar infarct, normal MRA.  . Morbid obesity (HCC)   .[place] abuse   . CKD (chronic kidney disease), stage III   . Stroke (HCC)   [facility] Prior to Visit  Medication Sig Dispense Refill  . albuterol (PROVENTIL HFA;VENTOLIN HFA) 108 (90 BASE) MCG/ACT inhaler Inhale 2 puffs into the lungs every 6 (six) hours as needed for wheezing or shortness of breath. 1 Inhaler 1  . amLODipine (NORVASC) 10 MG tablet Take 1 tablet (10 mg total) by mouth daily. 90 tablet 3  . atorvastatin (LIPITOR) 40 MG tablet Take 1 tablet (40 mg total) by mouth daily. 30 tablet 11  . Blood Glucose Monitoring Suppl (TRUE METRIX GO GLUCOSE METER) w/Device KIT 1 each by Does  not apply route every 8 (eight) hours as needed. 1 kit 0  . chlorthalidone (HYGROTON) 25 MG tablet Take 1 tablet (25 mg total) by mouth daily. 30 tablet 11  . clopidogrel (PLAVIX) 75 MG tablet Take 1 tablet (75 mg total) by mouth daily with breakfast. 90 tablet 3  . clotrimazole (LOTRIMIN) 1 % cream Apply 1 application topically 2 (two) times daily. (Patient not taking: Reported on 02/05/2016) 30 g 0  . glucose blood test strip Use as instructed 100 each 12  . hydrALAZINE (APRESOLINE) 50 MG tablet Take 1 tablet (50 mg total) by mouth 2 (two) times daily. 60 tablet 11  . isosorbide mononitrate (IMDUR) 30 MG 24 hr tablet Take 1 tablet (30 mg total) by mouth daily. 90  tablet 3  . metoprolol succinate (TOPROL-XL) 100 MG 24 hr tablet Take 1 tablet (100 mg total) by mouth daily. Take with or immediately following a meal. 90 tablet 3  . pantoprazole (PROTONIX) 40 MG tablet Take 1 tablet (40 mg total) by mouth daily. (Patient taking differently: Take 40 mg by mouth daily as needed (heartburn). ) 30 tablet 3  . potassium chloride SA (K-DUR,KLOR-CON) 20 MEQ tablet Take 1 tablet (20 mEq total) by mouth daily. 90 tablet 3  . spironolactone (ALDACTONE) 25 MG tablet Take 1 tablet (25 mg total) by mouth daily. 90 tablet 3  . TRUEPLUS LANCETS 26G MISC 1 each by Does not apply route every 8 (eight) hours as needed. 100 each 12   No current facility-administered medications on file prior to visit.    Allergies  Allergen Reactions  . Altace [Ramipril] Anaphylaxis  . Aspirin Anaphylaxis     Assessment/Plan:  1. HTN - BP at goal < 140/20mHg for 2 consecutive clinic visits. No med changes today. Pt will f/u as needed in HTN clinic.   [place] E. Lilah Mijangos, PharmD, [id number]. [address] [place]  [id number]: [phone] Fax: [phone]/27/2017 8:00 AM     This encounter was created in error - please disregard.

## 2016-05-19 MED FILL — CHLORTHALIDONE 25 MG TABLET: 25 | 30 days supply | Qty: 30 | Fill #2

## 2016-05-19 MED FILL — ?ATORVASTATIN 40MG TABLET: 40 | 30 days supply | Qty: 30 | Fill #2

## 2016-05-29 ENCOUNTER — Other Ambulatory Visit: Payer: Self-pay | Admitting: Surgery

## 2016-05-29 DIAGNOSIS — N184 Chronic kidney disease, stage 4 (severe): Secondary | ICD-10-CM

## 2016-05-29 DIAGNOSIS — Z01810 Encounter for preprocedural cardiovascular examination: Secondary | ICD-10-CM

## 2016-06-19 ENCOUNTER — Other Ambulatory Visit: Payer: Self-pay | Admitting: Cardiovascular Disease

## 2016-06-19 DIAGNOSIS — I429 Cardiomyopathy, unspecified: Secondary | ICD-10-CM

## 2016-06-19 DIAGNOSIS — I10 Essential (primary) hypertension: Secondary | ICD-10-CM

## 2016-06-26 MED FILL — CHLORTHALIDONE 25 MG TABLET: 25 | 30 days supply | Qty: 30 | Fill #3

## 2016-06-26 MED FILL — ?ATORVASTATIN 40MG TABLET: 40 | 30 days supply | Qty: 30 | Fill #3

## 2016-07-02 ENCOUNTER — Encounter: Payer: Self-pay | Admitting: Surgery

## 2016-07-07 ENCOUNTER — Encounter: Payer: Self-pay | Admitting: Surgery

## 2016-07-07 ENCOUNTER — Ambulatory Visit (HOSPITAL_COMMUNITY)
Admission: RE | Admit: 2016-07-07 | Discharge: 2016-07-07 | Disposition: A | Discharge status: Home / Self Care | Payer: Medicaid Other | Source: Ambulatory Visit | Attending: Surgery | Admitting: Surgery

## 2016-07-07 ENCOUNTER — Ambulatory Visit (INDEPENDENT_AMBULATORY_CARE_PROVIDER_SITE_OTHER): Payer: Medicaid Other | Admitting: Surgery

## 2016-07-07 VITALS — BP 139/82 | HR 60 | Temp 97.2°F | Resp 20 | Ht 61.0 in | Wt 265.0 lb

## 2016-07-07 DIAGNOSIS — Z01810 Encounter for preprocedural cardiovascular examination: Secondary | ICD-10-CM

## 2016-07-07 DIAGNOSIS — I129 Hypertensive chronic kidney disease with stage 1 through stage 4 chronic kidney disease, or unspecified chronic kidney disease: Secondary | ICD-10-CM

## 2016-07-07 DIAGNOSIS — N184 Chronic kidney disease, stage 4 (severe): Secondary | ICD-10-CM

## 2016-07-07 NOTE — Progress Notes (Signed)
Vitals:   07/07/16 1229 07/07/16 1239  BP: (!) 163/88 139/82  Pulse: 60   Resp: 20   Temp: 97.2 F (36.2 C)   TempSrc: Oral   Weight: 265 lb (120.2 kg)   Height: _0  (1.549 m)                                        [facility] [place]  Patient name: Andrea Clements MRN: [id number] DOB: 02/18/1966 Sex: female  REFERRING PHYSICIAN: Dr. Lorrene Reid  REASON FOR CONSULT: Dialysis  HPI: Andrea Clements is a 50 y.o. female, who is Referred today for dialysis access.  She has stage IV renal insufficiency.  She is right-handed.  The patient has a history of stroke in 2015.  This presented with right-sided weakness and slurred speech.  She has recovered fully from this.  She takes a statin for hypercholesterolemia.  She is also managed for hypertension.  Past Medical History:  Diagnosis Date  . Asthma   . Cardiomyopathy ([place])    a. 09/2014 Echo: EF 30-35%, inflat, inf, infsept AK, Gr 2 DD-->No ischemic w/u 2/2 recent CVA and body habitus.  . CKD (chronic kidney disease), stage III   . CVA (cerebral infarction)    a. Acute sub cm LEFT basal ganglia/internal capsule lacunar infarct, normal MRA.  Marland Kitchen Hypertension   . Morbid obesity ([place])   . Stroke ([place])   . Tobacco abuse     Family History  Problem Relation Age of Onset  . Hypertension Mother   . Cancer Mother   . Hypertension Father   . Hypertension Maternal Grandfather   . Hypertension Paternal Grandmother   . Hypertension Paternal Grandfather   . Hypertension Maternal Grandmother     SOCIAL HISTORY: Social History   Social History  . Marital status: Single    Spouse name: N/A  . Number of children: N/A  . Years of education: N/A   Occupational History  . Not on file.   Social History Main Topics  . Smoking status: Current Every Day Smoker    Packs/day: 1.00    Years: 30.00    Types: Cigarettes    Last attempt to quit: 07/10/2015  . Smokeless tobacco: Not on file  . Alcohol use No   Comment: occ  . Drug use: No  . Sexual activity: Not on file   Other Topics Concern  . Not on file   Social History Narrative  . No narrative on file    Allergies  Allergen Reactions  . Altace [Ramipril] Anaphylaxis  . Aspirin Anaphylaxis    Current Outpatient Prescriptions  Medication Sig Dispense Refill  . albuterol (PROVENTIL HFA;VENTOLIN HFA) 108 (90 BASE) MCG/ACT inhaler Inhale 2 puffs into the lungs every 6 (six) hours as needed for wheezing or shortness of breath. 1 Inhaler 1  . amLODipine (NORVASC) 10 MG tablet TAKE ONE TABLET BY MOUTH ONCE DAILY 90 tablet 0  . atorvastatin (LIPITOR) 40 MG tablet Take 1 tablet (40 mg total) by mouth daily. 30 tablet 11  . chlorthalidone (HYGROTON) 25 MG tablet Take 1 tablet (25 mg total) by mouth daily. 30 tablet 11  . clopidogrel (PLAVIX) 75 MG tablet Take 1 tablet (75 mg total) by mouth daily with breakfast. 90 tablet 3  . clotrimazole (LOTRIMIN) 1 % cream Apply 1 application topically 2 (two) times daily. 30 g 0  .  hydrALAZINE (APRESOLINE) 50 MG tablet Take 1 tablet (50 mg total) by mouth 2 (two) times daily. 60 tablet 11  . isosorbide mononitrate (IMDUR) 30 MG 24 hr tablet Take 1 tablet (30 mg total) by mouth daily. 90 tablet 3  . metoprolol succinate (TOPROL-XL) 100 MG 24 hr tablet Take 1 tablet (100 mg total) by mouth daily. Take with or immediately following a meal. 90 tablet 3  . pantoprazole (PROTONIX) 40 MG tablet Take 1 tablet (40 mg total) by mouth daily. (Patient taking differently: Take 40 mg by mouth daily as needed (heartburn). ) 30 tablet 3  . potassium chloride SA (K-DUR,KLOR-CON) 20 MEQ tablet Take 1 tablet (20 mEq total) by mouth daily. 90 tablet 3  . spironolactone (ALDACTONE) 25 MG tablet Take 1 tablet (25 mg total) by mouth daily. 90 tablet 3  . Blood Glucose Monitoring Suppl (TRUE METRIX GO GLUCOSE METER) w/Device KIT 1 each by Does not apply route every 8 (eight) hours as needed. (Patient not taking: Reported on  07/07/2016) 1 kit 0  . glucose blood test strip Use as instructed (Patient not taking: Reported on 07/07/2016) 100 each 12  . TRUEPLUS LANCETS 26G MISC 1 each by Does not apply route every 8 (eight) hours as needed. (Patient not taking: Reported on 07/07/2016) 100 each 12   No current facility-administered medications for this visit.     REVIEW OF SYSTEMS:  _0  denotes positive finding, _1  denotes negative finding Cardiac  Comments:  Chest pain or chest pressure:    Shortness of breath upon exertion:    Short of breath when lying flat:    Irregular heart rhythm:        Vascular    Pain in calf, thigh, or hip brought on by ambulation: x   Pain in feet at night that wakes you up from your sleep:     Blood clot in your veins:    Leg swelling:         Pulmonary    Oxygen at home:    Productive cough:     Wheezing:         Neurologic    Sudden weakness in arms or legs:     Sudden numbness in arms or legs:     Sudden onset of difficulty speaking or slurred speech:    Temporary loss of vision in one eye:     Problems with dizziness:  x       Gastrointestinal    Blood in stool:     Vomited blood:         Genitourinary    Burning when urinating:     Blood in urine:        Psychiatric    Major depression:         Hematologic    Bleeding problems:    Problems with blood clotting too easily:        Skin    Rashes or ulcers:        Constitutional    Fever or chills:      PHYSICAL EXAM: Vitals:   07/07/16 1229 07/07/16 1239  BP: (!) 163/88 139/82  Pulse: 60   Resp: 20   Temp: 97.2 F (36.2 C)   TempSrc: Oral   Weight: 265 lb (120.2 kg)   Height: _2  (1.549 m)     GENERAL: The patient is a well-nourished female, in no acute distress. The vital signs are documented above. CARDIAC: There is a regular rate  and rhythm.  VASCULAR: Palpable left radial and brachial pulse PULMONARY: There is good air exchange bilaterally without wheezing or rales. MUSCULOSKELETAL:  There are no major deformities or cyanosis. NEUROLOGIC: No focal weakness or paresthesias are detected. SKIN: There are no ulcers or rashes noted. PSYCHIATRIC: The patient has a normal affect.  DATA:  I have reviewed the patient's vein mapping.  She has a marginal left cephalic vein.  The basilic vein on the left was not visualized. Arterial studies were normal  ASSESSMENT AND PLAN: I discussed proceeding with a left brachiocephalic fistula.  She understands she may need additional procedures in order to get this to mature.  She also understands that this may need to be elevated if it is found to be too deep under the skin.  All of her questions were answered.  Her operations been scheduled for Wednesday September*13th.  I will stop her Plavix 5 days at a time.   Annamarie Major, MD [facility] [phone] Pager [phone]

## 2016-07-08 ENCOUNTER — Other Ambulatory Visit: Payer: Self-pay | Admitting: *Deleted

## 2016-07-18 NOTE — Pre-Procedure Instructions (Signed)
Andrea Clements  07/18/2016      [facility] [id number] - [place] - [address] [facility] [address] [facility] [address] [place] [address] Phone: [phone] Fax: [phone]  [facility] [id number] - [place] - [id number] AT [facility] [address] [place] [address] Phone: [phone] Fax: [phone]  [facility] [place] - [place] [place] [address] Phone: [phone] Fax: [phone]  [facility] [address], [place] - [id number]  [place] [address] Phone: [phone] Fax: [phone]    Your procedure is scheduled on Wednesday, July 23, 2016   [facility] Admitting at 8:45 A.M.   Call this number if you have problems the morning of surgery:  [phone]   Remember:   Do not eat food or drink liquids after midnight Tuesday, July 22, 2016   Take these medicines the morning of surgery with A SIP OF WATER : Amlodipine ( Norvasc), Hydralazine ( Apresoline), Isosorbide Mononitrate ( Imdur) Metoprolol ( Toprol-XL), if needed: Pantoprazole ( Protonix) for heartburn,  Albuterol inhaler ( bring inhaler in with you on day of surgery)   Stop taking vitamins, fish oil, Plavix,  and herbal medications. Do not take any NSAIDs ie: Ibuprofen, Advil, Naproxen, BC and Goody Powder; stop now .  Do not wear jewelry, make-up or nail polish.  Do not wear lotions, powders, or perfumes, or deoderant.  Do not shave 48 hours prior to surgery.    Do not bring valuables to the hospital.  [facility] is not responsible for any belongings or valuables.   Contacts, dentures or bridgework may not be worn into surgery.  Leave your suitcase in the car.  After surgery it may be brought to your room.  For patients admitted to the hospital, discharge time will be  determined by your treatment team.  Patients discharged the day of surgery will not be allowed to drive home.     Special instructions:   [place] - Preparing for Surgery  Before surgery, you can play an important role.  Because skin is not sterile, your skin needs to be as free of germs as possible.  You can reduce the number of germs on you skin by washing with CHG (chlorahexidine gluconate) soap before surgery.  CHG is an antiseptic cleaner which kills germs and bonds with the skin to continue killing germs even after washing.  Please DO NOT use if you have an allergy to CHG or antibacterial soaps.  If your skin becomes reddened/irritated stop using the CHG and inform your nurse when you arrive at Short Stay.  Do not shave (including legs and underarms) for at least 48 hours prior to the first CHG shower.  You may shave your face.  Please follow these instructions carefully:   1.  Shower with CHG Soap the night before surgery and the morning of Surgery.  2.  If you choose to wash your hair, wash your hair first as usual with your normal shampoo.  3.  After you shampoo, rinse your hair and body thoroughly to remove the Shampoo.  4.  Use CHG as you would any other liquid soap.  You can apply chg directly  to the skin and wash gently with scrungie or a clean washcloth.  5.  Apply the CHG Soap  to your body ONLY FROM THE NECK DOWN.  Do not use on open wounds or open sores.  Avoid contact with your eyes, ears, mouth and genitals (private parts).  Wash genitals (private parts) with your normal soap.  6.  Wash thoroughly, paying special attention to the area where your surgery will be performed.  7.  Thoroughly rinse your body with warm water from the neck down.  8.  DO NOT shower/wash with your normal soap after using and rinsing off the CHG Soap.  9.  Pat yourself dry with a clean towel.            10.  Wear clean pajamas.            11.  Place clean sheets on your bed the night of your first  shower and do not sleep with pets.  Day of Surgery  Do not apply any lotions/deodorants the morning of surgery.  Please wear clean clothes to the hospital/surgery center.  Please read over the following fact sheets that you were given. Pain Booklet, Coughing and Deep Breathing and Surgical Site Infection Prevention

## 2016-07-21 ENCOUNTER — Encounter (HOSPITAL_COMMUNITY)
Admission: RE | Admit: 2016-07-21 | Discharge: 2016-07-21 | Disposition: A | Discharge status: Home / Self Care | Payer: Medicaid Other | Source: Ambulatory Visit | Attending: Surgery | Admitting: Surgery

## 2016-07-21 ENCOUNTER — Encounter (HOSPITAL_COMMUNITY): Payer: Self-pay

## 2016-07-21 HISTORY — DX: Anxiety disorder, unspecified: F41.9

## 2016-07-21 HISTORY — DX: Depression, unspecified: F32.A

## 2016-07-21 HISTORY — DX: Major depressive disorder, single episode, unspecified: F32.9

## 2016-07-21 HISTORY — DX: Gastro-esophageal reflux disease without esophagitis: K21.9

## 2016-07-21 HISTORY — DX: Unspecified osteoarthritis, unspecified site: M19.90

## 2016-07-21 HISTORY — DX: Pneumonia, unspecified organism: J18.9

## 2016-07-22 ENCOUNTER — Ambulatory Visit: Payer: Medicaid Other | Admitting: Internal Medicine

## 2016-07-23 ENCOUNTER — Ambulatory Visit (HOSPITAL_COMMUNITY): Payer: Medicaid Other | Admitting: Certified Registered Nurse Anesthetist

## 2016-07-23 ENCOUNTER — Ambulatory Visit (HOSPITAL_COMMUNITY)
Admission: RE | Admit: 2016-07-23 | Discharge: 2016-07-23 | Disposition: A | Discharge status: Home / Self Care | Payer: Medicaid Other | Source: Ambulatory Visit | Attending: Surgery | Admitting: Surgery

## 2016-07-23 ENCOUNTER — Telehealth: Payer: Self-pay | Admitting: Surgery

## 2016-07-23 ENCOUNTER — Encounter (HOSPITAL_COMMUNITY): Payer: Self-pay | Admitting: *Deleted

## 2016-07-23 ENCOUNTER — Encounter (HOSPITAL_COMMUNITY)
Admission: RE | Disposition: A | Discharge status: Home / Self Care | Payer: Self-pay | Source: Ambulatory Visit | Attending: Surgery

## 2016-07-23 ENCOUNTER — Other Ambulatory Visit: Payer: Self-pay | Admitting: *Deleted

## 2016-07-23 DIAGNOSIS — J45909 Unspecified asthma, uncomplicated: Secondary | ICD-10-CM | POA: Insufficient documentation

## 2016-07-23 DIAGNOSIS — Z7902 Long term (current) use of antithrombotics/antiplatelets: Secondary | ICD-10-CM | POA: Insufficient documentation

## 2016-07-23 DIAGNOSIS — Z8673 Personal history of transient ischemic attack (TIA), and cerebral infarction without residual deficits: Secondary | ICD-10-CM | POA: Diagnosis not present

## 2016-07-23 DIAGNOSIS — F17210 Nicotine dependence, cigarettes, uncomplicated: Secondary | ICD-10-CM | POA: Insufficient documentation

## 2016-07-23 DIAGNOSIS — N185 Chronic kidney disease, stage 5: Secondary | ICD-10-CM | POA: Diagnosis not present

## 2016-07-23 DIAGNOSIS — F419 Anxiety disorder, unspecified: Secondary | ICD-10-CM | POA: Diagnosis not present

## 2016-07-23 DIAGNOSIS — I429 Cardiomyopathy, unspecified: Secondary | ICD-10-CM | POA: Insufficient documentation

## 2016-07-23 DIAGNOSIS — I120 Hypertensive chronic kidney disease with stage 5 chronic kidney disease or end stage renal disease: Secondary | ICD-10-CM | POA: Diagnosis present

## 2016-07-23 DIAGNOSIS — F329 Major depressive disorder, single episode, unspecified: Secondary | ICD-10-CM | POA: Diagnosis not present

## 2016-07-23 DIAGNOSIS — N186 End stage renal disease: Secondary | ICD-10-CM | POA: Diagnosis not present

## 2016-07-23 DIAGNOSIS — Z79899 Other long term (current) drug therapy: Secondary | ICD-10-CM | POA: Insufficient documentation

## 2016-07-23 DIAGNOSIS — M1990 Unspecified osteoarthritis, unspecified site: Secondary | ICD-10-CM | POA: Insufficient documentation

## 2016-07-23 DIAGNOSIS — Z4931 Encounter for adequacy testing for hemodialysis: Secondary | ICD-10-CM

## 2016-07-23 HISTORY — PX: AV FISTULA PLACEMENT: SHX1204

## 2016-07-23 LAB — POCT I-STAT 4, (NA,K, GLUC, HGB,HCT)
GLUCOSE: 100 mg/dL — AB (ref 65–99)
HEMATOCRIT: 38 % (ref 36.0–46.0)
HEMOGLOBIN: 12.9 g/dL (ref 12.0–15.0)
POTASSIUM: 4.1 mmol/L (ref 3.5–5.1)
SODIUM: 145 mmol/L (ref 135–145)

## 2016-07-23 SURGERY — ARTERIOVENOUS (AV) FISTULA CREATION
Anesthesia: General | Site: Arm Upper | Laterality: Left

## 2016-07-23 MED ORDER — PROMETHAZINE HCL 25 MG/ML IJ SOLN: 6.2500 mg | INTRAMUSCULAR | Status: DC | PRN

## 2016-07-23 MED ORDER — SODIUM CHLORIDE 0.9 % IV SOLN
INTRAVENOUS | Status: DC | PRN
  Administered 2016-07-23: 500 mL

## 2016-07-23 MED ORDER — OXYCODONE-ACETAMINOPHEN 5-325 MG PO TABS
1.0000 | ORAL_TABLET | Freq: Once | ORAL | Status: AC
  Administered 2016-07-23: 1 via ORAL

## 2016-07-23 MED ORDER — HEPARIN SODIUM (PORCINE) 1000 UNIT/ML IJ SOLN
INTRAMUSCULAR | Status: DC | PRN
  Administered 2016-07-23: 3000 [IU] via INTRAVENOUS

## 2016-07-23 MED ORDER — SODIUM CHLORIDE 0.9 % IV SOLN
INTRAVENOUS | Status: DC
  Administered 2016-07-23: 12:00:00 via INTRAVENOUS
  Administered 2016-07-23: 20 mL/h via INTRAVENOUS

## 2016-07-23 MED ORDER — PHENYLEPHRINE HCL 10 MG/ML IJ SOLN
INTRAMUSCULAR | Status: DC | PRN
  Administered 2016-07-23 (×3): 80 ug via INTRAVENOUS

## 2016-07-23 MED ORDER — PROTAMINE SULFATE 10 MG/ML IV SOLN
INTRAVENOUS | Status: DC | PRN
  Administered 2016-07-23: 25 mg via INTRAVENOUS

## 2016-07-23 MED ORDER — OXYCODONE-ACETAMINOPHEN 5-325 MG PO TABS: 1.0000 | ORAL_TABLET | Freq: Four times a day (QID) | ORAL | 0 refills | Status: DC | PRN

## 2016-07-23 MED ORDER — SODIUM CHLORIDE 0.9 % IV SOLN: INTRAVENOUS | Status: DC

## 2016-07-23 MED ORDER — 0.9 % SODIUM CHLORIDE (POUR BTL) OPTIME
TOPICAL | Status: DC | PRN
  Administered 2016-07-23: 1000 mL

## 2016-07-23 MED ORDER — LACTATED RINGERS IV SOLN: INTRAVENOUS | Status: DC

## 2016-07-23 MED ORDER — CHLORHEXIDINE GLUCONATE CLOTH 2 % EX PADS: 6.0000 | MEDICATED_PAD | Freq: Once | CUTANEOUS | Status: DC

## 2016-07-23 MED ORDER — LIDOCAINE-EPINEPHRINE (PF) 1 %-1:200000 IJ SOLN
INTRAMUSCULAR | Status: AC
  Filled 2016-07-23: qty 30

## 2016-07-23 MED ORDER — EPHEDRINE SULFATE 50 MG/ML IJ SOLN
INTRAMUSCULAR | Status: DC | PRN
  Administered 2016-07-23 (×2): 5 mg via INTRAVENOUS

## 2016-07-23 MED ORDER — PROPOFOL 10 MG/ML IV BOLUS
INTRAVENOUS | Status: AC
  Filled 2016-07-23: qty 20

## 2016-07-23 MED ORDER — FENTANYL CITRATE (PF) 100 MCG/2ML IJ SOLN: 25.0000 ug | Freq: Once | INTRAMUSCULAR | Status: DC

## 2016-07-23 MED ORDER — FENTANYL CITRATE (PF) 100 MCG/2ML IJ SOLN
INTRAMUSCULAR | Status: DC
Start: 2016-07-23 — End: 2016-07-23
  Filled 2016-07-23: qty 2

## 2016-07-23 MED ORDER — ONDANSETRON HCL 4 MG/2ML IJ SOLN
INTRAMUSCULAR | Status: DC | PRN
  Administered 2016-07-23: 4 mg via INTRAVENOUS

## 2016-07-23 MED ORDER — LIDOCAINE HCL (CARDIAC) 20 MG/ML IV SOLN
INTRAVENOUS | Status: DC | PRN
  Administered 2016-07-23: 50 mg via INTRAVENOUS

## 2016-07-23 MED ORDER — FENTANYL CITRATE (PF) 100 MCG/2ML IJ SOLN
INTRAMUSCULAR | Status: DC | PRN
  Administered 2016-07-23: 25 ug via INTRAVENOUS
  Administered 2016-07-23 (×2): 50 ug via INTRAVENOUS
  Administered 2016-07-23: 25 ug via INTRAVENOUS

## 2016-07-23 MED ORDER — MIDAZOLAM HCL 2 MG/2ML IJ SOLN
INTRAMUSCULAR | Status: AC
  Filled 2016-07-23: qty 2

## 2016-07-23 MED ORDER — MIDAZOLAM HCL 5 MG/5ML IJ SOLN
INTRAMUSCULAR | Status: DC | PRN
  Administered 2016-07-23: 2 mg via INTRAVENOUS

## 2016-07-23 MED ORDER — FENTANYL CITRATE (PF) 250 MCG/5ML IJ SOLN
INTRAMUSCULAR | Status: AC
  Filled 2016-07-23: qty 5

## 2016-07-23 MED ORDER — MEPERIDINE HCL 25 MG/ML IJ SOLN: 6.2500 mg | INTRAMUSCULAR | Status: DC | PRN

## 2016-07-23 MED ORDER — FENTANYL CITRATE (PF) 100 MCG/2ML IJ SOLN
25.0000 ug | INTRAMUSCULAR | Status: DC | PRN
  Administered 2016-07-23: 50 ug via INTRAVENOUS

## 2016-07-23 MED ORDER — PHENYLEPHRINE HCL 10 MG/ML IJ SOLN
INTRAVENOUS | Status: DC | PRN
  Administered 2016-07-23: 20 ug/min via INTRAVENOUS

## 2016-07-23 MED ORDER — PROPOFOL 10 MG/ML IV BOLUS
INTRAVENOUS | Status: DC | PRN
Start: 2016-07-23 — End: 2016-07-23
  Administered 2016-07-23: 200 mg via INTRAVENOUS

## 2016-07-23 MED ORDER — DEXTROSE 5 % IV SOLN
1.5000 g | INTRAVENOUS | Status: AC
  Administered 2016-07-23: 1.5 g via INTRAVENOUS
  Filled 2016-07-23: qty 1.5

## 2016-07-23 MED ORDER — OXYCODONE-ACETAMINOPHEN 5-325 MG PO TABS
ORAL_TABLET | ORAL | Status: AC
  Filled 2016-07-23: qty 1

## 2016-07-23 SURGICAL SUPPLY — 34 items
ARMBAND PINK RESTRICT EXTREMIT (MISCELLANEOUS) ×6 IMPLANT
CANISTER SUCTION 2500CC (MISCELLANEOUS) ×3 IMPLANT
CLIP TI MEDIUM 6 (CLIP) ×3 IMPLANT
CLIP TI WIDE RED SMALL 6 (CLIP) ×3 IMPLANT
COVER PROBE W GEL 5X96 (DRAPES) ×3 IMPLANT
DRAPE UTILITY 15X26 W/TAPE STR (DRAPE) ×3 IMPLANT
ELECT REM PT RETURN 9FT ADLT (ELECTROSURGICAL) ×3
ELECTRODE REM PT RTRN 9FT ADLT (ELECTROSURGICAL) ×1 IMPLANT
GLOVE BIO SURGEON STRL SZ 6.5 (GLOVE) ×4 IMPLANT
GLOVE BIO SURGEON STRL SZ7 (GLOVE) ×6 IMPLANT
GLOVE BIO SURGEONS STRL SZ 6.5 (GLOVE) ×2
GLOVE BIOGEL PI IND STRL 7.0 (GLOVE) ×3 IMPLANT
GLOVE BIOGEL PI IND STRL 7.5 (GLOVE) ×1 IMPLANT
GLOVE BIOGEL PI INDICATOR 7.0 (GLOVE) ×6
GLOVE BIOGEL PI INDICATOR 7.5 (GLOVE) ×2
GLOVE ECLIPSE 6.5 STRL STRAW (GLOVE) ×3 IMPLANT
GLOVE SURG SS PI 7.5 STRL IVOR (GLOVE) ×3 IMPLANT
GOWN STRL REUS W/ TWL LRG LVL3 (GOWN DISPOSABLE) ×2 IMPLANT
GOWN STRL REUS W/ TWL XL LVL3 (GOWN DISPOSABLE) ×1 IMPLANT
GOWN STRL REUS W/TWL LRG LVL3 (GOWN DISPOSABLE) ×4
GOWN STRL REUS W/TWL XL LVL3 (GOWN DISPOSABLE) ×2
HEMOSTAT SNOW SURGICEL 2X4 (HEMOSTASIS) IMPLANT
KIT BASIN OR (CUSTOM PROCEDURE TRAY) ×3 IMPLANT
KIT ROOM TURNOVER OR (KITS) ×3 IMPLANT
LIQUID BAND (GAUZE/BANDAGES/DRESSINGS) ×3 IMPLANT
NS IRRIG 1000ML POUR BTL (IV SOLUTION) ×3 IMPLANT
PACK CV ACCESS (CUSTOM PROCEDURE TRAY) ×3 IMPLANT
PAD ARMBOARD 7.5X6 YLW CONV (MISCELLANEOUS) ×6 IMPLANT
SUT PROLENE 6 0 CC (SUTURE) ×3 IMPLANT
SUT VIC AB 3-0 SH 27 (SUTURE) ×2
SUT VIC AB 3-0 SH 27X BRD (SUTURE) ×1 IMPLANT
SUT VICRYL 4-0 PS2 18IN ABS (SUTURE) ×3 IMPLANT
UNDERPAD 30X30 (UNDERPADS AND DIAPERS) ×3 IMPLANT
WATER STERILE IRR 1000ML POUR (IV SOLUTION) IMPLANT

## 2016-07-23 NOTE — Telephone Encounter (Signed)
-----   Message from Sharee Pimple, RN sent at 07/23/2016  2:09 PM EDT ----- Regarding: schedule   ----- Message ----- From: Dara Lords, PA-C Sent: 07/23/2016  12:55 PM To: Vvs Charge Pool  S/p left RC AVF 07/23/16.  F/u with Dr. Myra Gianotti in 4-6 weeks with duplex.  Thanks

## 2016-07-23 NOTE — Op Note (Signed)
    Patient name: Andrea Clements MRN: [id number] DOB: 10-19-1966 Sex: female  07/23/2016 Pre-operative Diagnosis: ESRD  Post-operative diagnosis:  Same Surgeon:  Durene Cal Assistants:  S. Rhyne Procedure:   Left radio-cephalic fistula Anesthesia:  Gneral Blood Loss:  See anesthesia record Specimens:  none  Findings:  59mm cephalic vein, disease free 62mm artery  Indications:  The patient is not yet on dialysis.  She is here today for fistula creation.  Risks and benefits were discussed with the patient at her preoperative visit.  Procedure:  The patient was identified in the holding area and taken to [facility] OR ROOM 05  The patient was then placed supine on the table. general anesthesia was administered.  The patient was prepped and draped in the usual sterile fashion.  A time out was called and antibiotics were administered.  Ultrasound was used to evaluate the cephalic vein in the arm.  The cephalic vein appeared to be of excellent caliber from the wrist up to the shoulder.  Therefore, I elected to proceed with a radiocephalic fistula rather than a brachiocephalic fistula.  A longitudinal incision was made just proximal to the wrist.  I dissected out the cephalic vein.  This was a 3-4 millimeter vein.  It was fully mobilized.  Side branches were ligated with silk ties.  It was marked with a 8 pen for orientation.  Next, I dissected out the radial artery.  This was a disease-free 3 mm artery.  Once adequate exposure was obtained, the patient was given 3000 years a heparin.  The vein was then ligated distally.  The vein dilated nicely with heparinized saline.  I then occluded the artery with Serafin clamps.  A #11 blade was used to make an arteriotomy which was extended longitudinally with Potts scissors.  The vein was cut to the appropriate length and spatulated to fit the size of the arteriotomy.  A running anastomosis was created with 6-0 Prolene.  Prior to completion the appropriate flushing  maneuvers were performed and the anastomosis was completed.  Blood flow was then reestablished.  There was excellent thrill within the fistula.  I inspected the tract of the fistula and mobilized it to make sure there were no kinks or visible branches.  There was a good Doppler signal within the fistula up to the elbow.  There were also biphasic Doppler signals within the radial and ulnar artery.  25 mg of protamine was given.  Once hemostasis was satisfactory, subcutaneous tissue was closed with 3-0 Vicryl.  The skin was closed with 4-0 Vicryl followed by Dermabond.  There were no immediate complications   Disposition:  To PACU in stable condition.   Juleen China, M.D. [facility] [place] Office: [phone] Pager:  [phone]

## 2016-07-23 NOTE — Anesthesia Preprocedure Evaluation (Addendum)
Anesthesia Evaluation  Patient identified by MRN, date of birth, ID band Patient awake    Reviewed: Allergy & Precautions, NPO status , Patient's Chart, lab work & pertinent test results, reviewed documented beta blocker date and time   Airway Mallampati: III  TM Distance: >3 FB Neck ROM: Full    Dental  (+) Teeth Intact, Dental Advisory Given   Pulmonary asthma , Current Smoker,    breath sounds clear to auscultation       Cardiovascular hypertension, Pt. on medications and Pt. on home beta blockers  Rhythm:Regular Rate:Normal     Neuro/Psych PSYCHIATRIC DISORDERS Anxiety Depression CVA    GI/Hepatic Neg liver ROS, GERD  Medicated,  Endo/Other  negative endocrine ROS  Renal/GU Renal disease  negative genitourinary   Musculoskeletal  (+) Arthritis ,   Abdominal   Peds negative pediatric ROS (+)  Hematology negative hematology ROS (+)   Anesthesia Other Findings   Reproductive/Obstetrics negative OB ROS                            10/2015 EKG: normal sinus rhythm, 1st degree AV block.  10/2015 Echo - Left ventricle: The cavity size was normal. Wall thickness was   increased in a pattern of moderate LVH of the posterior wall.   Systolic function was normal. The estimated ejection fraction was   in the range of 50% to 55%. Mild diffuse hypokinesis. Features   are consistent with a pseudonormal left ventricular filling   pattern, with concomitant abnormal relaxation and increased   filling pressure (grade 2 diastolic dysfunction). Doppler   parameters are consistent with high ventricular filling pressure. - Aortic valve: There was mild regurgitation. - Mitral valve: There was trivial regurgitation. - Left atrium: The atrium was mildly dilated. - Right ventricle: The cavity size was normal. Wall thickness was   normal. Systolic function was normal. - Atrial septum: The septum bowed from  left to right, consistent   with increased left atrial pressure. - Inferior vena cava: The vessel was normal in size. The   respirophasic diameter changes were in the normal range (= 50%),   consistent with normal central venous pressure.  Anesthesia Physical Anesthesia Plan  ASA: III  Anesthesia Plan: General   Post-op Pain Management:    Induction: Intravenous  Airway Management Planned: LMA  Additional Equipment:   Intra-op Plan:   Post-operative Plan:   Informed Consent: I have reviewed the patients History and Physical, chart, labs and discussed the procedure including the risks, benefits and alternatives for the proposed anesthesia with the patient or authorized representative who has indicated his/her understanding and acceptance.   Dental advisory given  Plan Discussed with: CRNA  Anesthesia Plan Comments:         Anesthesia Quick Evaluation

## 2016-07-23 NOTE — Transfer of Care (Signed)
Immediate Anesthesia Transfer of Care Note  Patient: Andrea Clements  Procedure(s) Performed: Procedure(s): ARTERIOVENOUS (AV) FISTULA CREATION LEFT BRACHIOCEPHALIC (Left)  Patient Location: PACU  Anesthesia Type:General  Level of Consciousness: awake, alert , oriented and patient cooperative  Airway & Oxygen Therapy: Patient Spontanous Breathing and Patient connected to face mask oxygen  Post-op Assessment: Report given to RN and Post -op Vital signs reviewed and stable  Post vital signs: Reviewed and stable  Last Vitals:  Vitals:   07/23/16 0910 07/23/16 1305  BP: (!) 141/74 113/84  Pulse: 65 65  Resp: 20 12  Temp: 36.7 C 36.3 C    Last Pain:  Vitals:   07/23/16 0910  TempSrc: Oral      Patients Stated Pain Goal: 3 (07/23/16 0916)  Complications: No apparent anesthesia complications

## 2016-07-23 NOTE — Telephone Encounter (Signed)
Sched appt 10/23; lab at 2:00 and MD at 2:45. Lm on hm# to inform pt of appt.

## 2016-07-23 NOTE — Anesthesia Postprocedure Evaluation (Signed)
Anesthesia Post Note  Patient: LYZBETH MOISA  Procedure(s) Performed: Procedure(s) (LRB): ARTERIOVENOUS (AV) FISTULA CREATION LEFT BRACHIOCEPHALIC (Left)  Patient location during evaluation: PACU Anesthesia Type: General Level of consciousness: awake and alert Pain management: pain level controlled Vital Signs Assessment: post-procedure vital signs reviewed and stable Respiratory status: spontaneous breathing, nonlabored ventilation, respiratory function stable and patient connected to nasal cannula oxygen Cardiovascular status: blood pressure returned to baseline and stable Postop Assessment: no signs of nausea or vomiting Anesthetic complications: no    Last Vitals:  Vitals:   07/23/16 1330 07/23/16 1338  BP: 101/88 120/76  Pulse:  62  Resp:  12  Temp: 36.5 C     Last Pain:  Vitals:   07/23/16 1338  TempSrc:   PainSc: 2                  Shelton Silvas

## 2016-07-23 NOTE — Anesthesia Procedure Notes (Signed)
Procedure Name: LMA Insertion Date/Time: 07/23/2016 11:38 AM Performed by: Adonis Housekeeper Pre-anesthesia Checklist: Patient identified, Emergency Drugs available, Suction available and Patient being monitored Patient Re-evaluated:Patient Re-evaluated prior to inductionOxygen Delivery Method: Circle system utilized Preoxygenation: Pre-oxygenation with 100% oxygen Intubation Type: IV induction Ventilation: Mask ventilation without difficulty LMA: LMA inserted LMA Size: 4.0 Number of attempts: 1 Placement Confirmation: positive ETCO2 and breath sounds checked- equal and bilateral Tube secured with: Tape Dental Injury: Teeth and Oropharynx as per pre-operative assessment

## 2016-07-23 NOTE — Discharge Instructions (Signed)
° ° °  07/23/2016 Andrea Clements [id number] [birth year]  Surgeon(s): Nada Libman, MD  Procedure(s): Creation of left Radial Cephalic AV Fistula  x Do not stick fistula for 12 weeks

## 2016-07-23 NOTE — H&P (Signed)
[facility]ist of [facility]  Patient name: Andrea Clements        MRN: [id number]        DOB: 1966-04-09        Sex: female  REFERRING PHYSICIAN: Dr. Lorrene Reid  REASON FOR CONSULT: Dialysis  HPI: Andrea Clements is a 50 y.o. female, who is Referred today for dialysis access.  She has stage IV renal insufficiency.  She is right-handed.  The patient has a history of stroke in 2015.  This presented with right-sided weakness and slurred speech.  She has recovered fully from this.  She takes a statin for hypercholesterolemia.  She is also managed for hypertension.      Past Medical History:  Diagnosis Date  . Asthma   . Cardiomyopathy ([place])    a. 09/2014 Echo: EF 30-35%, inflat, inf, infsept AK, Gr 2 DD-->No ischemic w/u 2/2 recent CVA and body habitus.  . CKD (chronic kidney disease), stage III   . CVA (cerebral infarction)    a. Acute sub cm LEFT basal ganglia/internal capsule lacunar infarct, normal MRA.  Marland Kitchen Hypertension   . Morbid obesity ([place])   . Stroke ([place])   . Tobacco abuse          Family History  Problem Relation Age of Onset  . Hypertension Mother   . Cancer Mother   . Hypertension Father   . Hypertension Maternal Grandfather   . Hypertension Paternal Grandmother   . Hypertension Paternal Grandfather   . Hypertension Maternal Grandmother     SOCIAL HISTORY: Social History        Social History  . Marital status: Single    Spouse name: N/A  . Number of children: N/A  . Years of education: N/A      Occupational History  . Not on file.         Social History Main Topics  . Smoking status: Current Every Day Smoker    Packs/day: 1.00    Years: 30.00    Types: Cigarettes    Last attempt to quit: 07/10/2015  . Smokeless tobacco: Not on file  . Alcohol use No     Comment: occ  . Drug use: No  . Sexual activity: Not on file       Other Topics Concern  . Not  on file      Social History Narrative  . No narrative on file        Allergies  Allergen Reactions  . Altace [Ramipril] Anaphylaxis  . Aspirin Anaphylaxis          Current Outpatient Prescriptions  Medication Sig Dispense Refill  . albuterol (PROVENTIL HFA;VENTOLIN HFA) 108 (90 BASE) MCG/ACT inhaler Inhale 2 puffs into the lungs every 6 (six) hours as needed for wheezing or shortness of breath. 1 Inhaler 1  . amLODipine (NORVASC) 10 MG tablet TAKE ONE TABLET BY MOUTH ONCE DAILY 90 tablet 0  . atorvastatin (LIPITOR) 40 MG tablet Take 1 tablet (40 mg total) by mouth daily. 30 tablet 11  . chlorthalidone (HYGROTON) 25 MG tablet Take 1 tablet (25 mg total) by mouth daily. 30 tablet 11  . clopidogrel (PLAVIX) 75 MG tablet Take 1 tablet (75 mg total) by mouth daily with breakfast. 90 tablet 3  . clotrimazole (LOTRIMIN) 1 % cream Apply 1 application topically 2 (two) times daily. 30 g 0  . hydrALAZINE (APRESOLINE) 50 MG tablet Take 1 tablet (50 mg total) by mouth 2 (two) times daily. [place]  tablet 11  . isosorbide mononitrate (IMDUR) 30 MG 24 hr tablet Take 1 tablet (30 mg total) by mouth daily. 90 tablet 3  . metoprolol succinate (TOPROL-XL) 100 MG 24 hr tablet Take 1 tablet (100 mg total) by mouth daily. Take with or immediately following a meal. 90 tablet 3  . pantoprazole (PROTONIX) 40 MG tablet Take 1 tablet (40 mg total) by mouth daily. (Patient taking differently: Take 40 mg by mouth daily as needed (heartburn). ) 30 tablet 3  . potassium chloride SA (K-DUR,KLOR-CON) 20 MEQ tablet Take 1 tablet (20 mEq total) by mouth daily. 90 tablet 3  . spironolactone (ALDACTONE) 25 MG tablet Take 1 tablet (25 mg total) by mouth daily. 90 tablet 3  . Blood Glucose Monitoring Suppl (TRUE METRIX GO GLUCOSE METER) w/Device KIT 1 each by Does not apply route every 8 (eight) hours as needed. (Patient not taking: Reported on 07/07/2016) 1 kit 0  . glucose blood test strip Use as instructed (Patient not  taking: Reported on 07/07/2016) 100 each 12  . TRUEPLUS LANCETS 26G MISC 1 each by Does not apply route every 8 (eight) hours as needed. (Patient not taking: Reported on 07/07/2016) 100 each 12   No current facility-administered medications for this visit.     REVIEW OF SYSTEMS:  _0  denotes positive finding, _1  denotes negative finding Cardiac  Comments:  Chest pain or chest pressure:    Shortness of breath upon exertion:    Short of breath when lying flat:    Irregular heart rhythm:        Vascular    Pain in calf, thigh, or hip brought on by ambulation: x   Pain in feet at night that wakes you up from your sleep:     Blood clot in your veins:    Leg swelling:         Pulmonary    Oxygen at home:    Productive cough:     Wheezing:         Neurologic    Sudden weakness in arms or legs:     Sudden numbness in arms or legs:     Sudden onset of difficulty speaking or slurred speech:    Temporary loss of vision in one eye:     Problems with dizziness:  x       Gastrointestinal    Blood in stool:     Vomited blood:         Genitourinary    Burning when urinating:     Blood in urine:        Psychiatric    Major depression:         Hematologic    Bleeding problems:    Problems with blood clotting too easily:        Skin    Rashes or ulcers:        Constitutional    Fever or chills:      PHYSICAL EXAM:     Vitals:   07/07/16 1229 07/07/16 1239  BP: (!) 163/88 139/82  Pulse: 60   Resp: 20   Temp: 97.2 F (36.2 C)   TempSrc: Oral   Weight: 265 lb (120.2 kg)   Height: _2  (1.549 m)     GENERAL: The patient is a well-nourished female, in no acute distress. The vital signs are documented above. CARDIAC: There is a regular rate and rhythm.  VASCULAR: Palpable left radial and brachial pulse PULMONARY: There is good  air exchange bilaterally without  wheezing or rales. MUSCULOSKELETAL: There are no major deformities or cyanosis. NEUROLOGIC: No focal weakness or paresthesias are detected. SKIN: There are no ulcers or rashes noted. PSYCHIATRIC: The patient has a normal affect.  DATA:  I have reviewed the patient's vein mapping.  She has a marginal left cephalic vein.  The basilic vein on the left was not visualized. Arterial studies were normal  ASSESSMENT AND PLAN: I discussed proceeding with a left brachiocephalic fistula.  She understands she may need additional procedures in order to get this to mature.  She also understands that this may need to be elevated if it is found to be too deep under the skin.  All of her questions were answered.  Her operations been scheduled for Wednesday September*13th.  I will stop her Plavix 5 days at a time.   Annamarie Major, MD [facility] Specialists of [facility] [phone] Pager [phone]

## 2016-07-24 ENCOUNTER — Encounter (HOSPITAL_COMMUNITY): Payer: Self-pay | Admitting: Surgery

## 2016-07-29 ENCOUNTER — Other Ambulatory Visit: Payer: Self-pay | Admitting: Cardiovascular Disease

## 2016-07-30 MED FILL — ?CHLORTHALIDONE 25 MG TABLE: 25 | 30 days supply | Qty: 30 | Fill #4

## 2016-07-30 MED FILL — ATORVASTATIN 40 MG TABLET: 40 | 30 days supply | Qty: 30 | Fill #4

## 2016-08-07 NOTE — Progress Notes (Signed)
Cardiology Office Note:    Date:  08/08/2016   ID:  EARNIE ROCKHOLD, DOB [birth year], MRN [id number]  PCP:  Pete Glatter, MD  Cardiologist:  Dr. Tonny Bollman   Electrophysiologist:  N/a Nephrologist: Dr. Camille Bal  Referring MD: Pete Glatter, MD   Chief Complaint  Patient presents with  . Shortness of Breath    History of Present Illness:    Andrea Clements is a 50 y.o. female with a hx of DCM, prior stroke, malignant HTN, CKD.  In 2015, she presented with an acute CVA involving the L basal ganglia and an Echo demonstrated inf wall motion abnormality with an EF 30-35%.  She was tx conservatively for her DCM given recent CVA, obesity and CKD.  Repeat Echo in 12/16 demonstrated improved LVF with EF 50-55%, mod LVH, mod diastolic dysfunction.    She has been followed in the hypertension clinic and was last seen in 5/17.  She is intol of Clonidine patch and pill 2/2 dry mouth, lethargy, skin irritation.  She is allergic to ACE inhibitors due to anaphylaxis.  Amlodipine was DC'd in the past 2/2 DCM and LE edema.    Last seen by me 10/31/15. Last visit with HTN Clinic in 5/17.  She underwent dialysis access with Dr. Myra Gianotti 07/23/16.  She returns today with c/o's leg cramps at night.  She also notes increasing dyspnea on exertion.  She is NYHA 2-2b.  She denies orthopnea, PND, edema. She denies chest pain. She denies syncope.  She still smokes.  She has a mild cough at times. Denies wheezing.  Denies any bleeding issues.  She has changed to a vegetarian diet.  She admits to being fatigued.  She never did get her sleep study done.     Prior CV studies that were reviewed today include:    Renal Artery Duplex 03/19/16 Study was technically difficult and limited due to patient body habitus and overlying bowel gas. There is no obvious evidence of hemodynamically significant renal artery stenosis bilaterally involving visualized segments.  Echo 10/29/15 Moderate LVH of the  posterior wall, EF 50-55%, diffuse HK, grade 2 diastolic dysfunction, mild AI, trivial MR, mild LAE, normal RV function, increased LA pressure  Echo 01/16/15 Moderate LVH, EF 30-35%, normal wall motion, grade 2 diastolic dysfunction, moderate LAE, mild TR  Carotid US 10/03/14 Bilateral ICA 1-39%  Past Medical History:  Diagnosis Date  . Anxiety   . Arthritis   . Asthma   . Cardiomyopathy ([facility])    a. 09/2014 Echo: EF 30-35%, inflat, inf, infsept AK, Gr 2 DD-->No ischemic w/u 2/2 recent CVA and body habitus.  . CKD (chronic kidney disease), stage III   . CVA (cerebral infarction)    a. Acute sub cm LEFT basal ganglia/internal capsule lacunar infarct, normal MRA.  . Depression   . GERD (gastroesophageal reflux disease)   . Hypertension   . Morbid obesity ([facility])   . Pneumonia    history  . Stroke ([facility])   . Tobacco abuse     Past Surgical History:  Procedure Laterality Date  . ABDOMINAL HYSTERECTOMY    . AV FISTULA PLACEMENT Left 07/23/2016   Procedure: ARTERIOVENOUS (AV) FISTULA CREATION LEFT BRACHIOCEPHALIC;  Surgeon: Nada Libman, MD;  Location: [facility] OR;  Service: Vascular;  Laterality: Left;  . CESAREAN SECTION      Current Medications: Current Meds  Medication Sig  . albuterol (PROVENTIL HFA;VENTOLIN HFA) 108 (90 BASE) MCG/ACT inhaler Inhale 2 puffs into the lungs  every 6 (six) hours as needed for wheezing or shortness of breath.  Marland Kitchen. amLODipine (NORVASC) 10 MG tablet TAKE ONE TABLET BY MOUTH ONCE DAILY  . atorvastatin (LIPITOR) 40 MG tablet Take 1 tablet (40 mg total) by mouth daily.  . chlorthalidone (HYGROTON) 25 MG tablet Take 1 tablet (25 mg total) by mouth daily.  . clopidogrel (PLAVIX) 75 MG tablet Take 1 tablet (75 mg total) by mouth daily with breakfast.  . hydrALAZINE (APRESOLINE) 25 MG tablet Take 25 mg by mouth daily.  . isosorbide mononitrate (IMDUR) 30 MG 24 hr tablet Take 1 tablet (30 mg total) by mouth daily.  . metoprolol succinate (TOPROL-XL) 100 MG 24 hr  tablet Take 1 tablet (100 mg total) by mouth daily. Take with or immediately following a meal.  . pantoprazole (PROTONIX) 40 MG tablet Take 40 mg by mouth daily.  . potassium chloride SA (K-DUR,KLOR-CON) 20 MEQ tablet Take 20 mEq by mouth 2 (two) times daily.  Marland Kitchen. spironolactone (ALDACTONE) 25 MG tablet Take 1 tablet (25 mg total) by mouth daily.  . [DISCONTINUED] glucose blood test strip Use as instructed  . [DISCONTINUED] oxyCODONE-acetaminophen (PERCOCET) 5-325 MG tablet Take 1 tablet by mouth every 6 (six) hours as needed for severe pain.  . [DISCONTINUED] TRUEPLUS LANCETS 26G MISC 1 each by Does not apply route every 8 (eight) hours as needed.     Allergies:   Altace [ramipril] and Aspirin   Social History   Social History  . Marital status: Single    Spouse name: N/A  . Number of children: N/A  . Years of education: N/A   Social History Main Topics  . Smoking status: Current Every Day Smoker    Packs/day: 1.00    Years: 30.00    Types: Cigarettes  . Smokeless tobacco: Never Used  . Alcohol use No  . Drug use: No  . Sexual activity: Not Asked   Other Topics Concern  . None   Social History Narrative  . None     Family History:  The patient's family history includes Cancer in her mother; Hypertension in her father, maternal grandfather, maternal grandmother, mother, paternal grandfather, and paternal grandmother.   ROS:   Please see the history of present illness.    Review of Systems  Constitution: Positive for chills, decreased appetite and malaise/fatigue.  Musculoskeletal: Positive for myalgias.  Psychiatric/Behavioral: Positive for depression.   All other systems reviewed and are negative.   EKGs/Labs/Other Test Reviewed:    EKG:  EKG is  ordered today.  The ekg ordered today demonstrates NSR, HR 64, normal axis, NSSTTW changes, 1st degree AVB, PR 212 ms, no change from prior tracing.  Recent Labs: 02/05/2016: ALT 20; Platelets 247 03/17/2016: TSH  4.50 04/08/2016: BUN 34; Creat 2.42 07/23/2016: Hemoglobin 12.9; Potassium 4.1; Sodium 145   Recent Lipid Panel    Component Value Date/Time   CHOL 220 (H) 03/17/2016 1103   TRIG 178 (H) 03/17/2016 1103   HDL 34 (L) 03/17/2016 1103   CHOLHDL 6.5 (H) 03/17/2016 1103   VLDL 36 (H) 03/17/2016 1103   LDLCALC 150 (H) 03/17/2016 1103     Physical Exam:    VS:  BP 132/82   Pulse 64   Ht 5\' 1"  (1.549 m)   Wt 267 lb (121.1 kg)   BMI 50.45 kg/m     Wt Readings from Last 3 Encounters:  08/08/16 267 lb (121.1 kg)  07/23/16 266 lb (120.7 kg)  07/21/16 266 lb 6.4 oz (120.8  kg)     Physical Exam  Constitutional: She is oriented to person, place, and time. She appears well-developed and well-nourished. No distress.  HENT:  Head: Normocephalic and atraumatic.  Eyes: No scleral icterus.  Neck: No JVD present.  Cardiovascular: Normal rate, regular rhythm and normal heart sounds.   No murmur heard. Pulmonary/Chest: She has decreased breath sounds. She has wheezes in the right middle field and the right lower field. She has no rhonchi. She has no rales.  Abdominal: There is no tenderness.  Musculoskeletal: She exhibits no edema.  Neurological: She is alert and oriented to person, place, and time.  Skin: Skin is warm and dry.  Psychiatric: She has a normal mood and affect.    ASSESSMENT:    1. Dyspnea   2. Hypertensive heart and chronic kidney disease with heart failure and stage 1 through stage 4 chronic kidney disease, or chronic kidney disease (HCC)   3. Chronic diastolic CHF (congestive heart failure) (HCC)   4. CKD (chronic kidney disease) stage 4, GFR 15-29 ml/min (HCC)   5. Tobacco abuse   6. Other fatigue    PLAN:    In order of problems listed above:  1. Dyspnea - She notes NYHA 2b symptoms.  She does not look particularly volume overloaded.  But, she does have mod diastolic dysfunction and she feels a little bloated. Weight is down.  She has changed to a vegetarian diet  but denies any increased salt.  She also continues to smoke.  Will check BMET, BNP.  If BNP high, will change Chlorthalidone to Lasix.  I have asked her to take 1 extra dose of Chlorthalidone today to see how this helps her breathing.  Also get CXR.  If dyspnea continues, will need to consider repeating Echo.   2. Hypertensive heart disease with CHF and CKD - BP controlled.  Continue hydralazine, amlodipine, indapamide, isosorbide, metoprolol succinate, spironolactone.   3. Chronic diastolic CHF - I suspect her volume will increasingly be difficult to control in the setting of CKD. As noted, she has mod diastolic dysfunction on last Echo.  I will check BNP and CXR today.  If BNP high or edema on CXR, will DC Chlorthalidone and start Lasix.  4. CKD - She recently underwent dialysis access placement.  FU with Dr. Eliott Nine as planned.   5. Tobacco Abuse -  She is still trying to quit.   6. Fatigue - She notes recent change to vegetarian diet.  Also, she never did get sleep study performed.  She is having a lot of leg cramps.  Will get BMET, Mg2+, CBC.  TSH earlier this year was normal.  Will discuss sleep study again at FU.     Medication Adjustments/Labs and Tests Ordered: Current medicines are reviewed at length with the patient today.  Concerns regarding medicines are outlined above.  Medication changes, Labs and Tests ordered today are outlined in the Patient Instructions noted below. Patient Instructions  Medication Instructions:  1. TAKE AN EXTRA CHLORTHALIDONE TODAY ONLY  Labwork: 1. TODAY BMET, CBC W/DIFF, MAGNESIUM LEVEL, BNP  Testing/Procedures: CHEST X-RAY TODAY THIS IS TO BE DONE AT [address] [facility]  Follow-Up: 09/01/16 @ 10:45 WITH Izea Livolsi, PAC   Any Other Special Instructions Will Be Listed Below (If Applicable).  If you need a refill on your cardiac medications before your next appointment, please call your pharmacy.  Signed, Tereso Newcomer,  PA-C  08/08/2016 9:45 AM    [facility] [place], [place], [place]  [address] Phone: [phone]; Fax: [phone]

## 2016-08-08 ENCOUNTER — Telehealth: Payer: Self-pay | Admitting: *Deleted

## 2016-08-08 ENCOUNTER — Ambulatory Visit
Admission: RE | Admit: 2016-08-08 | Discharge: 2016-08-08 | Disposition: A | Discharge status: Home / Self Care | Payer: Medicaid Other | Source: Ambulatory Visit | Attending: Physician Assistant | Admitting: Physician Assistant

## 2016-08-08 ENCOUNTER — Ambulatory Visit (INDEPENDENT_AMBULATORY_CARE_PROVIDER_SITE_OTHER): Payer: Medicaid Other | Admitting: Physician Assistant

## 2016-08-08 ENCOUNTER — Encounter: Payer: Self-pay | Admitting: Physician Assistant

## 2016-08-08 VITALS — BP 132/82 | HR 64 | Ht 61.0 in | Wt 267.0 lb

## 2016-08-08 DIAGNOSIS — I509 Heart failure, unspecified: Secondary | ICD-10-CM

## 2016-08-08 DIAGNOSIS — I5032 Chronic diastolic (congestive) heart failure: Secondary | ICD-10-CM

## 2016-08-08 DIAGNOSIS — N184 Chronic kidney disease, stage 4 (severe): Secondary | ICD-10-CM

## 2016-08-08 DIAGNOSIS — N189 Chronic kidney disease, unspecified: Secondary | ICD-10-CM

## 2016-08-08 DIAGNOSIS — I130 Hypertensive heart and chronic kidney disease with heart failure and stage 1 through stage 4 chronic kidney disease, or unspecified chronic kidney disease: Secondary | ICD-10-CM | POA: Diagnosis not present

## 2016-08-08 DIAGNOSIS — Z720 Tobacco use: Secondary | ICD-10-CM

## 2016-08-08 DIAGNOSIS — R0600 Dyspnea, unspecified: Secondary | ICD-10-CM

## 2016-08-08 DIAGNOSIS — R5383 Other fatigue: Secondary | ICD-10-CM

## 2016-08-08 LAB — BASIC METABOLIC PANEL
BUN: 43 mg/dL — ABNORMAL HIGH (ref 7–25)
CALCIUM: 9.5 mg/dL (ref 8.6–10.2)
CO2: 25 mmol/L (ref 20–31)
Chloride: 107 mmol/L (ref 98–110)
Creat: 2.71 mg/dL — ABNORMAL HIGH (ref 0.50–1.10)
GLUCOSE: 93 mg/dL (ref 65–99)
Potassium: 4 mmol/L (ref 3.5–5.3)
SODIUM: 143 mmol/L (ref 135–146)

## 2016-08-08 LAB — CBC WITH DIFFERENTIAL/PLATELET
BASOS ABS: 77 {cells}/uL (ref 0–200)
Basophils Relative: 1 %
EOS ABS: 385 {cells}/uL (ref 15–500)
Eosinophils Relative: 5 %
HEMATOCRIT: 38.6 % (ref 35.0–45.0)
HEMOGLOBIN: 12.6 g/dL (ref 11.7–15.5)
Lymphocytes Relative: 32 %
Lymphs Abs: 2464 cells/uL (ref 850–3900)
MCH: 29 pg (ref 27.0–33.0)
MCHC: 32.6 g/dL (ref 32.0–36.0)
MCV: 88.7 fL (ref 80.0–100.0)
MONOS PCT: 7 %
MPV: 10.5 fL (ref 7.5–12.5)
Monocytes Absolute: 539 cells/uL (ref 200–950)
NEUTROS ABS: 4235 {cells}/uL (ref 1500–7800)
Neutrophils Relative %: 55 %
PLATELETS: 276 10*3/uL (ref 140–400)
RBC: 4.35 MIL/uL (ref 3.80–5.10)
RDW: 15 % (ref 11.0–15.0)
WBC: 7.7 10*3/uL (ref 3.8–10.8)

## 2016-08-08 LAB — MAGNESIUM: Magnesium: 2.1 mg/dL (ref 1.5–2.5)

## 2016-08-08 LAB — BRAIN NATRIURETIC PEPTIDE: Brain Natriuretic Peptide: 20.3 pg/mL (ref <100)

## 2016-08-08 NOTE — Telephone Encounter (Signed)
Pt notified of CXR results by phone with verbal understanding.  

## 2016-08-08 NOTE — Patient Instructions (Addendum)
Medication Instructions:  1. TAKE AN EXTRA CHLORTHALIDONE TODAY ONLY  Labwork: 1. TODAY BMET, CBC W/DIFF, MAGNESIUM LEVEL, BNP  Testing/Procedures: CHEST X-RAY TODAY THIS IS TO BE DONE AT [address] [facility]  Follow-Up: 09/01/16 @ 10:45 WITH SCOTT WEAVER, PAC   Any Other Special Instructions Will Be Listed Below (If Applicable).  If you need a refill on your cardiac medications before your next appointment, please call your pharmacy.

## 2016-08-11 ENCOUNTER — Telehealth: Payer: Self-pay | Admitting: *Deleted

## 2016-08-11 NOTE — Telephone Encounter (Signed)
Pt notified of lab results by phone with verbal understanding. I will fax lab results to Nephrologist as well.

## 2016-08-18 ENCOUNTER — Encounter: Payer: Self-pay | Admitting: Physician Assistant

## 2016-08-28 ENCOUNTER — Encounter: Payer: Self-pay | Admitting: Surgery

## 2016-09-01 ENCOUNTER — Encounter: Payer: Self-pay | Admitting: Physician Assistant

## 2016-09-01 ENCOUNTER — Ambulatory Visit (INDEPENDENT_AMBULATORY_CARE_PROVIDER_SITE_OTHER): Payer: Self-pay | Admitting: Surgery

## 2016-09-01 ENCOUNTER — Ambulatory Visit (HOSPITAL_COMMUNITY)
Admission: RE | Admit: 2016-09-01 | Discharge: 2016-09-01 | Disposition: A | Discharge status: Home / Self Care | Payer: Medicaid Other | Source: Ambulatory Visit | Attending: Surgery | Admitting: Surgery

## 2016-09-01 ENCOUNTER — Ambulatory Visit (INDEPENDENT_AMBULATORY_CARE_PROVIDER_SITE_OTHER): Payer: Medicaid Other | Admitting: Physician Assistant

## 2016-09-01 VITALS — BP 128/68 | HR 74 | Ht 61.0 in | Wt 273.8 lb

## 2016-09-01 VITALS — BP 130/64 | HR 72 | Temp 97.3°F | Resp 24 | Ht 61.0 in | Wt 273.0 lb

## 2016-09-01 DIAGNOSIS — N184 Chronic kidney disease, stage 4 (severe): Secondary | ICD-10-CM | POA: Diagnosis not present

## 2016-09-01 DIAGNOSIS — N186 End stage renal disease: Secondary | ICD-10-CM | POA: Diagnosis not present

## 2016-09-01 DIAGNOSIS — Z4931 Encounter for adequacy testing for hemodialysis: Secondary | ICD-10-CM | POA: Insufficient documentation

## 2016-09-01 DIAGNOSIS — I5032 Chronic diastolic (congestive) heart failure: Secondary | ICD-10-CM

## 2016-09-01 DIAGNOSIS — R0602 Shortness of breath: Secondary | ICD-10-CM

## 2016-09-01 DIAGNOSIS — I130 Hypertensive heart and chronic kidney disease with heart failure and stage 1 through stage 4 chronic kidney disease, or unspecified chronic kidney disease: Secondary | ICD-10-CM

## 2016-09-01 DIAGNOSIS — Z720 Tobacco use: Secondary | ICD-10-CM

## 2016-09-01 NOTE — Progress Notes (Signed)
Cardiology Office Note:    Date:  09/01/2016   ID:  Alinda MoneyCherise M Sickles, DOB 06/13/1966, MRN [id number]  PCP:  Pete Glatterawn T Langeland, MD  Cardiologist:  Dr. Tonny BollmanMichael Cooper   Electrophysiologist:  N/a Nephrologist: Dr. Camille Balynthia Dunham  Referring MD: Pete GlatterLangeland, Dawn T, MD   Chief Complaint  Patient presents with  . Follow-up    Shortness of breath    History of Present Illness:    Loneta Jamal CollinM Willis is a 50 y.o. female with a hx of DCM, prior stroke, malignant HTN, CKD.  In 2015, she presented with an acute CVA involving the L basal ganglia and an Echo demonstrated inf wall motion abnormality with an EF 30-35%.  She was tx conservatively for her DCM given recent CVA, obesity and CKD.  Repeat Echo in 12/16 demonstrated improved LVF with EF 50-55%, mod LVH, mod diastolic dysfunction.    She has been followed in the hypertension clinic and was last seen in 5/17.  She is intol of Clonidine patch and pill 2/2 dry mouth, lethargy, skin irritation.  She is allergic to ACE inhibitors due to anaphylaxis.  Amlodipine was DC'd in the past 2/2 DCM and LE edema.    I saw her 9/17 with complaints of dyspnea on exertion.  I had her get a BNP and a CXR.  Both were normal.  She returns for FU.  Her breathing is better.  She is still smoking.  Denies chest pain, syncope, orthopnea, PND, edema.  Denies any bleeding problems but has noted some bruising.   Prior CV studies that were reviewed today include:    Renal Artery Duplex 03/19/16 Study was technically difficult and limited due to patient body habitus and overlying bowel gas. There is no obvious evidence of hemodynamically significant renal artery stenosis bilaterally involving visualized segments.  Echo 10/29/15 Moderate LVH of the posterior wall, EF 50-55%, diffuse HK, grade 2 diastolic dysfunction, mild AI, trivial MR, mild LAE, normal RV function, increased LA pressure  Echo 01/16/15 Moderate LVH, EF 30-35%, normal wall motion, grade 2 diastolic  dysfunction, moderate LAE, mild TR  Carotid US 10/03/14 Bilateral ICA 1-39%  Past Medical History:  Diagnosis Date  . Anxiety   . Arthritis   . Asthma   . Cardiomyopathy (HCC)    a. 09/2014 Echo: EF 30-35%, inflat, inf, infsept AK, Gr 2 DD-->No ischemic w/u 2/2 recent CVA and body habitus.  . CKD (chronic kidney disease), stage III   . CVA (cerebral infarction)    a. Acute sub cm LEFT basal ganglia/internal capsule lacunar infarct, normal MRA.  . Depression   . GERD (gastroesophageal reflux disease)   . Hypertension   . Morbid obesity (HCC)   . Pneumonia    history  . Stroke (HCC)   . Tobacco abuse     Past Surgical History:  Procedure Laterality Date  . ABDOMINAL HYSTERECTOMY    . AV FISTULA PLACEMENT Left 07/23/2016   Procedure: ARTERIOVENOUS (AV) FISTULA CREATION LEFT BRACHIOCEPHALIC;  Surgeon: Nada LibmanVance W Brabham, MD;  Location: [facility] OR;  Service: Vascular;  Laterality: Left;  . CESAREAN SECTION      Current Medications: Current Meds  Medication Sig  . albuterol (PROVENTIL HFA;VENTOLIN HFA) 108 (90 BASE) MCG/ACT inhaler Inhale 2 puffs into the lungs every 6 (six) hours as needed for wheezing or shortness of breath.  Marland Kitchen. amLODipine (NORVASC) 10 MG tablet TAKE ONE TABLET BY MOUTH ONCE DAILY  . atorvastatin (LIPITOR) 40 MG tablet Take 1 tablet (40 mg total) by  mouth daily.  . chlorthalidone (HYGROTON) 25 MG tablet Take 1 tablet (25 mg total) by mouth daily.  . clopidogrel (PLAVIX) 75 MG tablet Take 1 tablet (75 mg total) by mouth daily with breakfast.  . hydrALAZINE (APRESOLINE) 25 MG tablet Take 25 mg by mouth daily.  . isosorbide mononitrate (IMDUR) 30 MG 24 hr tablet Take 1 tablet (30 mg total) by mouth daily.  . metoprolol succinate (TOPROL-XL) 100 MG 24 hr tablet Take 1 tablet (100 mg total) by mouth daily. Take with or immediately following a meal.  . pantoprazole (PROTONIX) 40 MG tablet Take 40 mg by mouth daily.  . potassium chloride SA (K-DUR,KLOR-CON) 20 MEQ tablet  Take 20 mEq by mouth 2 (two) times daily.  Marland Kitchen spironolactone (ALDACTONE) 25 MG tablet Take 1 tablet (25 mg total) by mouth daily.     Allergies:   Altace [ramipril] and Aspirin   Social History   Social History  . Marital status: Single    Spouse name: N/A  . Number of children: N/A  . Years of education: N/A   Social History Main Topics  . Smoking status: Current Every Day Smoker    Packs/day: 1.00    Years: 30.00    Types: Cigarettes  . Smokeless tobacco: Never Used  . Alcohol use No  . Drug use: No  . Sexual activity: Not Asked   Other Topics Concern  . None   Social History Narrative  . None     Family History:  The patient's family history includes Cancer in her mother; Hypertension in her father, maternal grandfather, maternal grandmother, mother, paternal grandfather, and paternal grandmother.   ROS:   Please see the history of present illness.    Review of Systems  Musculoskeletal: Positive for myalgias.  Psychiatric/Behavioral: Positive for depression.   All other systems reviewed and are negative.   EKGs/Labs/Other Test Reviewed:    EKG:  EKG is not  ordered today.  The ekg ordered today demonstrates n/a  Recent Labs: 02/05/2016: ALT 20 03/17/2016: TSH 4.50 08/08/2016: Brain Natriuretic Peptide 20.3; BUN 43; Creat 2.71; Hemoglobin 12.6; Magnesium 2.1; Platelets 276; Potassium 4.0; Sodium 143   Recent Lipid Panel    Component Value Date/Time   CHOL 220 (H) 03/17/2016 1103   TRIG 178 (H) 03/17/2016 1103   HDL 34 (L) 03/17/2016 1103   CHOLHDL 6.5 (H) 03/17/2016 1103   VLDL 36 (H) 03/17/2016 1103   LDLCALC 150 (H) 03/17/2016 1103     Physical Exam:    VS:  BP 128/68   Pulse 74   Ht 5\' 1"  (1.549 m)   Wt 273 lb 12.8 oz (124.2 kg)   BMI 51.73 kg/m     Wt Readings from Last 3 Encounters:  09/01/16 273 lb 12.8 oz (124.2 kg)  08/08/16 267 lb (121.1 kg)  07/23/16 266 lb (120.7 kg)     Physical Exam  Constitutional: She is oriented to person,  place, and time. She appears well-developed and well-nourished. No distress.  HENT:  Head: Normocephalic and atraumatic.  Eyes: No scleral icterus.  Neck: No JVD present.  Cardiovascular: Normal rate, regular rhythm and normal heart sounds.   No murmur heard. Pulmonary/Chest: Effort normal. She has no wheezes. She has no rales.  Abdominal: Soft. There is no tenderness.  Musculoskeletal: She exhibits no edema.  Neurological: She is alert and oriented to person, place, and time.  Skin: Skin is warm and dry. Ecchymosis noted.  Small to Mod sized bruising noted L thigh  Psychiatric: She has a normal mood and affect.    ASSESSMENT:    1. Shortness of breath   2. Hypertensive heart and chronic kidney disease with heart failure and stage 1 through stage 4 chronic kidney disease, or chronic kidney disease (HCC)   3. Chronic diastolic CHF (congestive heart failure) (HCC)   4. CKD (chronic kidney disease) stage 4, GFR 15-29 ml/min (HCC)   5. Tobacco abuse    PLAN:    In order of problems listed above:  1. Dyspnea - Breathing is improved. Recent chest X-ray and BNP were normal.  I suspect her dyspnea on exertion is multifactorial and related to diastolic CHF, deconditioning and smoking.  She is trying to quit smoking. No further testing needed at this time.  2. HTN Heart Disease - BP looks very good.  It is optimal on current regimen.   -  Continue hydralazine, amlodipine, indapamide, isosorbide, metoprolol succinate, spironolactone.   3. Chronic diastolic CHF - Volume appears stable.   4. CKD - Followed by Nephrology.  5. Tobacco abuse - She knows that she needs to quit. We discussed different strategies for quitting.    Medication Adjustments/Labs and Tests Ordered: Current medicines are reviewed at length with the patient today.  Concerns regarding medicines are outlined above.  Medication changes, Labs and Tests ordered today are outlined in the Patient Instructions noted  below. Patient Instructions  Medication Instructions:  Your physician recommends that you continue on your current medications as directed. Please refer to the Current Medication list given to you today.  Labwork: NONE  Testing/Procedures: NONE  Follow-Up: DR. Excell Seltzer 4 MONTHS; WE WILL SEND OUT A REMINDER LETTER A COUPLE OF MONTHS TO CALL AND MAKE AN APPT.   Any Other Special Instructions Will Be Listed Below (If Applicable).  If you need a refill on your cardiac medications before your next appointment, please call your pharmacy.  Signed, Tereso Newcomer, PA-C  09/01/2016 11:49 AM    [facility] [address] [place], [place], [place]  [address] Phone: [phone]; Fax: [phone]

## 2016-09-01 NOTE — Progress Notes (Signed)
   Patient name: Andrea Clements MRN: [id number] DOB: July 08, 1966 Sex: female  REASON FOR VISIT: post op  HPI: Andrea Clements is a 50 y.o. female who returns today for follow-up.  She is status post left radiocephalic fistula on 07/23/2016.  She is not yet on dialysis.  She denies any symptoms of steal.  She denies any pain in her incision.  Current Outpatient Prescriptions  Medication Sig Dispense Refill  . albuterol (PROVENTIL HFA;VENTOLIN HFA) 108 (90 BASE) MCG/ACT inhaler Inhale 2 puffs into the lungs every 6 (six) hours as needed for wheezing or shortness of breath. 1 Inhaler 1  . amLODipine (NORVASC) 10 MG tablet TAKE ONE TABLET BY MOUTH ONCE DAILY 90 tablet 0  . atorvastatin (LIPITOR) 40 MG tablet Take 1 tablet (40 mg total) by mouth daily. 30 tablet 11  . chlorthalidone (HYGROTON) 25 MG tablet Take 1 tablet (25 mg total) by mouth daily. 30 tablet 11  . clopidogrel (PLAVIX) 75 MG tablet Take 1 tablet (75 mg total) by mouth daily with breakfast. 90 tablet 3  . hydrALAZINE (APRESOLINE) 25 MG tablet Take 25 mg by mouth daily.    . isosorbide mononitrate (IMDUR) 30 MG 24 hr tablet Take 1 tablet (30 mg total) by mouth daily. 90 tablet 3  . metoprolol succinate (TOPROL-XL) 100 MG 24 hr tablet Take 1 tablet (100 mg total) by mouth daily. Take with or immediately following a meal. 90 tablet 3  . pantoprazole (PROTONIX) 40 MG tablet Take 40 mg by mouth daily.    . potassium chloride SA (K-DUR,KLOR-CON) 20 MEQ tablet Take 20 mEq by mouth 2 (two) times daily.    Marland Kitchen spironolactone (ALDACTONE) 25 MG tablet Take 1 tablet (25 mg total) by mouth daily. 90 tablet 3   No current facility-administered medications for this visit.     REVIEW OF SYSTEMS:  [X]  denotes positive finding, [ ]  denotes negative finding Cardiac  Comments:  Chest pain or chest pressure:    Shortness of breath upon exertion:    Short of breath when lying flat:    Irregular heart rhythm:      Constitutional    Fever or chills:      PHYSICAL EXAM: Vitals:   09/01/16 1502  BP: 130/64  Pulse: 72  Resp: (!) 24  Temp: 97.3 F (36.3 C)  TempSrc: Oral  SpO2: 95%  Weight: 273 lb (123.8 kg)  Height: 5\' 1"  (1.549 m)    GENERAL: The patient is a well-nourished female, in no acute distress. The vital signs are documented above. CARDIOVASCULAR: There is a regular rate and rhythm. PULMONARY: There is good air exchange bilaterally without wheezing or rales. I can palpate her fistula, however it does feel deep.  Vein mapping showed that the cephalic vein measures between 0.54-0.77 cm.  The depth ranges from 0.42-0.64  MEDICAL ISSUES: The patient's vein is maturing nicely.  I'm concerned that it may be a little deep despite what the ultrasound measurements show, it is difficult to palpate.  Since she is not yet on dialysis, I have elected to observe this for another 3 months and have her reevaluated.  If he continues to be difficult to palpate, I would recommend elevation.  Durene Cal, MD [facility] [phone] Pager [phone]

## 2016-09-01 NOTE — Patient Instructions (Addendum)
Medication Instructions:  Your physician recommends that you continue on your current medications as directed. Please refer to the Current Medication list given to you today.  Labwork: NONE  Testing/Procedures: NONE  Follow-Up: DR. Excell Seltzer 4 MONTHS; WE WILL SEND OUT A REMINDER LETTER A COUPLE OF MONTHS TO CALL AND MAKE AN APPT.   Any Other Special Instructions Will Be Listed Below (If Applicable).  If you need a refill on your cardiac medications before your next appointment, please call your pharmacy.

## 2016-09-09 DIAGNOSIS — Z0279 Encounter for issue of other medical certificate: Secondary | ICD-10-CM | POA: Diagnosis not present

## 2016-09-18 MED FILL — ?CHLORTHALIDONE 25 MG TABLE: 25 | 30 days supply | Qty: 30 | Fill #5

## 2016-09-18 MED FILL — ATORVASTATIN 40 MG TABLET: 40 | 30 days supply | Qty: 30 | Fill #5

## 2016-09-22 MED FILL — SPIRONOLACTONE 25 MG TABLET: 25 | 30 days supply | Qty: 30 | Fill #0 | Status: TO

## 2016-09-22 MED FILL — CLOPIDOGREL 75 MG TABLET: 75 | 30 days supply | Qty: 30 | Fill #0

## 2016-09-22 MED FILL — METOPROLOL SUCC ER 100 MG T: 100 | 30 days supply | Qty: 30 | Fill #0

## 2016-09-22 MED FILL — hydrALAZINE HCL 25 MG TABS: 25 | 30 days supply | Qty: 90 | Fill #0

## 2016-09-22 MED FILL — CALCITRIOL 0.25 MCG CAPSULE: 0.25 | 30 days supply | Qty: 16 | Fill #0

## 2016-09-22 MED FILL — ISOSORBIDE MN ER 30 MG TAB: 30 | 30 days supply | Qty: 30 | Fill #0

## 2016-10-06 ENCOUNTER — Encounter: Payer: Self-pay | Admitting: Internal Medicine

## 2016-10-06 ENCOUNTER — Ambulatory Visit: Payer: Medicaid Other | Attending: Internal Medicine | Admitting: Internal Medicine

## 2016-10-06 VITALS — BP 157/93 | HR 77 | Temp 98.3°F | Resp 16 | Wt 276.6 lb

## 2016-10-06 DIAGNOSIS — J45909 Unspecified asthma, uncomplicated: Secondary | ICD-10-CM | POA: Diagnosis not present

## 2016-10-06 DIAGNOSIS — F17210 Nicotine dependence, cigarettes, uncomplicated: Secondary | ICD-10-CM | POA: Diagnosis not present

## 2016-10-06 DIAGNOSIS — I5032 Chronic diastolic (congestive) heart failure: Secondary | ICD-10-CM | POA: Insufficient documentation

## 2016-10-06 DIAGNOSIS — F419 Anxiety disorder, unspecified: Secondary | ICD-10-CM | POA: Insufficient documentation

## 2016-10-06 DIAGNOSIS — I130 Hypertensive heart and chronic kidney disease with heart failure and stage 1 through stage 4 chronic kidney disease, or unspecified chronic kidney disease: Secondary | ICD-10-CM | POA: Insufficient documentation

## 2016-10-06 DIAGNOSIS — Z6841 Body Mass Index (BMI) 40.0 and over, adult: Secondary | ICD-10-CM | POA: Insufficient documentation

## 2016-10-06 DIAGNOSIS — Z720 Tobacco use: Secondary | ICD-10-CM

## 2016-10-06 DIAGNOSIS — R7303 Prediabetes: Secondary | ICD-10-CM | POA: Diagnosis not present

## 2016-10-06 DIAGNOSIS — Z23 Encounter for immunization: Secondary | ICD-10-CM | POA: Diagnosis not present

## 2016-10-06 DIAGNOSIS — F329 Major depressive disorder, single episode, unspecified: Secondary | ICD-10-CM | POA: Diagnosis not present

## 2016-10-06 DIAGNOSIS — Z8673 Personal history of transient ischemic attack (TIA), and cerebral infarction without residual deficits: Secondary | ICD-10-CM | POA: Diagnosis not present

## 2016-10-06 DIAGNOSIS — I429 Cardiomyopathy, unspecified: Secondary | ICD-10-CM | POA: Diagnosis not present

## 2016-10-06 DIAGNOSIS — N183 Chronic kidney disease, stage 3 (moderate): Secondary | ICD-10-CM | POA: Insufficient documentation

## 2016-10-06 DIAGNOSIS — K219 Gastro-esophageal reflux disease without esophagitis: Secondary | ICD-10-CM | POA: Insufficient documentation

## 2016-10-06 DIAGNOSIS — E559 Vitamin D deficiency, unspecified: Secondary | ICD-10-CM

## 2016-10-06 LAB — GLUCOSE, POCT (MANUAL RESULT ENTRY): POC GLUCOSE: 163 mg/dL — AB (ref 70–99)

## 2016-10-06 LAB — POCT GLYCOSYLATED HEMOGLOBIN (HGB A1C): HEMOGLOBIN A1C: 6.3

## 2016-10-06 MED ORDER — GLIMEPIRIDE 1 MG PO TABS: 1.0000 mg | ORAL_TABLET | Freq: Every day | ORAL | 3 refills | Status: AC

## 2016-10-06 NOTE — Progress Notes (Signed)
Pt is in the office today for hypertension Pt states she is not in any pain Pt states she is having a lot bruises when she wakes up Pt states she is wanting to know if it is coming from the plavix

## 2016-10-06 NOTE — Patient Instructions (Addendum)
QUICK START PATIENT GUIDE TO LCHF/IF LOW CARB HIGH FAT / INTERMITTENT FASTING  Recommend: <50 gram carbohydrate a day for weightloss.  What is this diet and how does it work? o Insulin is a hormone made by your body that allows you to use sugar (glucose) from carbohydrates in the food you eat for energy or to store glucose (as fat) for future use  o Insulin levels need to be lowered in order to utilize our stored energy (fat) o Many struggling with obesity are insulin resistant and have high levels of insulin o This diet works to lower your insulin in two ways o Fasting - allows your insulin levels to naturally decrease  o Avoiding carbohydrates - carbs trigger increase in insulin Low Carb Healthy Fat (LCHF) o Get a free app for your phone, such as MyFitnessPal, to help you track your macronutrients (carbs/protein/fats) and to track your weight and body measurements to see your progress o Set your goal for around 10% carbs/20% protein/70% fat o A good starting goal for amount of net carbs per day is 50 grams (some will aim for 20 grams) o "Net carbs" refers to total grams of carbs minus grams of fiber (as fiber is not typically absorbed). For example, if a food has 5g total carb and 3g fiber, that would be 2g net carbs o Increase healthy fats - eg. olive oil, eggs, nuts, avocado, cheese, butter, coconut, meats, fish o Avoid high carb foods - eg. bread, pasta, potatoes, rice, cookies, soda, juice, anything sugary o Buy full-fat ingredients (avoid low-fat versions, which often have more sugar) o No need to count calories, but pay close attention to grams of carbs on labels Intermittent Fasting (IF) o "Fasting" is going a period of time without eating - it helps to stay busy and well-hydrated o Purpose of fasting is to allow insulin levels to drop as low as possible, allowing your body to switch into fat-burning mode o With this diet there are many approaches to fasting, but 16:8 and 24hr fasts  are commonly used o 16:8 fast, usually 5-7 days a week - Fasting for 16 hours of the day, then eating all meals for the day over course of 8 hours. o 24 hour fast, usually 1-3 days a week - Typically eating one meal a day, then fasting until the next day. Plenty of fluids (and some salt to help you hold onto fluids) are recommended during longer fasts.  o During fasts certain beverages are still acceptable - water, sparkling water, bone broth, black tea or coffee, or tea/coffee with small amount of heavy whipping cream Special note for those on diabetic medications o Discuss your medications with your physician. You may need to hold your medication or adjust to only taking when eating. Diabetics should keep close track of their blood sugars when making any changes to diet/meds, to ensure they are staying within normal limits For more info about LCHF/IF o Watch "Therapeutic Fasting - Solving the Two-Compartment Problem" video by Dr. Jason Fung on YouTube (https://www.youtube.com/watch?v=tIuj-oMN-Fk) for a great intro to these concepts o Read "The Obesity Code" and/or "The Complete Guide to Fasting" by Dr. Jason Fung o Go to www.dietdoctor.com for explanations, recipes, and infographics about foods to eat/avoid o Get a Free smartphone app that helps count carbohydrates  - ie MyKeto EXAMPLES TO GET STARTED Fasting Beverages -water (can add  tsp Pink Himalayan salt once or twice a day to help stay hydrated for longer fasts) -Sparkling water (such as   [employer] or similar; avoid any with artificial sweeteners)  -Bone broth (multiple recipes available online or can buy pre-made) -Tea or Coffee (Adding heavy whipping cream or coconut oil to your tea or coffee can be helpful if you find yourself getting too hungry during the fasts. Can also add cinnamon for flavor. Or "bulletproof coffee.") Low Carb Healthy Fat Breakfast (if not fasting) -eggs in butter or olive oil with avocado -omelet with veggies and  cheese  Lunch -hamburger with cheese and avocado wrapped in lettuce (no bun, no ketchup) -meat and cheese wrapped in lettuce (can dip in mustard or olive oil/vinegar/mayo) -salad with meat/cheese/nuts and higher fat dressing (vinaigrette or Ranch, etc) -tuna salad lettuce wrap -taco meat with cheese, sour cream, guacamole, cheese over lettuce  Dinner -steak with herb butter or Barnaise sauce -"Fathead" pizza (uses cheese and almond flour for the dough - several recipes available online) -roasted or grilled chicken with skin on, with low carb sauce (buffalo, garlic butter, alfredo, pesto, etc) -baked salmon with lemon butter -chicken alfredo with zucchini noodles -[place] butter chicken with low carb garlic naan -egg roll in a bowl  Side Dishes -mashed cauliflower (homemade or available in freezer section) -roast vegetables (green veggies that grow above ground rather than root veggies) with butter or cheese -Caprese salad (fresh mozzarella, tomato and basil with olive oil) -homemade low-carb coleslaw Snacks/Desserts (try to avoid unnecessary snacking and sweets in general) -celery or cucumber dipped in guacamole or sour cream dip -cheese and meat slices  -raspberries with whipped cream (can make homemade with no sugar added) -low carb [place] butter cake  AVOID - sugar, diet/regular soda, potatoes, breads, rice, pasta, candy, cookies, cakes, muffins, juice, high carb fruit (bananas, grapes), beer, ketchup, barbeque and other sweet sauces  -   Steps to Quit Smoking Smoking tobacco can be bad for your health. It can also affect almost every organ in your body. Smoking puts you and people around you at risk for many serious long-lasting (chronic) diseases. Quitting smoking is hard, but it is one of the best things that you can do for your health. It is never too late to quit. What are the benefits of quitting smoking? When you quit smoking, you lower your risk for getting serious  diseases and conditions. They can include:  Lung cancer or lung disease.  Heart disease.  Stroke.  Heart attack.  Not being able to have children (infertility).  Weak bones (osteoporosis) and broken bones (fractures). If you have coughing, wheezing, and shortness of breath, those symptoms may get better when you quit. You may also get sick less often. If you are pregnant, quitting smoking can help to lower your chances of having a baby of low birth weight. What can I do to help me quit smoking? Talk with your doctor about what can help you quit smoking. Some things you can do (strategies) include:  Quitting smoking totally, instead of slowly cutting back how much you smoke over a period of time.  Going to in-person counseling. You are more likely to quit if you go to many counseling sessions.  Using resources and support systems, such as:  Online chats with a Social worker.  Phone quitlines.  Printed Furniture conservator/restorer.  Support groups or group counseling.  Text messaging programs.  Mobile phone apps or applications.  Taking medicines. Some of these medicines may have nicotine in them. If you are pregnant or breastfeeding, do not take any medicines to quit smoking unless your doctor says it  is okay. Talk with your doctor about counseling or other things that can help you. Talk with your doctor about using more than one strategy at the same time, such as taking medicines while you are also going to in-person counseling. This can help make quitting easier. What things can I do to make it easier to quit? Quitting smoking might feel very hard at first, but there is a lot that you can do to make it easier. Take these steps:  Talk to your family and friends. Ask them to support and encourage you.  Call phone quitlines, reach out to support groups, or work with a Veterinary surgeoncounselor.  Ask people who smoke to not smoke around you.  Avoid places that make you want (trigger) to smoke, such  as:  Bars.  Parties.  Smoke-break areas at work.  Spend time with people who do not smoke.  Lower the stress in your life. Stress can make you want to smoke. Try these things to help your stress:  Getting regular exercise.  Deep-breathing exercises.  Yoga.  Meditating.  Doing a body scan. To do this, close your eyes, focus on one area of your body at a time from head to toe, and notice which parts of your body are tense. Try to relax the muscles in those areas.  Download or buy apps on your mobile phone or tablet that can help you stick to your quit plan. There are many free apps, such as QuitGuide from the [employer] developer(Centers for Disease Control and Prevention). You can find more support from smokefree.gov and other websites. This information is not intended to replace advice given to you by your health care provider. Make sure you discuss any questions you have with your health care provider. Document Released: 08/23/2009 Document Revised: 06/24/2016 Document Reviewed: 03/13/2015 Elsevier Interactive Patient Education  2017 Elsevier Inc.   -  Hypertension Hypertension is another name for high blood pressure. High blood pressure forces your heart to work harder to pump blood. A blood pressure reading has two numbers, which includes a higher number over a lower number (example: 110/72). Follow these instructions at home:  Have your blood pressure rechecked by your doctor.  Only take medicine as told by your doctor. Follow the directions carefully. The medicine does not work as well if you skip doses. Skipping doses also puts you at risk for problems.  Do not smoke.  Monitor your blood pressure at home as told by your doctor. Contact a doctor if:  You think you are having a reaction to the medicine you are taking.  You have repeat headaches or feel dizzy.  You have puffiness (swelling) in your ankles.  You have trouble with your vision. Get help right away if:  You get a  very bad headache and are confused.  You feel weak, numb, or faint.  You get chest or belly (abdominal) pain.  You throw up (vomit).  You cannot breathe very well. This information is not intended to replace advice given to you by your health care provider. Make sure you discuss any questions you have with your health care provider. Document Released: 04/14/2008 Document Revised: 04/03/2016 Document Reviewed: 08/19/2013 Elsevier Interactive Patient Education  2017 Elsevier Inc.  -  Low-Sodium Eating Plan Sodium raises blood pressure and causes water to be held in the body. Getting less sodium from food will help lower your blood pressure, reduce any swelling, and protect your heart, liver, and kidneys. We get sodium by adding salt (sodium  chloride) to food. Most of our sodium comes from canned, boxed, and frozen foods. Restaurant foods, fast foods, and pizza are also very high in sodium. Even if you take medicine to lower your blood pressure or to reduce fluid in your body, getting less sodium from your food is important. What is my plan? Most people should limit their sodium intake to 2,300 mg a day. Your health care provider recommends that you limit your sodium intake to 2,000mg  a day. What do I need to know about this eating plan? For the low-sodium eating plan, you will follow these general guidelines:  Choose foods with a % Daily Value for sodium of less than 5% (as listed on the food label).  Use salt-free seasonings or herbs instead of table salt or sea salt.  Check with your health care provider or pharmacist before using salt substitutes.  Eat fresh foods.  Eat more vegetables and fruits.  Limit canned vegetables. If you do use them, rinse them well to decrease the sodium.  Limit cheese to 1 oz (28 g) per day.  Eat lower-sodium products, often labeled as "lower sodium" or "no salt added."  Avoid foods that contain monosodium glutamate (MSG). MSG is sometimes added to  [place] food and some canned foods.  Check food labels (Nutrition Facts labels) on foods to learn how much sodium is in one serving.  Eat more home-cooked food and less restaurant, buffet, and fast food.  When eating at a restaurant, ask that your food be prepared with less salt, or no salt if possible. How do I read food labels for sodium information? The Nutrition Facts label lists the amount of sodium in one serving of the food. If you eat more than one serving, you must multiply the listed amount of sodium by the number of servings. Food labels may also identify foods as:  Sodium free-Less than 5 mg in a serving.  Very low sodium-35 mg or less in a serving.  Low sodium-140 mg or less in a serving.  Light in sodium-50% less sodium in a serving. For example, if a food that usually has 300 mg of sodium is changed to become light in sodium, it will have 150 mg of sodium.  Reduced sodium-25% less sodium in a serving. For example, if a food that usually has 400 mg of sodium is changed to reduced sodium, it will have 300 mg of sodium. What foods can I eat? Grains  Low-sodium cereals, including oats, puffed wheat and rice, and shredded wheat cereals. Low-sodium crackers. Unsalted rice and pasta. Lower-sodium bread. Vegetables  Frozen or fresh vegetables. Low-sodium or reduced-sodium canned vegetables. Low-sodium or reduced-sodium tomato sauce and paste. Low-sodium or reduced-sodium tomato and vegetable juices. Fruits  Fresh, frozen, and canned fruit. Fruit juice. Meat and Other Protein Products  Low-sodium canned tuna and salmon. Fresh or frozen meat, poultry, seafood, and fish. Lamb. Unsalted nuts. Dried beans, peas, and lentils without added salt. Unsalted canned beans. Homemade soups without salt. Eggs. Dairy  Milk. Soy milk. Ricotta cheese. Low-sodium or reduced-sodium cheeses. Yogurt. Condiments  Fresh and dried herbs and spices. Salt-free seasonings. Onion and garlic powders.  Low-sodium varieties of mustard and ketchup. Fresh or refrigerated horseradish. Lemon juice. Fats and Oils  Reduced-sodium salad dressings. Unsalted butter. Other  Unsalted popcorn and pretzels. The items listed above may not be a complete list of recommended foods or beverages. Contact your dietitian for more options.  What foods are not recommended? Grains  Instant hot cereals. Bread  stuffing, pancake, and biscuit mixes. Croutons. Seasoned rice or pasta mixes. Noodle soup cups. Boxed or frozen macaroni and cheese. Self-rising flour. Regular salted crackers. Vegetables  Regular canned vegetables. Regular canned tomato sauce and paste. Regular tomato and vegetable juices. Frozen vegetables in sauces. Salted [place] fries. Olives. Rosita Fire. Relishes. Sauerkraut. Salsa. Meat and Other Protein Products  Salted, canned, smoked, spiced, or pickled meats, seafood, or fish. Bacon, ham, sausage, hot dogs, corned beef, chipped beef, and packaged luncheon meats. Salt pork. Jerky. Pickled herring. Anchovies, regular canned tuna, and sardines. Salted nuts. Dairy  Processed cheese and cheese spreads. Cheese curds. Blue cheese and cottage cheese. Buttermilk. Condiments  Onion and garlic salt, seasoned salt, table salt, and sea salt. Canned and packaged gravies. Worcestershire sauce. Tartar sauce. Barbecue sauce. Teriyaki sauce. Soy sauce, including reduced sodium. Steak sauce. Fish sauce. Oyster sauce. Cocktail sauce. Horseradish that you find on the shelf. Regular ketchup and mustard. Meat flavorings and tenderizers. Bouillon cubes. Hot sauce. Tabasco sauce. Marinades. Taco seasonings. Relishes. Fats and Oils  Regular salad dressings. Salted butter. Margarine. Ghee. Bacon fat. Other  Potato and tortilla chips. Corn chips and puffs. Salted popcorn and pretzels. Canned or dried soups. Pizza. Frozen entrees and pot pies. The items listed above may not be a complete list of foods and beverages to avoid. Contact  your dietitian for more information.  This information is not intended to replace advice given to you by your health care provider. Make sure you discuss any questions you have with your health care provider. Document Released: 04/18/2002 Document Revised: 04/03/2016 Document Reviewed: 08/31/2013 Elsevier Interactive Patient Education  2017 Elsevier Inc.   -  Diabetes Mellitus and Exercise Exercising regularly is important for your overall health, especially when you have diabetes (diabetes mellitus). Exercising is not only about losing weight. It has many health benefits, such as increasing muscle strength and bone density and reducing body fat and stress. This leads to improved fitness, flexibility, and endurance, all of which result in better overall health. Exercise has additional benefits for people with diabetes, including:  Reducing appetite.  Helping to lower and control blood glucose.  Lowering blood pressure.  Helping to control amounts of fatty substances (lipids) in the blood, such as cholesterol and triglycerides.  Helping the body to respond better to insulin (improving insulin sensitivity).  Reducing how much insulin the body needs.  Decreasing the risk for heart disease by:  Lowering cholesterol and triglyceride levels.  Increasing the levels of good cholesterol.  Lowering blood glucose levels. What is my activity plan? Your health care provider or certified diabetes educator can help you make a plan for the type and frequency of exercise (activity plan) that works for you. Make sure that you:  Do at least 150 minutes of moderate-intensity or vigorous-intensity exercise each week. This could be brisk walking, biking, or water aerobics.  Do stretching and strength exercises, such as yoga or weightlifting, at least 2 times a week.  Spread out your activity over at least 3 days of the week.  Get some form of physical activity every day.  Do not go more than 2  days in a row without some kind of physical activity.  Avoid being inactive for more than 90 minutes at a time. Take frequent breaks to walk or stretch.  Choose a type of exercise or activity that you enjoy, and set realistic goals.  Start slowly, and gradually increase the intensity of your exercise over time. What do I need to know  about managing my diabetes?  Check your blood glucose before and after exercising.  If your blood glucose is higher than 240 mg/dL (16.1 mmol/L) before you exercise, check your urine for ketones. If you have ketones in your urine, do not exercise until your blood glucose returns to normal.  Know the symptoms of low blood glucose (hypoglycemia) and how to treat it. Your risk for hypoglycemia increases during and after exercise. Common symptoms of hypoglycemia can include:  Hunger.  Anxiety.  Sweating and feeling clammy.  Confusion.  Dizziness or feeling light-headed.  Increased heart rate or palpitations.  Blurry vision.  Tingling or numbness around the mouth, lips, or tongue.  Tremors or shakes.  Irritability.  Keep a rapid-acting carbohydrate snack available before, during, and after exercise to help prevent or treat hypoglycemia.  Avoid injecting insulin into areas of the body that are going to be exercised. For example, avoid injecting insulin into:  The arms, when playing tennis.  The legs, when jogging.  Keep records of your exercise habits. Doing this can help you and your health care provider adjust your diabetes management plan as needed. Write down:  Food that you eat before and after you exercise.  Blood glucose levels before and after you exercise.  The type and amount of exercise you have done.  When your insulin is expected to peak, if you use insulin. Avoid exercising at times when your insulin is peaking.  When you start a new exercise or activity, work with your health care provider to make sure the activity is safe  for you, and to adjust your insulin, medicines, or food intake as needed.  Drink plenty of water while you exercise to prevent dehydration or heat stroke. Drink enough fluid to keep your urine clear or pale yellow. This information is not intended to replace advice given to you by your health care provider. Make sure you discuss any questions you have with your health care provider. Document Released: 01/17/2004 Document Revised: 05/16/2016 Document Reviewed: 04/07/2016 Elsevier Interactive Patient Education  2017 Elsevier Inc.  Td Vaccine (Tetanus and Diphtheria): What You Need to Know 1. Why get vaccinated? Tetanus  and diphtheria are very serious diseases. They are rare in the [place] today, but people who do become infected often have severe complications. Td vaccine is used to protect adolescents and adults from both of these diseases. Both tetanus and diphtheria are infections caused by bacteria. Diphtheria spreads from person to person through coughing or sneezing. Tetanus-causing bacteria enter the body through cuts, scratches, or wounds. TETANUS (lockjaw) causes painful muscle tightening and stiffness, usually all over the body.  It can lead to tightening of muscles in the head and neck so you can't open your mouth, swallow, or sometimes even breathe. Tetanus kills about 1 out of every 10 people who are infected even after receiving the best medical care. DIPHTHERIA can cause a thick coating to form in the back of the throat.  It can lead to breathing problems, paralysis, heart failure, and death. Before vaccines, as many as 200,000 cases of diphtheria and hundreds of cases of tetanus were reported in the [place] each year. Since vaccination began, reports of cases for both diseases have dropped by about 99%. 2. Td vaccine Td vaccine can protect adolescents and adults from tetanus and diphtheria. Td is usually given as a booster dose every 10 years but it can also be given  earlier after a severe and dirty wound or burn. Another vaccine, called  Tdap, which protects against pertussis in addition to tetanus and diphtheria, is sometimes recommended instead of Td vaccine. Your doctor or the person giving you the vaccine can give you more information. Td may safely be given at the same time as other vaccines. 3. Some people should not get this vaccine  A person who has ever had a life-threatening allergic reaction after a previous dose of any tetanus or diphtheria containing vaccine, OR has a severe allergy to any part of this vaccine, should not get Td vaccine. Tell the person giving the vaccine about any severe allergies.  Talk to your doctor if you:  had severe pain or swelling after any vaccine containing diphtheria or tetanus,  ever had a condition called Guillain Barre Syndrome (GBS),  aren't feeling well on the day the shot is scheduled. 4. What are the risks from Td vaccine? With any medicine, including vaccines, there is a chance of side effects. These are usually mild and go away on their own. Serious reactions are also possible but are rare. Most people who get Td vaccine do not have any problems with it. Mild problems following Td vaccine: (Did not interfere with activities)  Pain where the shot was given (about 8 people in 10)  Redness or swelling where the shot was given (about 1 person in 4)  Mild fever (rare)  Headache (about 1 person in 4)  Tiredness (about 1 person in 4) Moderate problems following Td vaccine: (Interfered with activities, but did not require medical attention)  Fever over 102F (rare) Severe problems following Td vaccine: (Unable to perform usual activities; required medical attention)  Swelling, severe pain, bleeding and/or redness in the arm where the shot was given (rare). Problems that could happen after any vaccine:  People sometimes faint after a medical procedure, including vaccination. Sitting or lying down  for about 15 minutes can help prevent fainting, and injuries caused by a fall. Tell your doctor if you feel dizzy, or have vision changes or ringing in the ears.  Some people get severe pain in the shoulder and have difficulty moving the arm where a shot was given. This happens very rarely.  Any medication can cause a severe allergic reaction. Such reactions from a vaccine are very rare, estimated at fewer than 1 in a million doses, and would happen within a few minutes to a few hours after the vaccination. As with any medicine, there is a very remote chance of a vaccine causing a serious injury or death. The safety of vaccines is always being monitored. For more information, visit: http://floyd.org/ 5. What if there is a serious reaction? What should I look for? Look for anything that concerns you, such as signs of a severe allergic reaction, very high fever, or unusual behavior. Signs of a severe allergic reaction can include hives, swelling of the face and throat, difficulty breathing, a fast heartbeat, dizziness, and weakness. These would usually start a few minutes to a few hours after the vaccination. What should I do?  If you think it is a severe allergic reaction or other emergency that can't wait, call 9-1-1 or get the person to the nearest hospital. Otherwise, call your doctor.  Afterward, the reaction should be reported to the Vaccine Adverse Event Reporting System (VAERS). Your doctor might file this report, or you can do it yourself through the VAERS web site at www.vaers.LAgents.no, or by calling 1-2500492367.  VAERS does not give medical advice. 6. The National Vaccine Injury [employer]  Vaccine Injury Compensation Program (VICP) is a federal program that was created to compensate people who may have been injured by certain vaccines. Persons who believe they may have been injured by a vaccine can learn about the program and about filing a claim by  calling 1-5181936898 or visiting the VICP website at SpiritualWord.at. There is a time limit to file a claim for compensation. 7. How can I learn more?  Ask your doctor. He or she can give you the vaccine package insert or suggest other sources of information.  Call your local or state health department.  Contact the Centers for Disease Control and Prevention (CDC):  Call 680-535-7396 (1-800-CDC-INFO)  Visit CDC's website at PicCapture.uy CDC Td Vaccine VIS (02/19/16) This information is not intended to replace advice given to you by your health care provider. Make sure you discuss any questions you have with your health care provider. Document Released: 08/24/2006 Document Revised: 07/17/2016 Document Reviewed: 07/17/2016 Elsevier Interactive Patient Education  2017 Elsevier Inc. Influenza Virus Vaccine injection (Fluarix) What is this medicine? INFLUENZA VIRUS VACCINE (in floo EN zuh VAHY ruhs vak SEEN) helps to reduce the risk of getting influenza also known as the flu. This medicine may be used for other purposes; ask your health care provider or pharmacist if you have questions. COMMON BRAND NAME(S): Fluarix, Fluzone What should I tell my health care provider before I take this medicine? They need to know if you have any of these conditions: -bleeding disorder like hemophilia -fever or infection -Guillain-Barre syndrome or other neurological problems -immune system problems -infection with the human immunodeficiency virus (HIV) or AIDS -low blood platelet counts -multiple sclerosis -an unusual or allergic reaction to influenza virus vaccine, eggs, chicken proteins, latex, gentamicin, other medicines, foods, dyes or preservatives -pregnant or trying to get pregnant -breast-feeding How should I use this medicine? This vaccine is for injection into a muscle. It is given by a health care professional. A copy of Vaccine Information Statements will be  given before each vaccination. Read this sheet carefully each time. The sheet may change frequently. Talk to your pediatrician regarding the use of this medicine in children. Special care may be needed. Overdosage: If you think you have taken too much of this medicine contact a poison control center or emergency room at once. NOTE: This medicine is only for you. Do not share this medicine with others. What if I miss a dose? This does not apply. What may interact with this medicine? -chemotherapy or radiation therapy -medicines that lower your immune system like etanercept, anakinra, infliximab, and adalimumab -medicines that treat or prevent blood clots like warfarin -phenytoin -steroid medicines like prednisone or cortisone -theophylline -vaccines This list may not describe all possible interactions. Give your health care provider a list of all the medicines, herbs, non-prescription drugs, or dietary supplements you use. Also tell them if you smoke, drink alcohol, or use illegal drugs. Some items may interact with your medicine. What should I watch for while using this medicine? Report any side effects that do not go away within 3 days to your doctor or health care professional. Call your health care provider if any unusual symptoms occur within 6 weeks of receiving this vaccine. You may still catch the flu, but the illness is not usually as bad. You cannot get the flu from the vaccine. The vaccine will not protect against colds or other illnesses that may cause fever. The vaccine is needed every year. What side effects may I notice from receiving  this medicine? Side effects that you should report to your doctor or health care professional as soon as possible: -allergic reactions like skin rash, itching or hives, swelling of the face, lips, or tongue Side effects that usually do not require medical attention (report to your doctor or health care professional if they continue or are  bothersome): -fever -headache -muscle aches and pains -pain, tenderness, redness, or swelling at site where injected -weak or tired This list may not describe all possible side effects. Call your doctor for medical advice about side effects. You may report side effects to FDA at 1-800-FDA-1088. Where should I keep my medicine? This vaccine is only given in a clinic, pharmacy, doctor's office, or other health care setting and will not be stored at home. NOTE: This sheet is a summary. It may not cover all possible information. If you have questions about this medicine, talk to your doctor, pharmacist, or health care provider.  2017 Elsevier/Gold Standard (2008-05-24 09:30:40)

## 2016-10-06 NOTE — Progress Notes (Signed)
Andrea Clements, is a 50 y.o. female  KMQ:[id number]  HAF:[id number]  DOB - 06-Sep-1966  Chief Complaint  Patient presents with  . Hypertension        Subjective:   Andrea Clements is a 50 y.o. female here today for a follow up visit, last seen 03/17/16, for pre-dm/htn/ckd4/morbid obesity, has since been seeing renal. Sp lue AVF placement 9/17 which is healing nicely.  Pt was rushing w/ kids this am and to appt, so forgot to take her am bp meds.    Still smoking 1/2 ppd, knows she should stop but having difficult time.  C/o of easing bruising lately, on plavix, not aspirin.   Patient has No headache, No chest pain, No abdominal pain - No Nausea, No new weakness tingling or numbness, No Cough - SOB.  No problems updated.  ALLERGIES: Allergies  Allergen Reactions  . Altace [Ramipril] Anaphylaxis  . Aspirin Anaphylaxis    PAST MEDICAL HISTORY: Past Medical History:  Diagnosis Date  . Anxiety   . Arthritis   . Asthma   . Cardiomyopathy (HCC)    a. 09/2014 Echo: EF 30-35%, inflat, inf, infsept AK, Gr 2 DD-->No ischemic w/u 2/2 recent CVA and body habitus.  . CKD (chronic kidney disease), stage III   . CVA (cerebral infarction)    a. Acute sub cm LEFT basal ganglia/internal capsule lacunar infarct, normal MRA.  . Depression   . GERD (gastroesophageal reflux disease)   . Hypertension   . Morbid obesity (HCC)   . Pneumonia    history  . Stroke (HCC)   . Tobacco abuse     MEDICATIONS AT HOME: Prior to Admission medications   Medication Sig Start Date End Date Taking? Authorizing Provider  albuterol (PROVENTIL HFA;VENTOLIN HFA) 108 (90 BASE) MCG/ACT inhaler Inhale 2 puffs into the lungs every 6 (six) hours as needed for wheezing or shortness of breath. 10/10/14   Doris Cheadle, MD  amLODipine (NORVASC) 10 MG tablet TAKE ONE TABLET BY MOUTH ONCE DAILY 06/20/16   Tonny Bollman, MD  atorvastatin (LIPITOR) 40 MG tablet Take 1 tablet (40 mg total) by mouth daily. 03/18/16    Tonny Bollman, MD  chlorthalidone (HYGROTON) 25 MG tablet Take 1 tablet (25 mg total) by mouth daily. 03/18/16   Tonny Bollman, MD  clopidogrel (PLAVIX) 75 MG tablet Take 1 tablet (75 mg total) by mouth daily with breakfast. 10/31/15   Beatrice Lecher, PA-C  hydrALAZINE (APRESOLINE) 25 MG tablet Take 25 mg by mouth daily.    Historical Provider, MD  isosorbide mononitrate (IMDUR) 30 MG 24 hr tablet Take 1 tablet (30 mg total) by mouth daily. 10/31/15   Beatrice Lecher, PA-C  metoprolol succinate (TOPROL-XL) 100 MG 24 hr tablet Take 1 tablet (100 mg total) by mouth daily. Take with or immediately following a meal. 10/31/15   Beatrice Lecher, PA-C  pantoprazole (PROTONIX) 40 MG tablet Take 40 mg by mouth daily.    Historical Provider, MD  potassium chloride SA (K-DUR,KLOR-CON) 20 MEQ tablet Take 20 mEq by mouth 2 (two) times daily.    Historical Provider, MD  spironolactone (ALDACTONE) 25 MG tablet Take 1 tablet (25 mg total) by mouth daily. 07/30/16   Tonny Bollman, MD     Objective:   Vitals:   10/06/16 1116  BP: (!) 157/93  Pulse: 77  Resp: 16  Temp: 98.3 F (36.8 C)  TempSrc: Oral  SpO2: 100%  Weight: 276 lb 9.6 oz (125.5 kg)    Exam  General appearance : Awake, alert, not in any distress. Speech Clear. Not toxic looking, morbid obese.  HEENT: Atraumatic and Normocephalic, pupils equally reactive to light. Neck: supple, no JVD.  Chest:Good air entry bilaterally, no added sounds. CVS: S1 S2 regular, no murmurs/gallups or rubs. Abdomen: Bowel sounds active, Non tender and not distended with no gaurding, rigidity or rebound. Extremities: B/L Lower Ext shows no edema, both legs are warm to touch, LUE fistula healing. Neurology: Awake alert, and oriented X 3, CN II-XII grossly intact, Non focal Skin. Numerous echymosis noted, on lue and rle leg, about 1inch diameter, in various stages of healing.  Data Review Lab Results  Component Value Date   HGBA1C 6.4 03/17/2016   HGBA1C 5.9 (H)  10/03/2014    Depression screen PHQ 2/9 10/06/2016 03/17/2016 10/10/2014  Decreased Interest 0 0 0  Down, Depressed, Hopeless 0 0 -  PHQ - 2 Score 0 0 0      Assessment & Plan   1. 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 - on norvasc 10, chlorthalidone 25 qd, hydralazine 25 qd, imdur 20 qd - defer to cards and renal. - low salt diet stressed.  2. Chronic diastolic CHF (congestive heart failure) (HCC) Euvolemic. - on spironolacton 25 qd and chlorthalidone 25 qd. - defer to cards  3. Tobacco abuse Complete cessation recd, tips discussed and provided.  4. Morbid obesity (HCC) Recd low carb diet and increase exercise for weight loss., info on lc/hf/if diet provided.  5. Prediabetes a1c 6.4 today. Started amaryl 1mg  qd. Low carb diet discussed  6. Encounter for immunization - tdap today - Flu Vaccine QUAD 36+ mos IM  7. Pt had prior hysterectomy, per pt had exam about 2 years ago and was told all normal. Does not recall if needs papsmear further   Patient have been counseled extensively about nutrition and exercise  Return in about 3 months (around 01/06/2017) for htn / predm.  The patient was given clear instructions to go to ER or return to medical center if symptoms don't improve, worsen or new problems develop. The patient verbalized understanding. The patient was told to call to get lab results if they haven't heard anything in the next week.   This note has been created with Education officer, environmentalDragon speech recognition software and smart phrase technology. Any transcriptional errors are unintentional.   Pete Glatterawn T Langeland, MD, MBA/MHA [facility] [place],  [place] [phone]   10/06/2016, 2:34 PM

## 2016-10-07 ENCOUNTER — Ambulatory Visit: Payer: Medicaid Other | Attending: Internal Medicine

## 2016-10-07 DIAGNOSIS — I130 Hypertensive heart and chronic kidney disease with heart failure and stage 1 through stage 4 chronic kidney disease, or unspecified chronic kidney disease: Secondary | ICD-10-CM | POA: Diagnosis not present

## 2016-10-07 DIAGNOSIS — E559 Vitamin D deficiency, unspecified: Secondary | ICD-10-CM | POA: Insufficient documentation

## 2016-10-07 LAB — BASIC METABOLIC PANEL WITH GFR
BUN: 29 mg/dL — AB (ref 7–25)
CHLORIDE: 103 mmol/L (ref 98–110)
CO2: 28 mmol/L (ref 20–31)
CREATININE: 2.62 mg/dL — AB (ref 0.50–1.10)
Calcium: 9.8 mg/dL (ref 8.6–10.2)
GFR, Est African American: 24 mL/min — ABNORMAL LOW (ref >=60)
GFR, Est Non African American: 21 mL/min — ABNORMAL LOW (ref >=60)
GLUCOSE: 99 mg/dL (ref 65–99)
POTASSIUM: 4.1 mmol/L (ref 3.5–5.3)
Sodium: 141 mmol/L (ref 135–146)

## 2016-10-07 LAB — CBC WITH DIFFERENTIAL/PLATELET
BASOS ABS: 0 {cells}/uL (ref 0–200)
Basophils Relative: 0 %
EOS ABS: 315 {cells}/uL (ref 15–500)
Eosinophils Relative: 5 %
HEMATOCRIT: 38.9 % (ref 35.0–45.0)
HEMOGLOBIN: 12.8 g/dL (ref 11.7–15.5)
LYMPHS ABS: 1890 {cells}/uL (ref 850–3900)
LYMPHS PCT: 30 %
MCH: 29.7 pg (ref 27.0–33.0)
MCHC: 32.9 g/dL (ref 32.0–36.0)
MCV: 90.3 fL (ref 80.0–100.0)
MONO ABS: 504 {cells}/uL (ref 200–950)
MPV: 10.6 fL (ref 7.5–12.5)
Monocytes Relative: 8 %
NEUTROS PCT: 57 %
Neutro Abs: 3591 cells/uL (ref 1500–7800)
Platelets: 276 10*3/uL (ref 140–400)
RBC: 4.31 MIL/uL (ref 3.80–5.10)
RDW: 14.5 % (ref 11.0–15.0)
WBC: 6.3 10*3/uL (ref 3.8–10.8)

## 2016-10-07 NOTE — Progress Notes (Signed)
Patient here for lab visit only 

## 2016-10-08 LAB — APTT: aPTT: 32 s (ref 22–34)

## 2016-10-08 LAB — PROTIME-INR
INR: 1.1
Prothrombin Time: 11.2 s (ref 9.0–11.5)

## 2016-10-08 LAB — VITAMIN D 25 HYDROXY (VIT D DEFICIENCY, FRACTURES): Vit D, 25-Hydroxy: 16 ng/mL — ABNORMAL LOW (ref 30–100)

## 2016-10-09 ENCOUNTER — Emergency Department (HOSPITAL_COMMUNITY)
Admission: EM | Admit: 2016-10-09 | Discharge: 2016-10-09 | Disposition: A | Discharge status: Home / Self Care | Payer: Medicaid Other | Attending: Emergency Medicine | Admitting: Emergency Medicine

## 2016-10-09 ENCOUNTER — Emergency Department (HOSPITAL_COMMUNITY): Payer: Medicaid Other

## 2016-10-09 ENCOUNTER — Encounter (HOSPITAL_COMMUNITY): Payer: Self-pay | Admitting: *Deleted

## 2016-10-09 DIAGNOSIS — Y929 Unspecified place or not applicable: Secondary | ICD-10-CM | POA: Insufficient documentation

## 2016-10-09 DIAGNOSIS — Z7984 Long term (current) use of oral hypoglycemic drugs: Secondary | ICD-10-CM | POA: Diagnosis not present

## 2016-10-09 DIAGNOSIS — S60032A Contusion of left middle finger without damage to nail, initial encounter: Secondary | ICD-10-CM | POA: Diagnosis not present

## 2016-10-09 DIAGNOSIS — F17210 Nicotine dependence, cigarettes, uncomplicated: Secondary | ICD-10-CM | POA: Insufficient documentation

## 2016-10-09 DIAGNOSIS — I130 Hypertensive heart and chronic kidney disease with heart failure and stage 1 through stage 4 chronic kidney disease, or unspecified chronic kidney disease: Secondary | ICD-10-CM | POA: Diagnosis not present

## 2016-10-09 DIAGNOSIS — Y939 Activity, unspecified: Secondary | ICD-10-CM | POA: Diagnosis not present

## 2016-10-09 DIAGNOSIS — Z8673 Personal history of transient ischemic attack (TIA), and cerebral infarction without residual deficits: Secondary | ICD-10-CM | POA: Diagnosis not present

## 2016-10-09 DIAGNOSIS — Y999 Unspecified external cause status: Secondary | ICD-10-CM | POA: Insufficient documentation

## 2016-10-09 DIAGNOSIS — I5032 Chronic diastolic (congestive) heart failure: Secondary | ICD-10-CM | POA: Insufficient documentation

## 2016-10-09 DIAGNOSIS — M79632 Pain in left forearm: Secondary | ICD-10-CM

## 2016-10-09 DIAGNOSIS — N184 Chronic kidney disease, stage 4 (severe): Secondary | ICD-10-CM | POA: Insufficient documentation

## 2016-10-09 DIAGNOSIS — J45909 Unspecified asthma, uncomplicated: Secondary | ICD-10-CM | POA: Insufficient documentation

## 2016-10-09 DIAGNOSIS — S6992XA Unspecified injury of left wrist, hand and finger(s), initial encounter: Secondary | ICD-10-CM | POA: Diagnosis present

## 2016-10-09 DIAGNOSIS — M79645 Pain in left finger(s): Secondary | ICD-10-CM

## 2016-10-09 MED ORDER — HYDROCODONE-ACETAMINOPHEN 5-325 MG PO TABS: 1.0000 | ORAL_TABLET | ORAL | 0 refills | Status: DC | PRN

## 2016-10-09 MED ORDER — ACETAMINOPHEN 325 MG PO TABS
650.0000 mg | ORAL_TABLET | Freq: Once | ORAL | Status: AC
  Administered 2016-10-09: 650 mg via ORAL
  Filled 2016-10-09: qty 2
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
## 2016-10-09 NOTE — ED Notes (Signed)
AP at bedside
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
## 2016-10-13 ENCOUNTER — Other Ambulatory Visit: Payer: Self-pay | Admitting: Internal Medicine

## 2016-10-13 MED ORDER — VITAMIN D (ERGOCALCIFEROL) 1.25 MG (50000 UNIT) PO CAPS: 50000.0000 [IU] | ORAL_CAPSULE | ORAL | 0 refills | Status: AC

## 2016-10-16 ENCOUNTER — Telehealth: Payer: Self-pay

## 2016-10-16 NOTE — Telephone Encounter (Signed)
Contacted pt to go over lab results pt didn't answer lvm asking pt to give me a call back at her earliest convenience  

## 2016-10-21 ENCOUNTER — Encounter: Payer: Self-pay | Admitting: Internal Medicine

## 2016-10-21 NOTE — Progress Notes (Signed)
Seen by Renal, 10/16/16, [facility], Dr Camille Bal - stage 4 ckd, on spironolactone, k, and chlorathalidone. - hld, atorvastatin 40 qhs - fu in 3 months  Will scan notes.

## 2016-11-04 ENCOUNTER — Other Ambulatory Visit: Payer: Self-pay | Admitting: Cardiovascular Disease

## 2016-11-04 DIAGNOSIS — I10 Essential (primary) hypertension: Secondary | ICD-10-CM

## 2016-11-04 DIAGNOSIS — I429 Cardiomyopathy, unspecified: Secondary | ICD-10-CM

## 2016-11-05 ENCOUNTER — Other Ambulatory Visit: Payer: Self-pay | Admitting: Physician Assistant

## 2016-11-05 DIAGNOSIS — I6389 Other cerebral infarction: Secondary | ICD-10-CM

## 2016-11-05 MED FILL — CALCITRIOL 0.25 MCG CAPSULE: 0.25 | 30 days supply | Qty: 16 | Fill #1

## 2016-11-05 MED FILL — ATORVASTATIN 40 MG TABLET: 40 | 30 days supply | Qty: 30 | Fill #6

## 2016-11-05 MED FILL — CLOPIDOGREL 75 MG TABLET: 75 | 30 days supply | Qty: 30 | Fill #0 | Status: TO

## 2016-11-07 MED FILL — ?CHLORTHALIDONE 25 MG TABLE: 25 | 30 days supply | Qty: 30 | Fill #6

## 2016-11-14 ENCOUNTER — Ambulatory Visit: Payer: Medicaid Other | Attending: Internal Medicine | Admitting: Physician Assistant

## 2016-11-14 ENCOUNTER — Other Ambulatory Visit: Payer: Self-pay | Admitting: Cardiovascular Disease

## 2016-11-14 ENCOUNTER — Encounter: Payer: Self-pay | Admitting: Physician Assistant

## 2016-11-14 ENCOUNTER — Other Ambulatory Visit: Payer: Self-pay | Admitting: *Deleted

## 2016-11-14 VITALS — BP 160/100 | HR 76 | Temp 98.0°F | Resp 16 | Wt 218.4 lb

## 2016-11-14 DIAGNOSIS — F329 Major depressive disorder, single episode, unspecified: Secondary | ICD-10-CM | POA: Diagnosis not present

## 2016-11-14 DIAGNOSIS — Z1231 Encounter for screening mammogram for malignant neoplasm of breast: Secondary | ICD-10-CM | POA: Diagnosis not present

## 2016-11-14 DIAGNOSIS — N183 Chronic kidney disease, stage 3 (moderate): Secondary | ICD-10-CM | POA: Insufficient documentation

## 2016-11-14 DIAGNOSIS — I129 Hypertensive chronic kidney disease with stage 1 through stage 4 chronic kidney disease, or unspecified chronic kidney disease: Secondary | ICD-10-CM | POA: Insufficient documentation

## 2016-11-14 DIAGNOSIS — I10 Essential (primary) hypertension: Secondary | ICD-10-CM

## 2016-11-14 DIAGNOSIS — J45909 Unspecified asthma, uncomplicated: Secondary | ICD-10-CM | POA: Diagnosis not present

## 2016-11-14 DIAGNOSIS — N644 Mastodynia: Secondary | ICD-10-CM | POA: Insufficient documentation

## 2016-11-14 DIAGNOSIS — F419 Anxiety disorder, unspecified: Secondary | ICD-10-CM | POA: Diagnosis not present

## 2016-11-14 DIAGNOSIS — F17200 Nicotine dependence, unspecified, uncomplicated: Secondary | ICD-10-CM | POA: Insufficient documentation

## 2016-11-14 DIAGNOSIS — Z8673 Personal history of transient ischemic attack (TIA), and cerebral infarction without residual deficits: Secondary | ICD-10-CM | POA: Insufficient documentation

## 2016-11-14 DIAGNOSIS — K219 Gastro-esophageal reflux disease without esophagitis: Secondary | ICD-10-CM | POA: Insufficient documentation

## 2016-11-14 DIAGNOSIS — I429 Cardiomyopathy, unspecified: Secondary | ICD-10-CM | POA: Insufficient documentation

## 2016-11-14 DIAGNOSIS — Z1239 Encounter for other screening for malignant neoplasm of breast: Secondary | ICD-10-CM

## 2016-11-14 NOTE — Progress Notes (Signed)
Lonetta Blassingame, is a 51 y.o. female  ZOX:[id number]  WJX:[id number]  DOB - 1966-02-20  Subjective:  Chief Complaint and HPI: Kayte Borchard is a 51 y.o. female here today for generalized breast tenderness for over 1 month.  She denies any new mass or lump.  She hasn't had a MMG in a couple of years.  No nipple drainage.  She says her BP is elevated bc she ate salty meat last night.  She was out of chlorthalidone over the holidays and just restarted her prescription of it a day or 2 ago.  Otherwise, she is compliant with her BP meds.   ROS:   Constitutional:  No f/c, No night sweats, No unexplained weight loss. EENT:  No vision changes, No blurry vision, No hearing changes. No mouth, throat, or ear problems.  Respiratory: No cough, No SOB Cardiac: No CP, no palpitations GI:  No abd pain, No N/V/D. GU: No Urinary s/sx Musculoskeletal: No joint pain Neuro: No headache, no dizziness, no motor weakness.  Skin: No rash Endocrine:  No polydipsia. No polyuria.  Psych: Denies SI/HI  No problems updated.  ALLERGIES: Allergies  Allergen Reactions  . Altace [Ramipril] Anaphylaxis  . Aspirin Anaphylaxis    PAST MEDICAL HISTORY: Past Medical History:  Diagnosis Date  . Anxiety   . Arthritis   . Asthma   . Cardiomyopathy (HCC)    a. 09/2014 Echo: EF 30-35%, inflat, inf, infsept AK, Gr 2 DD-->No ischemic w/u 2/2 recent CVA and body habitus.  . CKD (chronic kidney disease), stage III   . CVA (cerebral infarction)    a. Acute sub cm LEFT basal ganglia/internal capsule lacunar infarct, normal MRA.  . Depression   . GERD (gastroesophageal reflux disease)   . Hypertension   . Morbid obesity (HCC)   . Pneumonia    history  . Stroke (HCC)   . Tobacco abuse     MEDICATIONS AT HOME: Prior to Admission medications   Medication Sig Start Date End Date Taking? Authorizing Provider  albuterol (PROVENTIL HFA;VENTOLIN HFA) 108 (90 BASE) MCG/ACT inhaler Inhale 2 puffs into the lungs  every 6 (six) hours as needed for wheezing or shortness of breath. 10/10/14   Doris Cheadle, MD  amLODipine (NORVASC) 10 MG tablet TAKE ONE TABLET BY MOUTH ONCE DAILY 11/04/16   Tonny Bollman, MD  atorvastatin (LIPITOR) 40 MG tablet Take 1 tablet (40 mg total) by mouth daily. 03/18/16   Tonny Bollman, MD  chlorthalidone (HYGROTON) 25 MG tablet Take 1 tablet (25 mg total) by mouth daily. 03/18/16   Tonny Bollman, MD  clopidogrel (PLAVIX) 75 MG tablet TAKE 1 TABLET BY MOUTH ONCE DAILY WITH BREAKFAST. 11/05/16   Beatrice Lecher, PA-C  glimepiride (AMARYL) 1 MG tablet Take 1 tablet (1 mg total) by mouth daily with breakfast. 10/06/16   Pete Glatter, MD  hydrALAZINE (APRESOLINE) 25 MG tablet Take 25 mg by mouth daily.    Historical Provider, MD  HYDROcodone-acetaminophen (NORCO/VICODIN) 5-325 MG tablet Take 1 tablet by mouth every 4 (four) hours as needed for severe pain. Patient not taking: Reported on 11/14/2016 10/09/16   Ace Gins Sam, PA-C  isosorbide mononitrate (IMDUR) 30 MG 24 hr tablet Take 1 tablet (30 mg total) by mouth daily. 10/31/15   Beatrice Lecher, PA-C  metoprolol succinate (TOPROL-XL) 100 MG 24 hr tablet Take 1 tablet (100 mg total) by mouth daily. Take with or immediately following a meal. 10/31/15   Beatrice Lecher, PA-C  pantoprazole (PROTONIX) 40  MG tablet Take 40 mg by mouth daily.    Historical Provider, MD  potassium chloride SA (K-DUR,KLOR-CON) 20 MEQ tablet Take 20 mEq by mouth 2 (two) times daily.    Historical Provider, MD  spironolactone (ALDACTONE) 25 MG tablet Take 1 tablet (25 mg total) by mouth daily. 07/30/16   Tonny Bollman, MD  Vitamin D, Ergocalciferol, (DRISDOL) 50000 units CAPS capsule Take 1 capsule (50,000 Units total) by mouth every 7 (seven) days. Patient not taking: Reported on 11/14/2016 10/13/16   Pete Glatter, MD     Objective:  EXAM:   Vitals:   11/14/16 0912  BP: (!) 168/112  Pulse: 76  Resp: 16  Temp: 98 F (36.7 C)  TempSrc: Oral  SpO2: 96%    Weight: 218 lb 6.4 oz (99.1 kg)    General appearance : A&OX3. NAD. Non-toxic-appearing HEENT: Atraumatic and Normocephalic.  PERRLA. EOM intact.  TM clear B. Mouth-MMM, post pharynx WNL w/o erythema, No PND. Neck: supple, no JVD. No cervical lymphadenopathy. No thyromegaly Chest/Lungs:  Breathing-non-labored, Good air entry bilaterally with some minimal wheezing  CVS: S1 S2 regular, no murmurs, gallops, rubs  Extremities: Bilateral Lower Ext shows minimal edema, both legs are warm to touch with = pulse throughout Neurology:  CN II-XII grossly intact, Non focal.   Psych:  TP linear. J/I WNL. Normal speech. Appropriate eye contact and affect.  Skin:  No Rash  Data Review Lab Results  Component Value Date   HGBA1C 6.3 10/06/2016   HGBA1C 6.4 03/17/2016   HGBA1C 5.9 (H) 10/03/2014     Assessment & Plan   1. Breast tenderness in female overdue for Breast cancer screening - MM Digital Screening; Future  2. Essential hypertension Uncontrolled but ran out of chlorthalidone over the holidays and just restarted it a day or 2 ago. She has a BP cuff at home-Check BP daily and record and bring recordings with you to your next visit. Continue medication regimen, DASH diet, stop smoking  3. Smoking Smoking cessation advised  Patient have been counseled extensively about nutrition and exercise  Return in about 3 weeks (around 12/05/2016) for f/up Dr Julien Nordmann for uncontrolled htn.  The patient was given clear instructions to go to ER or return to medical center if symptoms don't improve, worsen or new problems develop. The patient verbalized understanding. The patient was told to call to get lab results if they haven't heard anything in the next week.     Georgian Co, PA-C [facility] [place], [place] [phone]   11/14/2016, 9:23 AMPatient ID: [name], female   DOB: 08-13-1966, 51 y.o.   MRN: [id number]

## 2016-11-14 NOTE — Patient Instructions (Signed)
Check BP daily and record and bring recordings with you to your next visit.

## 2016-11-17 NOTE — Telephone Encounter (Signed)
Okay to refill? Please advise. Thanks, MI 

## 2016-11-20 ENCOUNTER — Telehealth: Payer: Self-pay | Admitting: Physician Assistant

## 2016-11-20 NOTE — Telephone Encounter (Signed)
Spoke with Santina Evans at [employer] regarding patient. Santina Evans states that the patient needs an extensive deep cleaning. She states that the patient had a stroke in 2015 and is taking Plavix. She states that she needs recommendations in regards to the patient holding the Plavix in order to form a treatment plan. Santina Evans states that she has faxed over forms for medical clearance 3 times now (one in October, one on 11/12/16, and again today). She would like [place] to give her a call at [phone] to let her know if we have received the fax and with recommendations regarding holding the patient's Plavix. Please advise.

## 2016-11-20 NOTE — Telephone Encounter (Signed)
From a cardiac perspective, she can hold Plavix x 5 days prior to her procedure and resume after when ok. BUT >>> Her Plavix was started in the setting of a stroke in 2015.  I would defer to her Neurologist or PCP for final recommendations regarding whether or not Plavix can be interrupted for dental work (and how long).  Please ask dentist to d/w Neurologist or PCP (if not seeing Neuro) for recommendations regarding Plavix. Tereso Newcomer, PA-C   11/20/2016 1:06 PM

## 2016-11-20 NOTE — Telephone Encounter (Signed)
Santina Evans from [employer] is calling on behalf of patient (Andrea Clements.) Ms. Durnan is currently taking Plavix, so they need to find out which route to take. Santina Evans states that she has faxed over a form for medical clearance and has not received anything back, she will fax it over again today. If you have any questions you can call [phone] and the fax number is [phone]. Thanks.

## 2016-11-20 NOTE — Telephone Encounter (Signed)
I s/w [facility] to advised per Tereso Newcomer, PA to defer to either Neurology or PCP about Plavix for her upcoming dental work.Plavix was started in the setting of stroke 2015, see Neurology consult note on 10/02/14 Dr. Noel Christmas. I also gave dental office name of PCP. Dr. Dierdre Searles. Dental office was grateful the help and they will contact the 2 other providers about Plavix .

## 2016-11-25 MED FILL — METOPROLOL SUCC ER 100 MG T: 100 | 30 days supply | Qty: 30 | Fill #1

## 2016-11-25 MED FILL — ISOSORBIDE MN ER 30 MG TAB: 30 | 30 days supply | Qty: 30 | Fill #1

## 2016-12-02 ENCOUNTER — Encounter: Payer: Self-pay | Admitting: Internal Medicine

## 2016-12-02 ENCOUNTER — Encounter: Payer: Self-pay | Admitting: Surgery

## 2016-12-02 NOTE — Progress Notes (Signed)
Pt requesting renewal of norvasc 5. bp ooc on 11/14/16 in clinic w/ PA. Will ask pt make appt w/ Eustace Pen RN for bp check soon. She should have picked up her chlorathalidone by now - she ran out of it on most recent appt.  If sbp <130, than renew norvasc 5 qd If sbp >130, than increase Norvasc to 10 qd.

## 2016-12-05 ENCOUNTER — Ambulatory Visit: Payer: Medicaid Other | Attending: Internal Medicine | Admitting: *Deleted

## 2016-12-05 VITALS — BP 168/86 | HR 65 | Resp 20

## 2016-12-05 DIAGNOSIS — I10 Essential (primary) hypertension: Secondary | ICD-10-CM | POA: Diagnosis present

## 2016-12-05 NOTE — Progress Notes (Signed)
Pt here for f/u BP check .Pt denies chest pain, SOB, HA, new vison concerns, or generalized swelling.   She has not  been taking medications as prescribed. States she has been out of Amlodipine for 1 month. Prescription was called to pharmacy prior to apt by Laruth Bouchard, CMA. Pt aware. Blood pressure taken manually while patient is sitting. Nurse visit will be routed to provider.Guy Franco, RN, BSN

## 2016-12-08 ENCOUNTER — Encounter: Payer: Self-pay | Admitting: Surgery

## 2016-12-08 ENCOUNTER — Ambulatory Visit (INDEPENDENT_AMBULATORY_CARE_PROVIDER_SITE_OTHER): Payer: Medicaid Other | Admitting: Surgery

## 2016-12-08 VITALS — BP 162/86 | HR 75 | Temp 97.8°F | Resp 18 | Ht 61.0 in | Wt 281.0 lb

## 2016-12-08 DIAGNOSIS — N185 Chronic kidney disease, stage 5: Secondary | ICD-10-CM

## 2016-12-08 NOTE — Progress Notes (Signed)
[facility]ist [facility]  Patient name: Andrea Clements MRN: [id number] DOB: 1966-08-09 Sex: female   REASON FOR VISIT:    Follow up renal access  HISOTRY OF PRESENT ILLNESS:    Andrea Clements is a 51 y.o. female returns today for follow-up.  She is status post left radiocephalic fistula creation on 07/23/2016.  She is not yet on dialysis.  She has no complaints in her left hand.   PAST MEDICAL HISTORY:   Past Medical History:  Diagnosis Date  . Anxiety   . Arthritis   . Asthma   . Cardiomyopathy (HCC)    a. 09/2014 Echo: EF 30-35%, inflat, inf, infsept AK, Gr 2 DD-->No ischemic w/u 2/2 recent CVA and body habitus.  . CKD (chronic kidney disease), stage III   . CVA (cerebral infarction)    a. Acute sub cm LEFT basal ganglia/internal capsule lacunar infarct, normal MRA.  . Depression   . GERD (gastroesophageal reflux disease)   . Hypertension   . Morbid obesity (HCC)   . Pneumonia    history  . Stroke (HCC)   . Tobacco abuse      FAMILY HISTORY:   Family History  Problem Relation Age of Onset  . Hypertension Mother   . Cancer Mother   . Hypertension Father   . Hypertension Maternal Grandfather   . Hypertension Paternal Grandmother   . Hypertension Paternal Grandfather   . Hypertension Maternal Grandmother     SOCIAL HISTORY:   Social History  Substance Use Topics  . Smoking status: Current Every Day Smoker    Packs/day: 1.00    Years: 30.00    Types: Cigarettes  . Smokeless tobacco: Never Used  . Alcohol use No     ALLERGIES:   Allergies  Allergen Reactions  . Altace [Ramipril] Anaphylaxis  . Aspirin Anaphylaxis     CURRENT MEDICATIONS:   Current Outpatient Prescriptions  Medication Sig Dispense Refill  . albuterol (PROVENTIL HFA;VENTOLIN HFA) 108 (90 BASE) MCG/ACT inhaler Inhale 2 puffs into the lungs every 6 (six) hours as needed for wheezing or shortness of breath. 1 Inhaler 1  .  amLODipine (NORVASC) 10 MG tablet TAKE ONE TABLET BY MOUTH ONCE DAILY 90 tablet 3  . atorvastatin (LIPITOR) 40 MG tablet Take 1 tablet (40 mg total) by mouth daily. 30 tablet 11  . chlorthalidone (HYGROTON) 25 MG tablet Take 1 tablet (25 mg total) by mouth daily. 30 tablet 11  . clopidogrel (PLAVIX) 75 MG tablet TAKE 1 TABLET BY MOUTH ONCE DAILY WITH BREAKFAST. 90 tablet 3  . glimepiride (AMARYL) 1 MG tablet Take 1 tablet (1 mg total) by mouth daily with breakfast. 90 tablet 3  . hydrALAZINE (APRESOLINE) 25 MG tablet Take 25 mg by mouth daily.    . isosorbide mononitrate (IMDUR) 30 MG 24 hr tablet Take 1 tablet (30 mg total) by mouth daily. 90 tablet 3  . metoprolol succinate (TOPROL-XL) 100 MG 24 hr tablet Take 1 tablet (100 mg total) by mouth daily. Take with or immediately following a meal. 90 tablet 3  . pantoprazole (PROTONIX) 40 MG tablet TAKE ONE TABLET BY MOUTH ONCE DAILY 90 tablet 3  . potassium chloride SA (K-DUR,KLOR-CON) 20 MEQ tablet Take 20 mEq by mouth 2 (two) times daily.    Marland Kitchen spironolactone (ALDACTONE) 25 MG tablet Take 1 tablet (25 mg total) by mouth daily. 90 tablet 3  . Vitamin D, Ergocalciferol, (DRISDOL) 50000 units CAPS capsule Take 1 capsule (50,000 Units  total) by mouth every 7 (seven) days. 12 capsule 0  . HYDROcodone-acetaminophen (NORCO/VICODIN) 5-325 MG tablet Take 1 tablet by mouth every 4 (four) hours as needed for severe pain. (Patient not taking: Reported on 12/08/2016) 10 tablet 0   No current facility-administered medications for this visit.     REVIEW OF SYSTEMS:   [X]  denotes positive finding, [ ]  denotes negative finding Cardiac  Comments:  Chest pain or chest pressure:    Shortness of breath upon exertion:    Short of breath when lying flat:    Irregular heart rhythm:        Vascular    Pain in calf, thigh, or hip brought on by ambulation:    Pain in feet at night that wakes you up from your sleep:     Blood clot in your veins:    Leg swelling:           Pulmonary    Oxygen at home:    Productive cough:     Wheezing:         Neurologic    Sudden weakness in arms or legs:     Sudden numbness in arms or legs:     Sudden onset of difficulty speaking or slurred speech:    Temporary loss of vision in one eye:     Problems with dizziness:         Gastrointestinal    Blood in stool:     Vomited blood:         Genitourinary    Burning when urinating:     Blood in urine:        Psychiatric    Major depression:         Hematologic    Bleeding problems:    Problems with blood clotting too easily:        Skin    Rashes or ulcers:        Constitutional    Fever or chills:      PHYSICAL EXAM:   Vitals:   12/08/16 1146 12/08/16 1148  BP: (!) 162/83 (!) 162/86  Pulse: 75   Resp: 18   Temp: 97.8 F (36.6 C)   TempSrc: Oral   SpO2: 98%   Weight: 281 lb (127.5 kg)   Height: 5\' 1"  (1.549 m)     GENERAL: The patient is a well-nourished female, in no acute distress. The vital signs are documented above. CARDIAC: There is a regular rate and rhythm.  VASCULAR: Much more prominent thrill within the left radiocephalic fistula today PULMONARY: Non-labored respirations  MUSCULOSKELETAL: There are no major deformities or cyanosis. NEUROLOGIC: No focal weakness or paresthesias are detected. SKIN: There are no ulcers or rashes noted. PSYCHIATRIC: The patient has a normal affect.  STUDIES:   I evaluated the vein with ultrasound.  It appears to be less than 0.5 cm throughout.  MEDICAL ISSUES:   The access is much more prominent today with an excellent thrill.  I feel that it is ready for use, should she need to start renal replacement therapy.  She will follow-up with me on an as-needed basis.    Durene Cal, MD [facility] [phone] Pager [phone]

## 2016-12-22 MED FILL — CALCITRIOL 0.25 MCG CAPSULE: 0.25 | 30 days supply | Qty: 16 | Fill #2

## 2016-12-22 MED FILL — CLOPIDOGREL 75 MG TABLET: 75 | 30 days supply | Qty: 30 | Fill #1 | Status: TO

## 2016-12-22 MED FILL — ATORVASTATIN 40 MG TABLET: 40 | 30 days supply | Qty: 30 | Fill #7

## 2016-12-22 MED FILL — ISOSORBIDE MN ER 30 MG TAB: 30 | 30 days supply | Qty: 30 | Fill #2

## 2016-12-22 MED FILL — CHLORTHALIDONE 25 MG TABLET: 25 | 30 days supply | Qty: 30 | Fill #7

## 2016-12-22 MED FILL — METOPROLOL SUCC ER 100 MG T: 100 | 30 days supply | Qty: 30 | Fill #2

## 2016-12-31 ENCOUNTER — Ambulatory Visit
Admission: RE | Admit: 2016-12-31 | Discharge: 2016-12-31 | Disposition: A | Discharge status: Home / Self Care | Payer: Medicaid Other | Source: Ambulatory Visit | Attending: Physician Assistant | Admitting: Physician Assistant

## 2016-12-31 DIAGNOSIS — Z1239 Encounter for other screening for malignant neoplasm of breast: Secondary | ICD-10-CM

## 2017-01-09 ENCOUNTER — Ambulatory Visit (HOSPITAL_COMMUNITY)
Admission: RE | Admit: 2017-01-09 | Discharge: 2017-01-09 | Disposition: A | Discharge status: Home / Self Care | Payer: Medicaid Other | Source: Ambulatory Visit | Attending: Physician Assistant | Admitting: Physician Assistant

## 2017-01-09 ENCOUNTER — Ambulatory Visit: Payer: Medicaid Other | Attending: Internal Medicine | Admitting: Physician Assistant

## 2017-01-09 VITALS — BP 139/84 | HR 69 | Temp 98.0°F | Resp 16 | Wt 280.8 lb

## 2017-01-09 DIAGNOSIS — K219 Gastro-esophageal reflux disease without esophagitis: Secondary | ICD-10-CM | POA: Diagnosis not present

## 2017-01-09 DIAGNOSIS — F329 Major depressive disorder, single episode, unspecified: Secondary | ICD-10-CM | POA: Diagnosis not present

## 2017-01-09 DIAGNOSIS — Z79899 Other long term (current) drug therapy: Secondary | ICD-10-CM | POA: Diagnosis not present

## 2017-01-09 DIAGNOSIS — J45909 Unspecified asthma, uncomplicated: Secondary | ICD-10-CM | POA: Insufficient documentation

## 2017-01-09 DIAGNOSIS — M79671 Pain in right foot: Secondary | ICD-10-CM | POA: Insufficient documentation

## 2017-01-09 DIAGNOSIS — Z6841 Body Mass Index (BMI) 40.0 and over, adult: Secondary | ICD-10-CM | POA: Insufficient documentation

## 2017-01-09 DIAGNOSIS — N183 Chronic kidney disease, stage 3 (moderate): Secondary | ICD-10-CM | POA: Insufficient documentation

## 2017-01-09 DIAGNOSIS — R7303 Prediabetes: Secondary | ICD-10-CM | POA: Insufficient documentation

## 2017-01-09 DIAGNOSIS — Z8673 Personal history of transient ischemic attack (TIA), and cerebral infarction without residual deficits: Secondary | ICD-10-CM | POA: Diagnosis not present

## 2017-01-09 DIAGNOSIS — I429 Cardiomyopathy, unspecified: Secondary | ICD-10-CM | POA: Insufficient documentation

## 2017-01-09 DIAGNOSIS — Z7984 Long term (current) use of oral hypoglycemic drugs: Secondary | ICD-10-CM | POA: Diagnosis not present

## 2017-01-09 DIAGNOSIS — F419 Anxiety disorder, unspecified: Secondary | ICD-10-CM | POA: Insufficient documentation

## 2017-01-09 DIAGNOSIS — I129 Hypertensive chronic kidney disease with stage 1 through stage 4 chronic kidney disease, or unspecified chronic kidney disease: Secondary | ICD-10-CM | POA: Diagnosis not present

## 2017-01-09 LAB — POCT GLYCOSYLATED HEMOGLOBIN (HGB A1C): Hemoglobin A1C: 6.1

## 2017-01-09 LAB — GLUCOSE, POCT (MANUAL RESULT ENTRY): POC Glucose: 133 mg/dl — AB (ref 70–99)

## 2017-01-09 MED ORDER — TRAMADOL HCL 50 MG PO TABS: 50.0000 mg | ORAL_TABLET | Freq: Three times a day (TID) | ORAL | 0 refills | Status: AC | PRN

## 2017-01-09 NOTE — Patient Instructions (Signed)
Foot Sprain A foot sprain is an injury to one of the strong bands of tissue (ligaments) that connect and support the many bones in your feet. The ligament can be stretched too much or it can tear. A tear can be either partial or complete. The severity of the sprain depends on how much of the ligament was damaged or torn. What are the causes? A foot sprain is usually caused by suddenly twisting or pivoting your foot. What increases the risk? This injury is more likely to occur in people who:  Play a sport, such as basketball or football.  Exercise or play a sport without warming up.  Start a new workout or sport.  Suddenly increase how long or hard they exercise or play a sport. What are the signs or symptoms? Symptoms of this condition start soon after an injury and include:  Pain, especially in the arch of the foot.  Bruising.  Swelling.  Inability to walk or use the foot to support body weight. How is this diagnosed? This condition is diagnosed with a medical history and physical exam. You may also have imaging tests, such as:  X-rays to make sure there are no broken bones (fractures).  MRI to see if the ligament has torn. How is this treated? Treatment varies depending on the severity of your sprain. Mild sprains can be treated with rest, ice, compression, and elevation (RICE). If your ligament is overstretched or partially torn, treatment usually involves keeping your foot in a fixed position (immobilization) for a period of time. To help you do this, your health care provider will apply a bandage, splint, or walking boot to keep your foot from moving until it heals. You may also be advised to use crutches or a scooter for a few weeks to avoid bearing weight on your foot while it is healing. If your ligament is fully torn, you may need surgery to reconnect the ligament to the bone. After surgery, a cast or splint will be applied and will need to stay on your foot while it  heals. Your health care provider may also suggest exercises or physical therapy to strengthen your foot. Follow these instructions at home: If You Have a Bandage, Splint, or Walking Boot:   Wear it as directed by your health care provider. Remove it only as directed by your health care provider.  Loosen the bandage, splint, or walking boot if your toes become numb and tingle, or if they turn cold and blue. Bathing   If your health care provider approves bathing and showering, cover the bandage or splint with a watertight plastic bag to protect it from water. Do not let the bandage or splint get wet. Managing pain, stiffness, and swelling   If directed, apply ice to the injured area:  Put ice in a plastic bag.  Place a towel between your skin and the bag.  Leave the ice on for 20 minutes, 2-3 times per day.  Move your toes often to avoid stiffness and to lessen swelling.  Raise (elevate) the injured area above the level of your heart while you are sitting or lying down. Driving   Do not drive or operate heavy machinery while taking pain medicine.  Ask your health care provider when it is safe to drive if you have a bandage, splint, or walking boot on your foot. Activity   Rest as directed by your health care provider.  Do not use the injured foot to support your body weight until   your health care provider says that you can. Use crutches or other supportive devices as directed by your health care provider.  Ask your health care provider what activities are safe for you. Gradually increase how much and how far you walk until your health care provider says it is safe to return to full activity.  Do any exercise or physical therapy as directed by your health care provider. General instructions   If a splint was applied, do not put pressure on any part of it until it is fully hardened. This may take several hours.  Take medicines only as directed by your health care provider.  These include over-the-counter medicines and prescription medicines.  Keep all follow-up visits as directed by your health care provider. This is important.  When you can walk without pain, wear supportive shoes that have stiff soles. Do not wear flip-flops, and do not walk barefoot. Contact a health care provider if:  Your pain is not controlled with medicine.  Your bruising or swelling gets worse or does not get better with treatment.  Your splint or walking boot is damaged. Get help right away if:  You develop severe numbness or tingling in your foot.  Your foot turns blue, white, or gray, and it feels cold. This information is not intended to replace advice given to you by your health care provider. Make sure you discuss any questions you have with your health care provider. Document Released: 04/18/2002 Document Revised: 04/03/2016 Document Reviewed: 08/30/2014 [employer] Interactive Patient Education  2017 [employer]  

## 2017-01-09 NOTE — Progress Notes (Signed)
Andrea Clements, is a 51 y.o. female  ZDG:[id number]  [id number]:[id number]  DOB - [birth year]  Subjective:  Chief Complaint and HPI: Andrea Clements is a 51 y.o. female here today for 3 day history of R foot pain with worsening when she bears weight.  Pain started acutely in the middle of the night.  Pain may have been precipitated by a recent move to a 3rd floor apartment 2 weeks ago.  Climbing stairs is a new activity for her.  NKI/twisting/fall. Tylenol not helping.  ROS:   Constitutional:  No f/c, No night sweats, No unexplained weight loss. EENT:  No vision changes, No blurry vision, No hearing changes. No mouth, throat, or ear problems.  Respiratory: No cough, No SOB Cardiac: No CP, no palpitations GI:  No abd pain, No N/V/D. GU: No Urinary s/sx Musculoskeletal: +R foot pain Neuro: No headache, no dizziness, no motor weakness.  Skin: No rash Endocrine:  No polydipsia. No polyuria.  Psych: Denies SI/HI  No problems updated.  ALLERGIES: Allergies  Allergen Reactions  . Altace [Ramipril] Anaphylaxis  . Aspirin Anaphylaxis    PAST MEDICAL HISTORY: Past Medical History:  Diagnosis Date  . Anxiety   . Arthritis   . Asthma   . Cardiomyopathy (HCC)    a. 09/2014 Echo: EF 30-35%, inflat, inf, infsept AK, Gr 2 DD-->No ischemic w/u 2/2 recent CVA and body habitus.  . CKD (chronic kidney disease), stage III   . CVA (cerebral infarction)    a. Acute sub cm LEFT basal ganglia/internal capsule lacunar infarct, normal MRA.  . Depression   . GERD (gastroesophageal reflux disease)   . Hypertension   . Morbid obesity (HCC)   . Pneumonia    history  . Stroke (HCC)   . Tobacco abuse     MEDICATIONS AT HOME: Prior to Admission medications   Medication Sig Start Date End Date Taking? Authorizing Provider  albuterol (PROVENTIL HFA;VENTOLIN HFA) 108 (90 BASE) MCG/ACT inhaler Inhale 2 puffs into the lungs every 6 (six) hours as needed for wheezing or shortness of breath. 10/10/14    Doris Cheadle, MD  amLODipine (NORVASC) 10 MG tablet TAKE ONE TABLET BY MOUTH ONCE DAILY 11/04/16   Tonny Bollman, MD  atorvastatin (LIPITOR) 40 MG tablet Take 1 tablet (40 mg total) by mouth daily. 03/18/16   Tonny Bollman, MD  chlorthalidone (HYGROTON) 25 MG tablet Take 1 tablet (25 mg total) by mouth daily. 03/18/16   Tonny Bollman, MD  clopidogrel (PLAVIX) 75 MG tablet TAKE 1 TABLET BY MOUTH ONCE DAILY WITH BREAKFAST. 11/05/16   Beatrice Lecher, PA-C  glimepiride (AMARYL) 1 MG tablet Take 1 tablet (1 mg total) by mouth daily with breakfast. 10/06/16   Pete Glatter, MD  hydrALAZINE (APRESOLINE) 25 MG tablet Take 25 mg by mouth daily.    Historical Provider, MD  isosorbide mononitrate (IMDUR) 30 MG 24 hr tablet Take 1 tablet (30 mg total) by mouth daily. 10/31/15   Beatrice Lecher, PA-C  metoprolol succinate (TOPROL-XL) 100 MG 24 hr tablet Take 1 tablet (100 mg total) by mouth daily. Take with or immediately following a meal. 10/31/15   Beatrice Lecher, PA-C  pantoprazole (PROTONIX) 40 MG tablet TAKE ONE TABLET BY MOUTH ONCE DAILY 11/17/16   Tonny Bollman, MD  potassium chloride SA (K-DUR,KLOR-CON) 20 MEQ tablet Take 20 mEq by mouth 2 (two) times daily.    Historical Provider, MD  spironolactone (ALDACTONE) 25 MG tablet Take 1 tablet (25 mg total) by mouth daily.  07/30/16   Tonny Bollman, MD  traMADol (ULTRAM) 50 MG tablet Take 1 tablet (50 mg total) by mouth every 8 (eight) hours as needed. 01/09/17   Anders Simmonds, PA-C  Vitamin D, Ergocalciferol, (DRISDOL) 50000 units CAPS capsule Take 1 capsule (50,000 Units total) by mouth every 7 (seven) days. Patient not taking: Reported on 01/09/2017 10/13/16   Pete Glatter, MD     Objective:  EXAM:   Vitals:   01/09/17 0936  BP: 139/84  Pulse: 69  Resp: 16  Temp: 98 F (36.7 C)  TempSrc: Oral  SpO2: 99%  Weight: 280 lb 12.8 oz (127.4 kg)    General appearance : A&OX3. NAD. Non-toxic-appearing HEENT: Atraumatic and Normocephalic.  PERRLA.  EOM intact.  TM clear B. Mouth-MMM, post pharynx WNL w/o erythema, No PND. Neck: supple, no JVD. No cervical lymphadenopathy. No thyromegaly Chest/Lungs:  Breathing-non-labored, Good air entry bilaterally, breath sounds normal without rales, rhonchi, or wheezing  CVS: S1 S2 regular, no murmurs, gallops, rubs  Extremities: Bilateral Lower Ext shows no edema, both legs are warm to touch with = pulse throughout. B feet examined.  R foot exquisitely tender over the proximal 4th/5th metatarsal with minimal swelling present.  N-V in tact/.  No other point tenderness Neurology:  CN II-XII grossly intact, Non focal.   Psych:  TP linear. J/I WNL. Normal speech. Appropriate eye contact and affect.  Skin:  No Rash  Data Review Lab Results  Component Value Date   HGBA1C 6.1 01/09/2017   HGBA1C 6.3 10/06/2016   HGBA1C 6.4 03/17/2016     Assessment & Plan   1. Prediabetes Improved over last A1C.  Continue current regimen and continue to work on [employer] and healthier lifestyle choices. - POCT glucose (manual entry) - POCT glycosylated hemoglobin (Hb A1C)  2. Right foot pain RICE therapy Rx Post op Shoe Tramadol for pain in addition to Tylenol - DG Foot Complete Right; Future   Patient have been counseled extensively about nutrition and exercise  Return in about 4 weeks (around 02/06/2017) for Dr Julien Nordmann for 3-4 month f/up/DM.  The patient was given clear instructions to go to ER or return to medical center if symptoms don't improve, worsen or new problems develop. The patient verbalized understanding. The patient was told to call to get lab results if they haven't heard anything in the next week.     Georgian Co, PA-C [facility] [place], [place] [phone]   01/09/2017, 10:13 AMPatient ID: Andrea Clements, female   DOB: February 14, 1966, 51 y.o.   MRN: [id number]

## 2017-01-14 ENCOUNTER — Telehealth: Payer: Self-pay

## 2017-01-14 NOTE — Telephone Encounter (Signed)
Contacted pt to go over xray results lvm asking pt to give me a call at her earliest convenience   If pt calls back please give results: No fracture was seen. Wear the shoe for the next 2 weeks and treat this as a sprain as we discussed

## 2017-02-03 ENCOUNTER — Other Ambulatory Visit: Payer: Self-pay | Admitting: Physician Assistant

## 2017-02-03 MED FILL — CHLORTHALIDONE 25 MG TABLET: 25 | 30 days supply | Qty: 30 | Fill #8

## 2017-02-03 MED FILL — CLOPIDOGREL 75 MG TABLET: 75 | 30 days supply | Qty: 30 | Fill #2 | Status: TO

## 2017-02-03 MED FILL — ATORVASTATIN 40 MG TABLET: 40 | 30 days supply | Qty: 30 | Fill #8

## 2017-02-03 MED FILL — CALCITRIOL 0.25 MCG CAPSULE: 0.25 | 30 days supply | Qty: 16 | Fill #3

## 2017-02-03 MED FILL — SPIRONOLACTONE 25 MG TABLET: 25 | 30 days supply | Qty: 30 | Fill #1 | Status: TO

## 2017-02-09 MED FILL — hydrALAZINE HCL 25 MG TABS: 25 | 30 days supply | Qty: 30 | Fill #0

## 2017-02-25 ENCOUNTER — Other Ambulatory Visit: Payer: Self-pay | Admitting: Physician Assistant

## 2017-02-26 MED FILL — METOPROLOL SUCC ER 100 MG T: 100 | 90 days supply | Qty: 90 | Fill #0 | Status: TO

## 2017-02-26 MED FILL — ALLOPURINOL 100 MG TABLET: 100 | 30 days supply | Qty: 60 | Fill #0 | Status: TO

## 2017-02-26 MED FILL — COLCHICINE 0.6 MG TABLET: 0.6 | 30 days supply | Qty: 30 | Fill #0 | Status: TO

## 2017-03-03 MED FILL — CLOPIDOGREL 75 MG TABLET: 75 | 30 days supply | Qty: 30 | Fill #3 | Status: TO

## 2017-03-18 MED FILL — CHLORTHALIDONE 25 MG TABLET: 25 | 30 days supply | Qty: 30 | Fill #9

## 2017-03-18 MED FILL — ATORVASTATIN 40 MG TABLET: 40 | 30 days supply | Qty: 30 | Fill #9

## 2017-03-18 MED FILL — ISOSORBIDE MN ER 30 MG TAB: 30 | 30 days supply | Qty: 30 | Fill #3

## 2017-03-20 MED FILL — hydrALAZINE HCL 25 MG TABS: 25 | 30 days supply | Qty: 30 | Fill #1

## 2017-03-20 MED FILL — SPIRONOLACTONE 25 MG TABLET: 25 | 30 days supply | Qty: 30 | Fill #2 | Status: TO

## 2017-03-20 MED FILL — CALCITRIOL 0.25 MCG CAPSULE: 0.25 | 30 days supply | Qty: 16 | Fill #4

## 2017-03-26 ENCOUNTER — Encounter: Payer: Self-pay | Admitting: Internal Medicine

## 2017-03-27 ENCOUNTER — Encounter: Payer: Self-pay | Admitting: Internal Medicine

## 2017-03-30 ENCOUNTER — Encounter: Payer: Self-pay | Admitting: Internal Medicine

## 2017-04-30 ENCOUNTER — Other Ambulatory Visit: Payer: Self-pay | Admitting: Cardiovascular Disease

## 2017-04-30 MED FILL — COLCHICINE 0.6 MG TABLET: 0.6 | 30 days supply | Qty: 30 | Fill #1 | Status: TO

## 2017-04-30 MED FILL — CLOPIDOGREL 75 MG TABLET: 75 | 30 days supply | Qty: 30 | Fill #4 | Status: TO

## 2017-04-30 MED FILL — ALLOPURINOL 100 MG TABLET: 100 | 30 days supply | Qty: 60 | Fill #1 | Status: TO

## 2017-04-30 MED FILL — ISOSORBIDE MN ER 30 MG TAB: 30 | 30 days supply | Qty: 30 | Fill #4

## 2017-05-01 MED FILL — CHLORTHALIDONE 25 MG TABLET: 25 | 30 days supply | Qty: 30 | Fill #0 | Status: TO

## 2017-05-01 MED FILL — ATORVASTATIN 40 MG TABLET: 40 | 30 days supply | Qty: 30 | Fill #0 | Status: TO

## 2017-05-07 MED FILL — hydrALAZINE HCL 25 MG TABS: 25 | 30 days supply | Qty: 30 | Fill #2

## 2017-05-07 MED FILL — ?SPIRONOLACTONE 25 MG TABLE: 25 | 30 days supply | Qty: 30 | Fill #3 | Status: TO

## 2017-06-08 ENCOUNTER — Other Ambulatory Visit: Payer: Self-pay | Admitting: Physician Assistant

## 2017-06-09 MED FILL — CLOPIDOGREL 75 MG TABLET: 75 | 30 days supply | Qty: 30 | Fill #5 | Status: TO

## 2017-06-09 MED FILL — ISOSORBIDE MN ER 30 MG TAB: 30 | 30 days supply | Qty: 30 | Fill #5

## 2017-06-09 MED FILL — ?SPIRONOLACTONE 25 MG TABLE: 25 | 30 days supply | Qty: 30 | Fill #4 | Status: TO

## 2017-06-09 MED FILL — COLCHICINE 0.6 MG TABLET: 0.6 | 30 days supply | Qty: 30 | Fill #2 | Status: TO

## 2017-06-18 MED FILL — hydrALAZINE HCL 25 MG TABS: 25 | 30 days supply | Qty: 30 | Fill #3 | Status: TO

## 2017-06-18 MED FILL — ATORVASTATIN 40 MG TABLET: 40 | 30 days supply | Qty: 30 | Fill #1 | Status: TO

## 2017-06-18 MED FILL — ALLOPURINOL 100 MG TABLET: 100 | 30 days supply | Qty: 60 | Fill #2 | Status: TO

## 2017-06-18 MED FILL — CHLORTHALIDONE 25 MG TABLET: 25 | 30 days supply | Qty: 30 | Fill #1 | Status: TO

## 2017-07-02 ENCOUNTER — Encounter (HOSPITAL_COMMUNITY): Payer: Self-pay | Admitting: Nurse Practitioner

## 2017-07-02 ENCOUNTER — Ambulatory Visit (HOSPITAL_COMMUNITY)
Admission: EM | Admit: 2017-07-02 | Discharge: 2017-07-02 | Disposition: A | Discharge status: Home / Self Care | Payer: Medicaid Other | Attending: Family Medicine | Admitting: Family Medicine

## 2017-07-02 DIAGNOSIS — Z8673 Personal history of transient ischemic attack (TIA), and cerebral infarction without residual deficits: Secondary | ICD-10-CM | POA: Insufficient documentation

## 2017-07-02 DIAGNOSIS — J45909 Unspecified asthma, uncomplicated: Secondary | ICD-10-CM | POA: Diagnosis not present

## 2017-07-02 DIAGNOSIS — F329 Major depressive disorder, single episode, unspecified: Secondary | ICD-10-CM | POA: Insufficient documentation

## 2017-07-02 DIAGNOSIS — I429 Cardiomyopathy, unspecified: Secondary | ICD-10-CM | POA: Insufficient documentation

## 2017-07-02 DIAGNOSIS — Z9889 Other specified postprocedural states: Secondary | ICD-10-CM | POA: Insufficient documentation

## 2017-07-02 DIAGNOSIS — N390 Urinary tract infection, site not specified: Secondary | ICD-10-CM | POA: Diagnosis not present

## 2017-07-02 DIAGNOSIS — Z8249 Family history of ischemic heart disease and other diseases of the circulatory system: Secondary | ICD-10-CM | POA: Diagnosis not present

## 2017-07-02 DIAGNOSIS — Z888 Allergy status to other drugs, medicaments and biological substances status: Secondary | ICD-10-CM | POA: Diagnosis not present

## 2017-07-02 DIAGNOSIS — Z803 Family history of malignant neoplasm of breast: Secondary | ICD-10-CM | POA: Diagnosis not present

## 2017-07-02 DIAGNOSIS — R339 Retention of urine, unspecified: Secondary | ICD-10-CM

## 2017-07-02 DIAGNOSIS — I129 Hypertensive chronic kidney disease with stage 1 through stage 4 chronic kidney disease, or unspecified chronic kidney disease: Secondary | ICD-10-CM | POA: Diagnosis not present

## 2017-07-02 DIAGNOSIS — Z79899 Other long term (current) drug therapy: Secondary | ICD-10-CM | POA: Insufficient documentation

## 2017-07-02 DIAGNOSIS — F419 Anxiety disorder, unspecified: Secondary | ICD-10-CM | POA: Insufficient documentation

## 2017-07-02 DIAGNOSIS — N183 Chronic kidney disease, stage 3 (moderate): Secondary | ICD-10-CM | POA: Diagnosis not present

## 2017-07-02 DIAGNOSIS — Z90710 Acquired absence of both cervix and uterus: Secondary | ICD-10-CM | POA: Diagnosis not present

## 2017-07-02 DIAGNOSIS — K219 Gastro-esophageal reflux disease without esophagitis: Secondary | ICD-10-CM | POA: Insufficient documentation

## 2017-07-02 LAB — POCT URINALYSIS DIP (DEVICE)
Bilirubin Urine: NEGATIVE
GLUCOSE, UA: NEGATIVE mg/dL
Hgb urine dipstick: NEGATIVE
Ketones, ur: NEGATIVE mg/dL
Leukocytes, UA: NEGATIVE
Nitrite: POSITIVE — AB
PROTEIN: 30 mg/dL — AB
Specific Gravity, Urine: 1.02 (ref 1.005–1.030)
UROBILINOGEN UA: 0.2 mg/dL (ref 0.0–1.0)
pH: 5.5 (ref 5.0–8.0)

## 2017-07-02 MED ORDER — CEPHALEXIN 500 MG PO CAPS: 500.0000 mg | ORAL_CAPSULE | Freq: Four times a day (QID) | ORAL | 0 refills | Status: AC

## 2017-07-02 NOTE — ED Notes (Signed)
Straight cath by Annette Stable NP, pt tolerated well and feels that her bladder is empty now

## 2017-07-02 NOTE — ED Triage Notes (Signed)
Pt presents with c/o urinary retention. She woke this morning and tried to urinate but was not able. She feels a fullness in her bladder and remains unable to void. She denies any past history of kidney stones, urinary tract infections. She is in stage 4 kidney disease but reports normal urination prior to today

## 2017-07-02 NOTE — Discharge Instructions (Signed)
The urine removed from the catheter was 250 ml which is not a lot of residual and should be able to void with higher volume.  You have a UTI which could be contributing to being unable to urinate this AM.  If having anymore difficulty please go to ED where a Foley catheter can be inserted with a bedside bag.  Again this should resolve with treatment of your UTI.

## 2017-07-02 NOTE — ED Provider Notes (Signed)
[facility]   [id number] 07/02/17 Arrival Time: 7517  ASSESSMENT & PLAN:  1. Urinary retention   2. Lower urinary tract infectious disease     Meds ordered this encounter  Medications  . allopurinol (ZYLOPRIM) 100 MG tablet    Sig: Take 200 mg by mouth daily.  . colchicine 0.6 MG tablet    Sig: Take 0.6 mg by mouth daily.  . calcitRIOL (ROCALTROL) 0.25 MCG capsule    Sig: Take 0.25 mcg by mouth every other day.  . cephALEXin (KEFLEX) 500 MG capsule    Sig: Take 1 capsule (500 mg total) by mouth 4 (four) times daily.    Dispense:  20 capsule    Refill:  0    Order Specific Question:   Supervising Provider    Answer:   Domenick Gong [[id number]]    Reviewed expectations re: course of current medical issues. Questions answered. Outlined signs and symptoms indicating need for more acute intervention. Patient verbalized understanding. After Visit Summary given.  In and out Cath produced 250 ml of urine.  Explained to patient that UTI is making her feel like she cannot void however her residual UA volume was not high and her sx's are from UTI which we are treating with abx's and this should resolve her sx's. SUBJECTIVE:  Andrea Clements is a 51 y.o. female who presents with complaint of difficulty with voiding this am.  She states she feels like she needs to void but cannot.  ROS: As per HPI.   OBJECTIVE:  Vitals:   07/02/17 1013  BP: (!) 161/87  Pulse: 75  Resp: 16  Temp: 98.9 F (37.2 C)  TempSrc: Oral  SpO2: 97%     General appearance: alert; no distress Eyes: PERRLA; EOMI; conjunctiva normal HENT: normocephalic; atraumatic; Lungs: clear to auscultation bilaterally Heart: regular rate and rhythm Abdomen: soft, non-tender; bowel sounds normal; no masses or organomegaly; no guarding or rebound tenderness Back: no CVA tenderness Psychological: alert and cooperative; normal mood and affect  Past Medical History:  Diagnosis Date  . Anxiety   .  Arthritis   . Asthma   . Cardiomyopathy (HCC)    a. 09/2014 Echo: EF 30-35%, inflat, inf, infsept AK, Gr 2 DD-->No ischemic w/u 2/2 recent CVA and body habitus.  . CKD (chronic kidney disease), stage III   . CVA (cerebral infarction)    a. Acute sub cm LEFT basal ganglia/internal capsule lacunar infarct, normal MRA.  . Depression   . GERD (gastroesophageal reflux disease)   . Hypertension   . Morbid obesity (HCC)   . Pneumonia    history  . Stroke (HCC)   . Tobacco abuse      has a past medical history of Anxiety; Arthritis; Asthma; Cardiomyopathy (HCC); CKD (chronic kidney disease), stage III; CVA (cerebral infarction); Depression; GERD (gastroesophageal reflux disease); Hypertension; Morbid obesity (HCC); Pneumonia; Stroke [facility]); and Tobacco abuse.    Labs Reviewed  POCT URINALYSIS DIP (DEVICE) - Abnormal; Notable for the following:       Result Value   Protein, ur 30 (*)    Nitrite POSITIVE (*)    All other components within normal limits  URINE CULTURE    Imaging: No results found.  Allergies  Allergen Reactions  . Altace [Ramipril] Anaphylaxis  . Aspirin Anaphylaxis    Family History  Problem Relation Age of Onset  . Hypertension Mother   . Cancer Mother   . Hypertension Father   . Hypertension Maternal Grandfather   .  Hypertension Paternal Grandmother   . Hypertension Paternal Grandfather   . Hypertension Maternal Grandmother   . Breast cancer Cousin    Past Surgical History:  Procedure Laterality Date  . ABDOMINAL HYSTERECTOMY    . AV FISTULA PLACEMENT Left 07/23/2016   Procedure: ARTERIOVENOUS (AV) FISTULA CREATION LEFT BRACHIOCEPHALIC;  Surgeon: Nada Libman, MD;  Location: [facility] OR;  Service: Vascular;  Laterality: Left;  . CESAREAN SECTION           Deatra Canter, [place] 07/02/17 1110

## 2017-07-03 LAB — URINE CULTURE

## 2017-07-22 ENCOUNTER — Other Ambulatory Visit: Payer: Self-pay | Admitting: Physician Assistant

## 2017-07-22 MED FILL — COLCHICINE 0.6 MG TABLET: 0.6 | 30 days supply | Qty: 30 | Fill #3 | Status: TO

## 2017-07-22 MED FILL — CLOPIDOGREL 75 MG TABLET: 75 | 30 days supply | Qty: 30 | Fill #6 | Status: TO

## 2017-08-06 ENCOUNTER — Other Ambulatory Visit: Payer: Self-pay | Admitting: Cardiovascular Disease

## 2017-08-10 ENCOUNTER — Ambulatory Visit: Payer: Self-pay | Admitting: Internal Medicine

## 2017-09-02 ENCOUNTER — Other Ambulatory Visit: Payer: Self-pay | Admitting: Physician Assistant

## 2017-11-12 ENCOUNTER — Telehealth: Payer: Self-pay | Admitting: Cardiovascular Disease

## 2017-11-12 NOTE — Telephone Encounter (Signed)
New Message   Patient is calling to request a referral to a cardiologist in [place], [place]. She will be moving to [place]. Please call.

## 2017-11-13 NOTE — Telephone Encounter (Signed)
Laurey Morale, MD  Henrietta Dine, RN        Dr. Constance Haw with the [facility] Cardiology practice in [facility]       Informed patient of MD recommendation. She was grateful for assistance.

## 2017-12-24 ENCOUNTER — Ambulatory Visit: Payer: Self-pay | Admitting: Cardiovascular Disease

## 2018-01-31 ENCOUNTER — Other Ambulatory Visit: Payer: Self-pay | Admitting: Physician Assistant

## 2018-02-22 ENCOUNTER — Other Ambulatory Visit: Payer: Self-pay | Admitting: Physician Assistant

## 2018-03-10 ENCOUNTER — Other Ambulatory Visit: Payer: Self-pay | Admitting: Physician Assistant

## 2018-03-10 DIAGNOSIS — I6389 Other cerebral infarction: Secondary | ICD-10-CM

## 2018-06-08 IMAGING — CR DG CHEST 2V
2 series · 2 of 2 positions shown · non-contrast
Comparison: 07/25/2015 .

CLINICAL DATA: Shortness of breath .

EXAM:
CHEST  2 VIEW

[w chest pa]
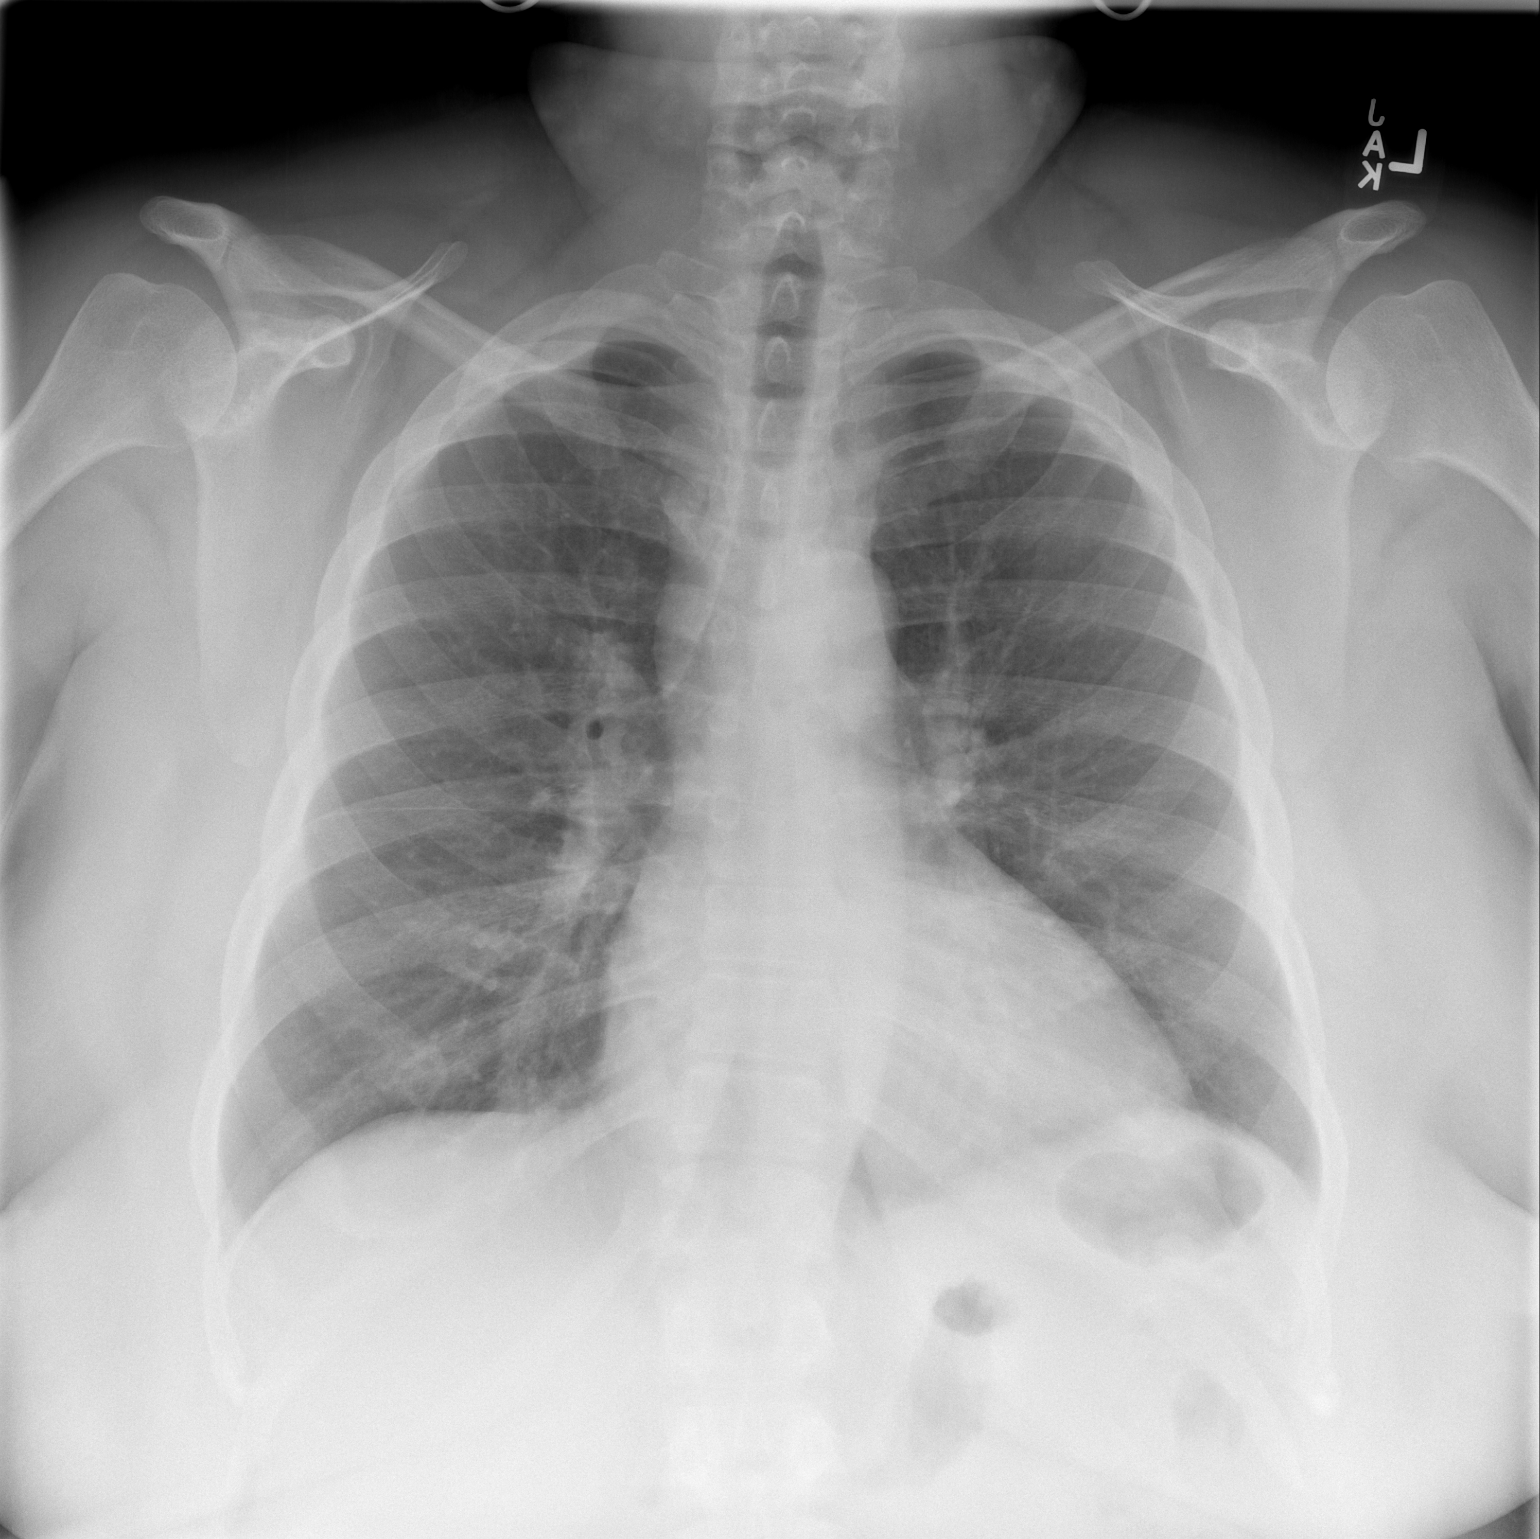

[w chest lat]
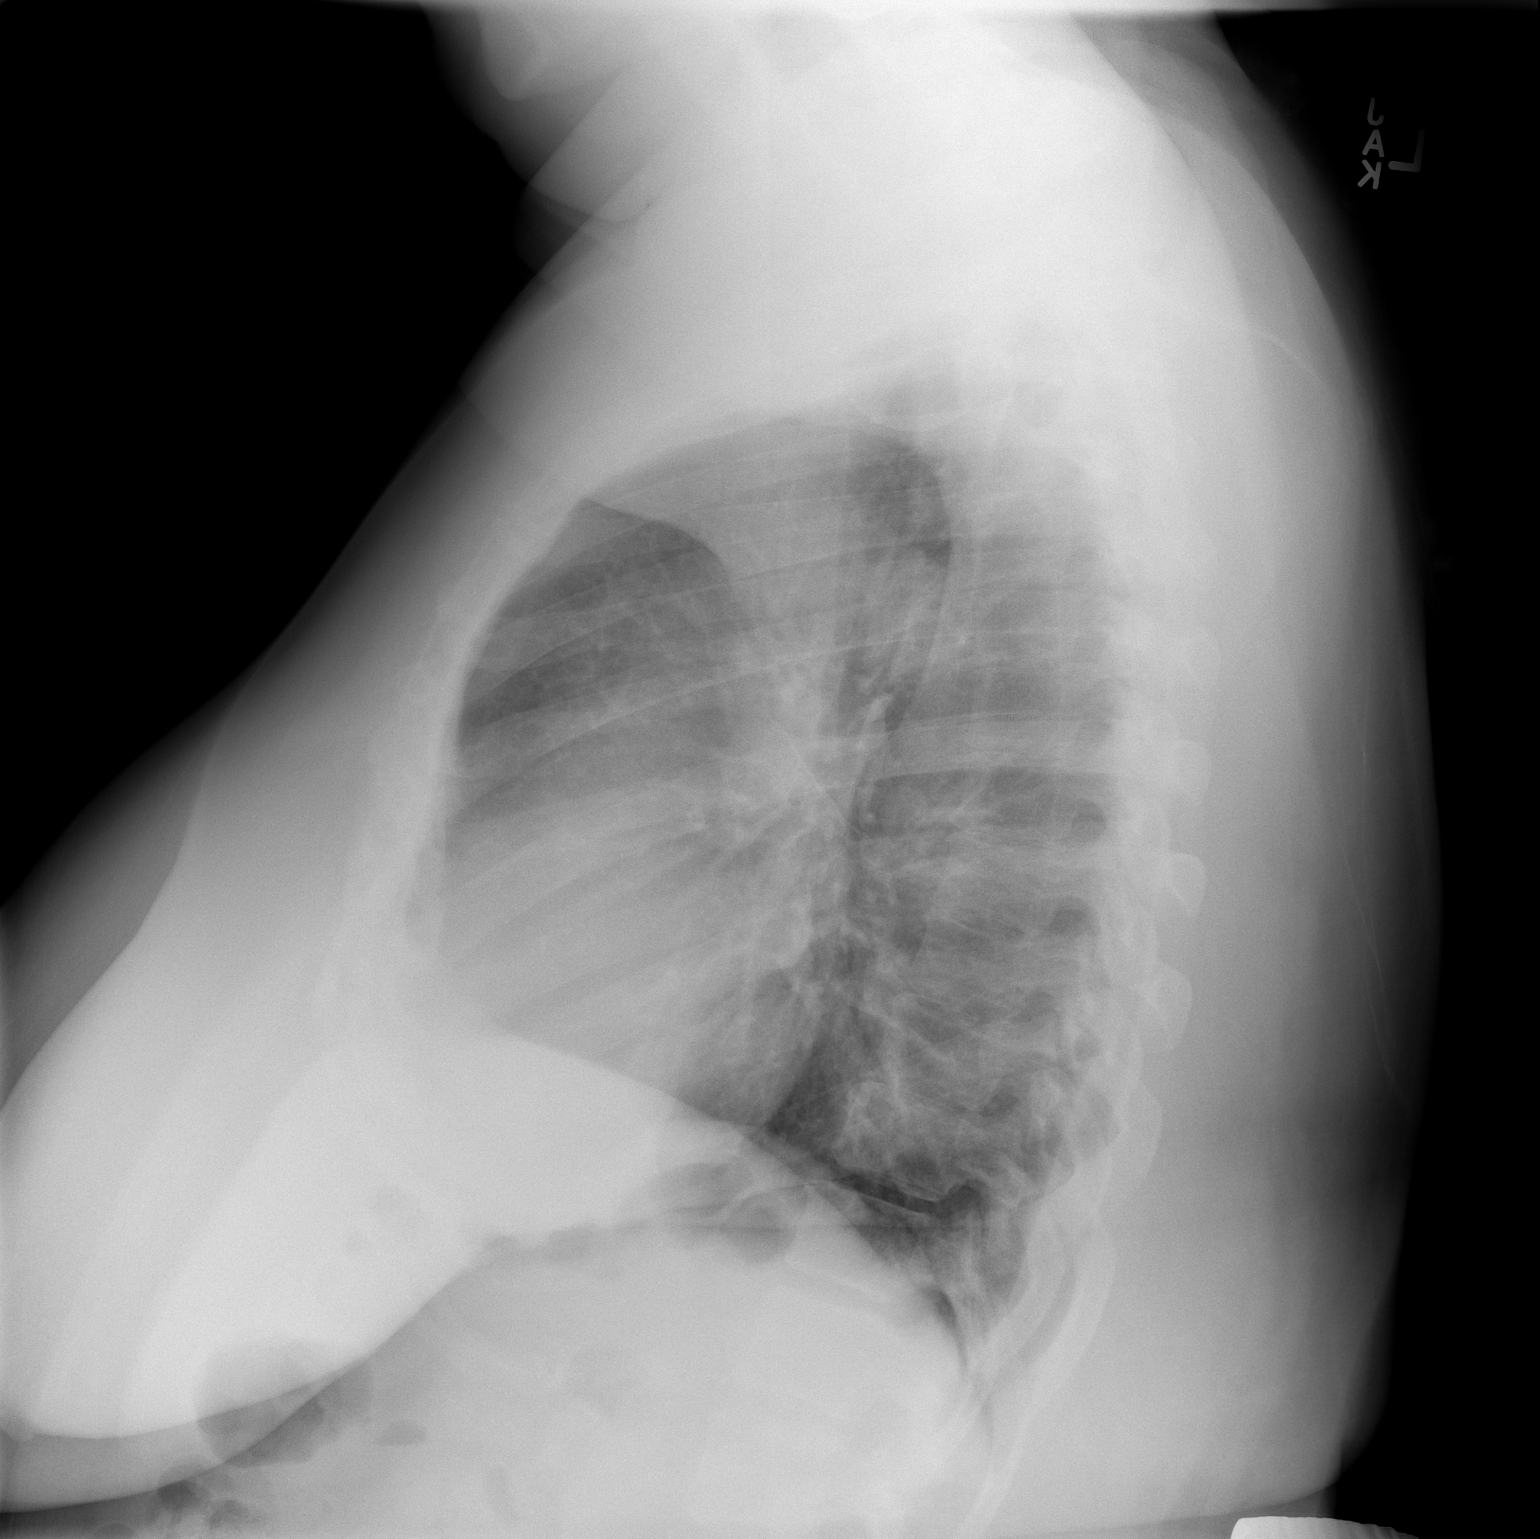

[2 of 2 positions shown; findings below may reference images not displayed]

FINDINGS: Mediastinum hilar structures normal. Heart size stable. No evidence
of focal infiltrate. No pleural effusion or pneumothorax.
Degenerative changes thoracic spine.
IMPRESSION: No acute cardiopulmonary disease.
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
